# Patient Record
Sex: Male | Born: 1970 | Race: White | Hispanic: No | Marital: Married | Smoking: Current every day smoker
Health system: Southern US, Community
[De-identification: ages and names within clinical notes are randomized; demographics above are authoritative.]

## PROBLEM LIST (undated history)

## (undated) DIAGNOSIS — E785 Hyperlipidemia, unspecified: Secondary | ICD-10-CM

## (undated) DIAGNOSIS — J45909 Unspecified asthma, uncomplicated: Secondary | ICD-10-CM

## (undated) DIAGNOSIS — I10 Essential (primary) hypertension: Secondary | ICD-10-CM

## (undated) DIAGNOSIS — K219 Gastro-esophageal reflux disease without esophagitis: Secondary | ICD-10-CM

## (undated) DIAGNOSIS — S0590XA Unspecified injury of unspecified eye and orbit, initial encounter: Secondary | ICD-10-CM

## (undated) DIAGNOSIS — I252 Old myocardial infarction: Secondary | ICD-10-CM

## (undated) DIAGNOSIS — I2510 Atherosclerotic heart disease of native coronary artery without angina pectoris: Secondary | ICD-10-CM

## (undated) DIAGNOSIS — Z720 Tobacco use: Secondary | ICD-10-CM

## (undated) DIAGNOSIS — F1410 Cocaine abuse, uncomplicated: Secondary | ICD-10-CM

## (undated) DIAGNOSIS — J449 Chronic obstructive pulmonary disease, unspecified: Secondary | ICD-10-CM

## (undated) HISTORY — DX: Atherosclerotic heart disease of native coronary artery without angina pectoris: I25.10

## (undated) HISTORY — DX: Hyperlipidemia, unspecified: E78.5

## (undated) HISTORY — DX: Tobacco use: Z72.0

## (undated) HISTORY — DX: Essential (primary) hypertension: I10

## (undated) HISTORY — PX: EYE SURGERY: SHX253

## (undated) HISTORY — PX: KNEE ARTHROSCOPY: SUR90

## (undated) HISTORY — DX: Cocaine abuse, uncomplicated: F14.10

## (undated) HISTORY — DX: Gastro-esophageal reflux disease without esophagitis: K21.9

## (undated) HISTORY — DX: Unspecified injury of unspecified eye and orbit, initial encounter: S05.90XA

---

## 1990-06-26 HISTORY — PX: NISSEN FUNDOPLICATION: SHX2091

## 1998-07-19 ENCOUNTER — Emergency Department (HOSPITAL_COMMUNITY): Admission: EM | Admit: 1998-07-19 | Discharge: 1998-07-19 | Payer: Self-pay | Admitting: Emergency Medicine

## 2006-02-12 ENCOUNTER — Ambulatory Visit: Payer: Self-pay | Admitting: Cardiology

## 2006-02-12 ENCOUNTER — Observation Stay (HOSPITAL_COMMUNITY): Admission: EM | Admit: 2006-02-12 | Discharge: 2006-02-13 | Payer: Self-pay | Admitting: Emergency Medicine

## 2006-02-14 ENCOUNTER — Ambulatory Visit: Payer: Self-pay | Admitting: Cardiology

## 2006-06-14 ENCOUNTER — Ambulatory Visit: Payer: Self-pay | Admitting: Cardiology

## 2007-05-21 ENCOUNTER — Emergency Department (HOSPITAL_COMMUNITY): Admission: EM | Admit: 2007-05-21 | Discharge: 2007-05-21 | Payer: Self-pay | Admitting: Emergency Medicine

## 2007-06-14 ENCOUNTER — Encounter (HOSPITAL_COMMUNITY): Admission: RE | Admit: 2007-06-14 | Discharge: 2007-06-26 | Payer: Self-pay | Admitting: Orthopaedic Surgery

## 2007-06-27 HISTORY — PX: APPENDECTOMY: SHX54

## 2007-07-30 ENCOUNTER — Ambulatory Visit (HOSPITAL_COMMUNITY): Admission: RE | Admit: 2007-07-30 | Discharge: 2007-07-30 | Payer: Self-pay | Admitting: Orthopaedic Surgery

## 2007-09-18 ENCOUNTER — Emergency Department (HOSPITAL_COMMUNITY): Admission: EM | Admit: 2007-09-18 | Discharge: 2007-09-18 | Payer: Self-pay | Admitting: Emergency Medicine

## 2008-01-11 ENCOUNTER — Emergency Department (HOSPITAL_COMMUNITY): Admission: EM | Admit: 2008-01-11 | Discharge: 2008-01-11 | Payer: Self-pay | Admitting: Emergency Medicine

## 2009-06-26 HISTORY — PX: CORONARY STENT PLACEMENT: SHX1402

## 2009-11-10 DIAGNOSIS — I2510 Atherosclerotic heart disease of native coronary artery without angina pectoris: Secondary | ICD-10-CM

## 2009-11-10 HISTORY — DX: Atherosclerotic heart disease of native coronary artery without angina pectoris: I25.10

## 2010-11-11 NOTE — H&P (Signed)
NAMEISSA, KOSMICKI             ACCOUNT NO.:  [id number]   MEDICAL RECORD NO.:  [id number]          PATIENT TYPE:  INP   LOCATION:  A201                          FACILITY:  [facility]   PHYSICIAN:  Margaretmary Dys, M.D.DATE OF BIRTH:  1970-10-11   DATE OF ADMISSION:  02/12/2006  DATE OF DISCHARGE:  LH                                HISTORY & PHYSICAL   PRIMARY CARE PHYSICIAN:  The patient is unassigned.   ADMISSION DIAGNOSES:  1. Chest pain, rule out myocardial infarction.  2. Tobacco abuse.  3. Probable chronic obstructive pulmonary disease.   CHIEF COMPLAINT:  Chest tightness over the past 3 days.   HISTORY OF PRESENT ILLNESS:  Ms. Handshoe is a 40 year old Caucasian male who  was been admitted through the emergency room after he presented with chest  pain.  The patient describes some chest tightness that is pressure-like,  like someone sitting on his chest, which he rates as a 10/10.  The onset was  about 3 years ago and he was at the time working on a roof.  The patient  works in [employer].  The patient said it was a little difficult for him  to take a deep breath as symptoms were worse.  He denies any wheezing but  has had occasional wheezing in the past.  The patient reports having a fair  amount of cough in the morning almost daily, productive of sputum,  occasionally white and greenish sputum.  He denies any blood.  He has had no  fever or chills.   He denies any diaphoresis.  His pain is nonradiating.  He has had no nausea  or vomiting.  He denies any heartburn-like symptoms.  Initial evaluation in  the emergency room was really unremarkable, but there are concerns due to  his strong family history, and the patient is now being admitted.  He does  also have a fair amount of smoking history.  The patient told me that he may  have been told in the past that he had COPD but never did anything about it.  He was never prescribed inhalers.  The patient is now being admitted  for the  evaluation and a stress test and, if negative, probably pulmonary function  tests to rule out the role of COPD in all of this.   REVIEW OF SYSTEMS:  A 10-point review of systems is otherwise negative  except as mentioned in the history of present illness.   PAST MEDICAL HISTORY:  1. Tobacco abuse.  2. Questionable diagnosis of COPD some years ago.   MEDICATIONS:  None.   ALLERGIES:  No known drug allergies.   FAMILY HISTORY:  Fairly positive.  Father had an MI in his 35s.  He also has  a brother who also had a myocardial infarction.  There is also a family  history of hypertension and coronary artery disease in the grandmother also  at an early age, in her 50s.   SOCIAL HISTORY:  The patient is divorced, has 3 children.  He lives with his  fiancee.  He is independent of his activities of daily living.  He works in  [employer].  He smokes about 2 packs of cigarettes a day and also  occasional marijuana.  The patient denies any further illicit drug use,  including cocaine or IV drug abuse.   PHYSICAL EXAMINATION:  GENERAL:  The patient is alert, comfortable, not in  acute distress.  VITAL SIGNS:  Blood pressure was 146/88, pulse of 72, respirations was 20, T-  max 97.9.  Oxygen saturation was 98% on room air.  HEENT:  Normocephalic, atraumatic.  Oral mucosa was moist without exudate.  NECK:  Supple, no JVD or lymphadenopathy.  LUNGS:  Clear clinically with good air entry bilaterally.  No crackles, no  wheezing, no rhonchi were heard.  CARDIAC:  S1, S2, regular.  No S3, S4, gallops or rubs.  ABDOMEN:  Soft, nontender, bowel sounds were positive.  No masses palpable.  EXTREMITIES:  No pitting pedal edema.  No calf induration or tenderness was  noted.  CENTRAL NERVOUS SYSTEM:  Grossly intact with no focal deficit.   LABORATORY/DIAGNOSTIC DATA:  Chest x-ray showed no acute findings.  A 12-  lead EKG was normal sinus rhythm with no acute ST-T changes.  White blood  cells  9.8, hemoglobin of 15.1, hematocrit 43.6, platelet count was 242 with  no left shift.  Sodium was 142, potassium of 4, chloride of 108, CO2 27,  glucose of 89, BUN of 11, creatinine was 1.0, AST of 19, ALT of 21.  Cardiac  enzymes were negative.   ASSESSMENT AND PLAN:  Ms. Gavin is a 40 year old Caucasian male with chest  pain.  His chest pain, I think, is a little atypical, and I think his  overall probability of this as angina is pretty low.  The patient reports a  strong family history and also a strong family history.  I still think the  probability is low.   Plans is to admit him.  We will cycle his enzymes, put him on telemetry.  We  will give him morphine p.r.n. for pain control.  Of note, the patient said  he received some nitroglycerin, which did not really help with the pressure.   I think the patient may also have a fair amount of chronic obstructive  pulmonary disease as evidenced by his daily cough and sputum production in  the mornings as well as his smoker's cough.  I am going to request  cardiology and if they think it is necessary, he may need a stress test.  If  the stress test is negative, I think he will need pulmonary function tests  to further define his lung condition.  I will put him on albuterol and  Atrovent nebulizer as needed.  If the patient remains mostly hemodynamically  stable, I will not fully anticoagulate him or start him on a nitroglycerin  infusion at this time, again as I think the overall probability is a little  low.  I have discussed the above plan with him and he verbalized full  understanding.      Margaretmary Dys, M.D.  Electronically Signed     AM/MEDQ  D:  02/13/2006  T:  02/13/2006  Job:  [id number]

## 2010-11-11 NOTE — Consult Note (Signed)
Bradley Hays, Bradley Hays             ACCOUNT NO.:  [id number]   MEDICAL RECORD NO.:  [id number]          PATIENT TYPE:  INP   LOCATION:  A201                          FACILITY:  [facility]   PHYSICIAN:  Gerrit Friends. Dietrich Pates, MD, FACCDATE OF BIRTH:  July 10, 1970   DATE OF CONSULTATION:  02/13/2006  DATE OF DISCHARGE:  02/13/2006                                   CONSULTATION   REFERRING PHYSICIAN:  Margaretmary Dys, M.D.   HISTORY OF PRESENT ILLNESS:  A 40 year old gentleman with generally good  health presents with chest discomfort.  Bradley Hays has had little contact  with physicians.  He has never been evaluated by cardiologist nor required  any cardiac testing.  He has been hospitalized only once for a  fundoplication for severe GERD.  He developed moderately-severe anterior  chest pressure at work yesterday, which persisted for number of hours  prompting presentation to [facility] emergency department from whence he was  admitted.  Initial cardiac markers and EKGs are negative.  There is no  radiation of his discomfort.  There is no associated dyspnea, diaphoresis  nor nausea.  There is no relationship to exertion.  He is unaware of  anything that causes improvement or exacerbates his symptoms.  There was no  response to nitroglycerin.   PAST MEDICAL HISTORY:  Is negative except as noted above.  He uses no  medications routinely.  He has no known allergies.  Bradley Hays reports a  productive cough for approximately 10 years.  He notes small amounts of  green sputum, typically in Bradley morning.  This improves during Bradley day.  He  has tried albuterol without benefit.   SOCIAL HISTORY:  Works in [employer]; is divorced with a new girlfriend  and is caring for multiple offspring of both partners.  Bradley patient does not  have health insurance.  He reports a significant amount of life stress but  does not feel that he reacts negatively to this.  He does smoke cigarettes.  He denies excessive use of  alcohol or use of drugs.   FAMILY HISTORY:  Positive for coronary disease - Bradley Hays has  had multiple myocardial infarctions.   REVIEW OF SYSTEMS:  Is generally negative.   EXAMINATION:  GENERAL:  Pleasant gentleman in no acute distress.  VITAL SIGNS:  Bradley blood pressure is 130/70, heart rate 80 and regular,  respirations 16, temperature 98.2.  HEENT:  Anicteric sclerae; pupils equal, round, react to light; normal oral  mucosa.  NECK:  No jugular venous distension; no carotid bruits.  LUNGS:  Minor inspiratory and expiratory rhonchi.  CARDIAC:  Split first heart sound versus fourth heart sound.  ABDOMEN:  Soft and nontender; no organomegaly.  EXTREMITIES:  No edema; distal pulses intact.  NEUROMUSCULAR:  Symmetric strength and tone; normal cranial nerves.   EKG:  Normal sinus rhythm; nondiagnostic inferior Q-waves; otherwise  unremarkable.  Chest x-ray:  Unremarkable.   IMPRESSION:  Bradley Hays presents with low cardiovascular risk and atypical  pleuritic chest discomfort.  In light of his history of severe  gastroesophageal reflux disease, an esophageal etiology for his  symptoms is  certainly a consideration; however, Bradley quality of discomfort is very  different than he has experienced in Bradley past.  Anxiety in Bradley face of  increased life stress is also a consideration.  I would recommend empiric  therapy for both of those conditions with low-dose benzodiazepines and a  PPI.  We will proceed with stress testing, which can be done on an  outpatient basis.   Bradley Hays has had a longstanding productive cough.  He is strongly  encouraged to discontinue use of tobacco products.  Pulmonary function tests  are warranted.  He should undergo a fiberoptic examination of his upper  airway.  If no etiology is apparent after that evaluation and symptoms  persist despite discontinuing use of tobacco products, consideration should  be given to referral to an ENT physician or a  pulmonologist.   I greatly appreciate Bradley request for consultation and will be happy to  follow this nice gentleman both in-hospital and subsequent to discharge.      Gerrit Friends. Dietrich Pates, MD, [facility]  Electronically Signed     RMR/MEDQ  D:  02/13/2006  T:  02/13/2006  Job:  [id number]

## 2010-11-11 NOTE — Group Therapy Note (Signed)
Bradley Hays, Bradley Hays             ACCOUNT NO.:  [id number]   MEDICAL RECORD NO.:  [id number]          PATIENT TYPE:  INP   LOCATION:  A201                          FACILITY:  [facility]   PHYSICIAN:  Hanley Hays. Dechurch, M.D.DATE OF BIRTH:  1970/07/17   DATE OF PROCEDURE:  DATE OF DISCHARGE:  02/13/2006                                   PROGRESS NOTE   DIAGNOSES:  1. Atypical chest pain.  2. Gastroesophageal reflux.  3. Tobacco abuse.   DISPOSITION:  The patient is being discharged to home.  Follow up exercise  stress testing in a.m.   MEDICATIONS:  1. Aspirin 81 mg daily.  2. Prilosec OTC 20 mg daily.   Smoking cessation recommended.   HOSPITAL COURSE:  A 40 year old Caucasian male who presented to the  emergency room with chest pressure which was made worse with deep breathing.  He was fairly anxious.  Apparently, his pain was not improved with  nitroglycerin, but did improve with morphine.  The patient states he did not  like the way the morphine made him feel.  In any event, he was admitted to  the hospital for rule out MI.  There was no EKG changes noted.  The patient  had normal cardiac enzymes.  He was seen in consultation by cardiology,  [facility], who planned for exercise stress testing in the a.m.  They felt that his pain was unlikely to be ischemic in nature.  The patient  did have one dose of Ativan and had much improved pain and overall felt much  better.  He was counseled on smoking cessation.  He will be discharged to  home with a followup as noted above.  He is to return to work once cleared  by cardiology, likely after stress test.  The patient was also given phone  numbers regarding physicians taking new patients in this area, as well as  regarding primary care physicians in this area;  however, the patient is  from [place], it was recommended that he seek some primary care followup.  Given the fact he complains of cough and tobacco abuse, he would  benefits  from PFTs as well.  It is felt that these can be deferred to be done as an  outpatient.      Hanley Hays Josefine Class, M.D.  Electronically Signed     FED/MEDQ  D:  02/13/2006  T:  02/13/2006  Job:  [id number]

## 2010-11-17 ENCOUNTER — Emergency Department (HOSPITAL_COMMUNITY)
Admission: EM | Admit: 2010-11-17 | Discharge: 2010-11-17 | Disposition: A | Discharge status: Home / Self Care | Payer: Self-pay | Attending: Emergency Medicine | Admitting: Emergency Medicine

## 2010-11-17 ENCOUNTER — Emergency Department (HOSPITAL_COMMUNITY): Payer: Self-pay

## 2010-11-17 DIAGNOSIS — M25519 Pain in unspecified shoulder: Secondary | ICD-10-CM | POA: Insufficient documentation

## 2010-11-17 DIAGNOSIS — I252 Old myocardial infarction: Secondary | ICD-10-CM | POA: Insufficient documentation

## 2010-11-17 DIAGNOSIS — IMO0002 Reserved for concepts with insufficient information to code with codable children: Secondary | ICD-10-CM | POA: Insufficient documentation

## 2010-11-17 DIAGNOSIS — X500XXA Overexertion from strenuous movement or load, initial encounter: Secondary | ICD-10-CM | POA: Insufficient documentation

## 2010-12-19 ENCOUNTER — Emergency Department (HOSPITAL_COMMUNITY)
Admission: EM | Admit: 2010-12-19 | Discharge: 2010-12-19 | Disposition: A | Discharge status: Home / Self Care | Payer: Medicaid Other | Attending: Emergency Medicine | Admitting: Emergency Medicine

## 2010-12-19 ENCOUNTER — Emergency Department (HOSPITAL_COMMUNITY): Payer: Medicaid Other

## 2010-12-19 DIAGNOSIS — W11XXXA Fall on and from ladder, initial encounter: Secondary | ICD-10-CM | POA: Insufficient documentation

## 2010-12-19 DIAGNOSIS — S6390XA Sprain of unspecified part of unspecified wrist and hand, initial encounter: Secondary | ICD-10-CM | POA: Insufficient documentation

## 2010-12-19 DIAGNOSIS — S52609A Unspecified fracture of lower end of unspecified ulna, initial encounter for closed fracture: Secondary | ICD-10-CM | POA: Insufficient documentation

## 2010-12-22 ENCOUNTER — Ambulatory Visit (INDEPENDENT_AMBULATORY_CARE_PROVIDER_SITE_OTHER): Payer: Self-pay | Admitting: Orthopedic Surgery

## 2010-12-22 ENCOUNTER — Encounter: Payer: Self-pay | Admitting: Orthopedic Surgery

## 2010-12-22 DIAGNOSIS — S63509A Unspecified sprain of unspecified wrist, initial encounter: Secondary | ICD-10-CM

## 2010-12-22 DIAGNOSIS — S63619A Unspecified sprain of unspecified finger, initial encounter: Secondary | ICD-10-CM

## 2010-12-22 DIAGNOSIS — S6390XA Sprain of unspecified part of unspecified wrist and hand, initial encounter: Secondary | ICD-10-CM

## 2010-12-22 MED ORDER — HYDROCODONE-ACETAMINOPHEN 5-325 MG PO TABS: 1.0000 | ORAL_TABLET | ORAL | Status: AC | PRN

## 2010-12-22 NOTE — Patient Instructions (Signed)
Wear splint and brace come back in 3 week s

## 2010-12-22 NOTE — Progress Notes (Signed)
New patient  40 years old right-hand dominant RIGHT hand caught in a lateral wrong in the lower rung broke.  Complains of a volar wrist pain and pain at the PIP joint of the long finger x-rays negative except for small ossicle ulnar side of the wrist joint at the ulnar carpal joint but is not having any pain there  History as recorded  Review of systems  Examination Vital signs are stable as recorded  General appearance is normal  The patient is alert and oriented x3  The patient's mood and affect are normal  The patient is ambulating with a normal gait pattern  The cardiovascular exam reveals normal pulses and temperature without edema swelling.  The lymphatic system is negative for palpable lymph nodes  The sensory exam is normal.  There are no pathologic reflexes.  Balance is normal.  Wrist exam he is nontender except over the volar side of the wrist near the radial side.  There is questionable tenderness over the scaphoid tubercle.  He is tender at the PIP joint of the long finger.  I can flex it about 25 and he complains of severe pain.  He has painful wrist extension as well as flexion.  No instability no malalignment.  I resplinted him in flexion of about 20 placed a splint on his wrist and asked him to come back for followup examination.  Diagnosis #1 sprain wrist Diagnosis #2 sprain PIP joint

## 2011-01-16 ENCOUNTER — Telehealth: Payer: Self-pay | Admitting: Orthopedic Surgery

## 2011-01-16 NOTE — Telephone Encounter (Signed)
Patient called to relay that wrist brace from the emergency room is rubbing badly; states he has put on an ace bandage instead. His regular appointment is tomorrow, 01/17/11.  If any further advice, please call [phone].

## 2011-01-16 NOTE — Telephone Encounter (Signed)
ok 

## 2011-01-16 NOTE — Telephone Encounter (Signed)
I called patient, he took removable splint off of his hand/wrist last week because it was uncomfortable, he put on ace bandage instead, he called basically because he wanted to see if he could come in for appt today instead of tomorrow, I advised to keep appt tomorrow.

## 2011-01-17 ENCOUNTER — Ambulatory Visit (INDEPENDENT_AMBULATORY_CARE_PROVIDER_SITE_OTHER): Payer: Medicaid Other | Admitting: Orthopedic Surgery

## 2011-01-17 DIAGNOSIS — S59909A Unspecified injury of unspecified elbow, initial encounter: Secondary | ICD-10-CM

## 2011-01-17 DIAGNOSIS — S6991XA Unspecified injury of right wrist, hand and finger(s), initial encounter: Secondary | ICD-10-CM

## 2011-01-17 MED ORDER — HYDROCODONE-ACETAMINOPHEN 5-325 MG PO TABS: 1.0000 | ORAL_TABLET | ORAL | Status: AC | PRN

## 2011-01-17 NOTE — Progress Notes (Signed)
   Followup visit:  Previous history 40 years old right-hand dominant RIGHT hand caught in a lateral wrong in the lower rung broke. Complains of a volar wrist pain and pain at the PIP joint of the long finger x-rays negative except for small ossicle ulnar side of the wrist joint at the ulnar carpal joint but is not having any pain there  Still complains of pain on the radial side of the wrist at the FCR insertion. Although the finger is still swollen. He has regained full range of motion.  Exam shows tenderness over the insertion of the flexor carpi radialis tendon. He also has some mild tenderness over the wrist joint.  He has painful range of motion of flexion of the wrist, which is 30.  He has a positive Tinel sign over the volar aspect of the radial side of the wrist joint.  Recommend short arm cast for 3 more weeks. If no improvement. An MRI RIGHT wrist.  Short arm cast applied

## 2011-01-17 NOTE — Patient Instructions (Signed)
Keep  Cast dry   Do not get wet   If it gets wet dry with a hair dryer on low setting and call the office   

## 2011-01-19 ENCOUNTER — Encounter: Payer: Self-pay | Admitting: Orthopedic Surgery

## 2011-01-19 ENCOUNTER — Ambulatory Visit (INDEPENDENT_AMBULATORY_CARE_PROVIDER_SITE_OTHER): Payer: Medicaid Other | Admitting: Orthopedic Surgery

## 2011-01-19 DIAGNOSIS — Z4789 Encounter for other orthopedic aftercare: Secondary | ICD-10-CM | POA: Insufficient documentation

## 2011-01-19 NOTE — Progress Notes (Signed)
Status post cast application 2 days ago for continued pain RIGHT wrist after trauma  The patient came in with cast discomfort  Recommend Cast off.  There were 2 areas of rubbing.  New cast applied.  The cast is actually helped Return in balance of 6 weeks as Stated.  He previously

## 2011-01-19 NOTE — Patient Instructions (Signed)
Continue splint.  Continue pain medication.  Return as previously scheduled her for reevaluation.  If not better we will order MRI

## 2011-01-20 ENCOUNTER — Ambulatory Visit (INDEPENDENT_AMBULATORY_CARE_PROVIDER_SITE_OTHER): Payer: Medicaid Other | Admitting: Cardiology

## 2011-01-20 ENCOUNTER — Encounter: Payer: Self-pay | Admitting: Cardiology

## 2011-01-20 VITALS — BP 130/85 | HR 108 | Ht 71.0 in | Wt 209.0 lb

## 2011-01-20 DIAGNOSIS — F1410 Cocaine abuse, uncomplicated: Secondary | ICD-10-CM

## 2011-01-20 DIAGNOSIS — E785 Hyperlipidemia, unspecified: Secondary | ICD-10-CM

## 2011-01-20 DIAGNOSIS — Z4789 Encounter for other orthopedic aftercare: Secondary | ICD-10-CM

## 2011-01-20 DIAGNOSIS — E782 Mixed hyperlipidemia: Secondary | ICD-10-CM

## 2011-01-20 DIAGNOSIS — I10 Essential (primary) hypertension: Secondary | ICD-10-CM

## 2011-01-20 DIAGNOSIS — K219 Gastro-esophageal reflux disease without esophagitis: Secondary | ICD-10-CM

## 2011-01-20 DIAGNOSIS — I2510 Atherosclerotic heart disease of native coronary artery without angina pectoris: Secondary | ICD-10-CM

## 2011-01-20 DIAGNOSIS — S0590XA Unspecified injury of unspecified eye and orbit, initial encounter: Secondary | ICD-10-CM

## 2011-01-20 DIAGNOSIS — Z720 Tobacco use: Secondary | ICD-10-CM

## 2011-01-20 MED ORDER — LORAZEPAM 0.5 MG PO TABS: 0.5000 mg | ORAL_TABLET | Freq: Three times a day (TID) | ORAL | Status: AC | PRN

## 2011-01-20 MED ORDER — ATORVASTATIN CALCIUM 40 MG PO TABS: 40.0000 mg | ORAL_TABLET | Freq: Every day | ORAL | Status: DC

## 2011-01-20 NOTE — Patient Instructions (Signed)
Your physician has recommended you make the following change in your medication: start taking Lipitor 40 mg at bedtime, Ativan 0.5 mg (1/2 to 1 tablet) three times daily and at bedtime as needed, decrease Aspirin 81 mg daily  Your physician discussed the hazards of tobacco use. Tobacco use cessation is recommended and techniques and options to help you quit were discussed.   Your physician recommends that you schedule a follow-up appointment in: 1 month

## 2011-01-22 ENCOUNTER — Encounter: Payer: Self-pay | Admitting: Cardiology

## 2011-01-22 DIAGNOSIS — K219 Gastro-esophageal reflux disease without esophagitis: Secondary | ICD-10-CM | POA: Insufficient documentation

## 2011-01-22 DIAGNOSIS — S0590XA Unspecified injury of unspecified eye and orbit, initial encounter: Secondary | ICD-10-CM | POA: Insufficient documentation

## 2011-01-22 DIAGNOSIS — Z720 Tobacco use: Secondary | ICD-10-CM | POA: Insufficient documentation

## 2011-01-22 DIAGNOSIS — I10 Essential (primary) hypertension: Secondary | ICD-10-CM | POA: Insufficient documentation

## 2011-01-22 DIAGNOSIS — F1410 Cocaine abuse, uncomplicated: Secondary | ICD-10-CM | POA: Insufficient documentation

## 2011-01-22 DIAGNOSIS — E785 Hyperlipidemia, unspecified: Secondary | ICD-10-CM | POA: Insufficient documentation

## 2011-01-22 NOTE — Assessment & Plan Note (Signed)
Blood pressure is good; hypertension is likely mild.  Additional medication will be prescribed as needed.

## 2011-01-22 NOTE — Assessment & Plan Note (Signed)
Patient reports marked exercise intolerance and severe dyspnea despite a recent negative stress echo.  Chronic obstructive pulmonary disease does not appear to be severe.  Symptoms likely reflect physical deconditioning, but additional testing, including pulmonary function studies, may be necessary.  He is encouraged to increase activity as tolerated.

## 2011-01-22 NOTE — Assessment & Plan Note (Addendum)
Patient reports substantial anxiety and major life stress.  He does not believe that he can discontinue cigarette smoking with this level of anxiety.  He has difficulty falling asleep and middle of the night awakening as well.  Ativan will be utilized to treat anxiety and insomnia, which may permit an attempt quit tobacco use in the near future.  Return visits will be scheduled at short intervals until patient is stable and treatment is optimal.

## 2011-01-22 NOTE — Assessment & Plan Note (Addendum)
Hyperlipidemia has been uncontrolled, as patient has not complied with recommended medications.  He recently has acquired Medicaid coverage and thus is reestablishing appropriate medical care.  Treatment with a statin will be initiated and a repeat lipid profile obtained.

## 2011-01-22 NOTE — Progress Notes (Signed)
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  Since an admission to [facility] 3 months ago for chest pain at which time MI was ruled out and a CT of the chest and stress echocardiogram were negative, he has experienced no acute exacerbation of symptoms.  He denies recent drug use.  Activity is markedly limited by exertional dyspnea.  Patient recently fell from a ladder resulting in a fracture of the right forearm.  Extensive records were obtained from [facility] and reviewed.  Current Outpatient Prescriptions on File Prior to Visit  Medication Sig Dispense Refill  . atorvastatin (LIPITOR) 40 MG tablet Take 1 tablet (40 mg total) by mouth at bedtime.  30 tablet  3  . HYDROcodone-acetaminophen (NORCO) 5-325 MG per tablet Take 1 tablet by mouth every 4 (four) hours as needed for pain.  60 tablet  2  . ibuprofen (ADVIL,MOTRIN) 800 MG tablet Take 800 mg by mouth daily.         No Known Allergies    Past Medical History  Diagnosis Date  . 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 Hypertension   . Hyperlipidemia   . Tobacco abuse     40 pack years; 0.5 PPD  . Gastroesophageal reflux disease   . Cocaine abuse   . Trauma to eye     Retained projectile on left     Past Surgical History  Procedure Date  . Appendectomy   . Knee arthroscopy      Family History  Problem Relation Age of Onset  . Heart disease    . Arthritis    . Lung disease    . Cancer    . Asthma    . Kidney  disease       History   Social History  . Marital Status: Married    Spouse Name: N/A    Number of Children: N/A  . Years of Education: N/A   Occupational History  . Not on file.   Social History Main Topics  . Smoking status: Current Everyday Smoker -- 0.5 packs/day    Types: Cigarettes  . Smokeless tobacco: Never Used  . Alcohol Use: Yes  . Drug Use: No  . Sexually Active: Not on file   Other Topics Concern  . Not on file   Social History Narrative  . No narrative on file     ROS:  Requires corrective lenses; intermittent mild palpitations; stable weight and appetite.  All other systems reviewed and are negative.  PHYSICAL EXAM: BP 130/85  Pulse 108  Ht 5\' 11"  (1.803 m)  Wt 94.802 kg (209 lb)  BMI 29.15 kg/m2  SpO2 98%  General-Well-developed; no acute distress Body Habitus-proportionate weight and height HEENT-[place]/AT; PERRL; EOM intact; conjunctiva and lids nl Neck-No JVD; no carotid bruits Endocrine-No thyromegaly Lungs-Clear lung fields; resonant percussion; normal I-to-E ratio Cardiovascular- normal PMI; normal S1 and S2 Abdomen-BS normal; soft and non-tender without masses or organomegaly Musculoskeletal-No deformities, cyanosis or clubbing Neurologic-Nl cranial nerves; symmetric strength  and tone Skin- Warm, no significant lesions Extremities-decreased left distal pulses, including absent left dorsalis pedis by Doppler; no edema; cast on the right arm  EKG:   Sinus tachycardia rate of 106; incomplete right bundle branch block; nondiagnostic inferior Q waves with slight ST segment elevation and T-wave inversion; early R wave progression-possible prior inferior-posterior MI;  Comparison with prior EKG performed 10/04/10: T-Wave inversion more prominent in the inferior leads on the present tracing  Laboratory:  Normal complete metabolic profile and CBC in 09/2010; abnormal lipids: Total cholesterol of 311 and triglycerides of 1730.  ASSESSMENT AND PLAN:

## 2011-01-23 ENCOUNTER — Telehealth: Payer: Self-pay | Admitting: Cardiology

## 2011-01-23 NOTE — Telephone Encounter (Signed)
S:pt was seen last week 01/20/11 B: apparrently has issues with

## 2011-01-23 NOTE — Telephone Encounter (Signed)
Patient would like to speak with nurse regarding medications. / tg  °

## 2011-01-24 ENCOUNTER — Telehealth: Payer: Self-pay | Admitting: Orthopedic Surgery

## 2011-01-24 NOTE — Telephone Encounter (Signed)
No meds are to be filled early,and why did he not call his pharmacy to get med request faxed to [place]?

## 2011-01-24 NOTE — Telephone Encounter (Signed)
Patient had called pharmacy prior to calling us; said he was advised to contact our office.  I called patient back; he's asked [facility] to fax [place] a request.  Advised no early refills at this time.

## 2011-01-24 NOTE — Telephone Encounter (Signed)
01/23/11 Pt called our office with c/o that ativan was not working He was asked to increase to 2 tablets three times daily as needed for his anxiety and insomnia, per Dr. Dietrich Pates But he was not at all please with this idea he would like to be on xanax.

## 2011-01-24 NOTE — Telephone Encounter (Signed)
Patient called to request refill on medication, Hydrocodone(Norco)5/325, 1 tablet every 4 hours, pharmacy: [facility].  He is with his son, who is in-patient at [facility] and son is about to be transported to [place].  He states he will have no transportation to get to a pharmacy once he is in [place].  He said he is aware that refill is 3 days early. Asked to please call at  [facility], patient room phone # [phone].

## 2011-01-26 NOTE — Telephone Encounter (Signed)
Pt was also advised that treatment of anxiety would be most appropriately managed by his PCP in the future.

## 2011-02-02 NOTE — Telephone Encounter (Signed)
No further response back from patient. Note appointment scheduled.

## 2011-02-07 ENCOUNTER — Ambulatory Visit: Payer: Medicaid Other | Admitting: Orthopedic Surgery

## 2011-02-10 ENCOUNTER — Ambulatory Visit: Payer: Medicaid Other | Admitting: Cardiology

## 2011-02-13 ENCOUNTER — Encounter: Payer: Self-pay | Admitting: Orthopedic Surgery

## 2011-02-13 ENCOUNTER — Ambulatory Visit (INDEPENDENT_AMBULATORY_CARE_PROVIDER_SITE_OTHER): Payer: Medicaid Other | Admitting: Orthopedic Surgery

## 2011-02-13 DIAGNOSIS — S5290XD Unspecified fracture of unspecified forearm, subsequent encounter for closed fracture with routine healing: Secondary | ICD-10-CM

## 2011-02-13 DIAGNOSIS — S62109G Fracture of unspecified carpal bone, unspecified wrist, subsequent encounter for fracture with delayed healing: Secondary | ICD-10-CM

## 2011-02-13 DIAGNOSIS — M25539 Pain in unspecified wrist: Secondary | ICD-10-CM

## 2011-02-13 DIAGNOSIS — S63519A Sprain of carpal joint of unspecified wrist, initial encounter: Secondary | ICD-10-CM

## 2011-02-13 DIAGNOSIS — S63521A Sprain of radiocarpal joint of right wrist, initial encounter: Secondary | ICD-10-CM

## 2011-02-13 DIAGNOSIS — S63529A Sprain of radiocarpal joint of unspecified wrist, initial encounter: Secondary | ICD-10-CM

## 2011-02-13 MED ORDER — HYDROCODONE-ACETAMINOPHEN 5-325 MG PO TABS: 1.0000 | ORAL_TABLET | Freq: Four times a day (QID) | ORAL | Status: AC | PRN

## 2011-02-13 NOTE — Patient Instructions (Addendum)
Ok to wear wrist brace until testing has been completed   Note for work: wear brace right wrist / no lifting over 5 lbs

## 2011-02-13 NOTE — Progress Notes (Signed)
Followup visit:  Previous history: 40 years old right-hand dominant RIGHT hand caught in a lateral wrung and the lower rung broke.  DOI July 2012  x-rays negative except for small ossicle ulnar side of the wrist joint at the ulnar carpal joint but he is not having any pain there   Still complains of pain on the radial side of the wrist at the FCR insertion. Although the finger is still swollen. He has regained full range of motion. Treated with cast immobilization but still c/o pain in the wrist.  The patient is awake alert and oriented x3.  His mood and affect are normal.  He has no deformity to the wrist.  He is tender over the flexor carpi radialis tendon and also over the trapezius and trapezium and radiocarpal joint.  He has painful power grip.  Diagnosis fractured carpal bone Diagnosis sprain joint radiocarpal Diagnosis sprain intercarpal joint   Rec CT and wrist arthrogram of the wrist to look for occult wrist fracture and or ligament injury

## 2011-02-16 ENCOUNTER — Ambulatory Visit: Payer: Medicaid Other | Admitting: Orthopedic Surgery

## 2011-02-16 ENCOUNTER — Encounter: Payer: Self-pay | Admitting: Cardiology

## 2011-02-20 ENCOUNTER — Telehealth: Payer: Self-pay | Admitting: *Deleted

## 2011-02-20 NOTE — Telephone Encounter (Signed)
Called Medicaid to get precert for CT scan, case number is [id number], per Modena Jansky needs clinical review, will get response by Tuesday afternoon.

## 2011-02-28 ENCOUNTER — Emergency Department (HOSPITAL_COMMUNITY)
Admission: EM | Admit: 2011-02-28 | Discharge: 2011-02-28 | Disposition: A | Discharge status: Home / Self Care | Payer: Medicaid Other | Attending: Emergency Medicine | Admitting: Emergency Medicine

## 2011-02-28 ENCOUNTER — Emergency Department (HOSPITAL_COMMUNITY): Payer: Medicaid Other

## 2011-02-28 ENCOUNTER — Other Ambulatory Visit: Payer: Self-pay

## 2011-02-28 ENCOUNTER — Encounter (HOSPITAL_COMMUNITY): Payer: Self-pay | Admitting: Emergency Medicine

## 2011-02-28 DIAGNOSIS — I10 Essential (primary) hypertension: Secondary | ICD-10-CM | POA: Insufficient documentation

## 2011-02-28 DIAGNOSIS — I252 Old myocardial infarction: Secondary | ICD-10-CM | POA: Insufficient documentation

## 2011-02-28 DIAGNOSIS — M545 Low back pain, unspecified: Secondary | ICD-10-CM | POA: Insufficient documentation

## 2011-02-28 DIAGNOSIS — K219 Gastro-esophageal reflux disease without esophagitis: Secondary | ICD-10-CM | POA: Insufficient documentation

## 2011-02-28 DIAGNOSIS — E785 Hyperlipidemia, unspecified: Secondary | ICD-10-CM | POA: Insufficient documentation

## 2011-02-28 DIAGNOSIS — J40 Bronchitis, not specified as acute or chronic: Secondary | ICD-10-CM | POA: Insufficient documentation

## 2011-02-28 DIAGNOSIS — F17200 Nicotine dependence, unspecified, uncomplicated: Secondary | ICD-10-CM | POA: Insufficient documentation

## 2011-02-28 DIAGNOSIS — I2510 Atherosclerotic heart disease of native coronary artery without angina pectoris: Secondary | ICD-10-CM | POA: Insufficient documentation

## 2011-02-28 DIAGNOSIS — F1410 Cocaine abuse, uncomplicated: Secondary | ICD-10-CM | POA: Insufficient documentation

## 2011-02-28 MED ORDER — HYDROCODONE-ACETAMINOPHEN 7.5-500 MG/15ML PO SOLN
10.0000 mL | Freq: Once | ORAL | Status: AC
  Administered 2011-02-28: 10 mL via ORAL
  Filled 2011-02-28: qty 15

## 2011-02-28 MED ORDER — BENZONATATE 200 MG PO CAPS: 200.0000 mg | ORAL_CAPSULE | Freq: Two times a day (BID) | ORAL | Status: AC | PRN

## 2011-02-28 MED ORDER — HYDROCODONE-ACETAMINOPHEN 7.5-500 MG/15ML PO SOLN: 10.0000 mL | ORAL | Status: AC | PRN

## 2011-02-28 NOTE — ED Notes (Signed)
Pt c/o cough x 3-4 days and fell on a dock Friday and c/o lower back pain.

## 2011-02-28 NOTE — ED Notes (Signed)
Pt states has had dry,  Nagging, non-productive cough x 4 days which awakens him throughout the night. Pt reports coughing so much that his chest is sore from the cough. [place] which he scraped lt side at flank area and lower ribs. Pt states feels tender in that area.  6" long abrasion noted with no drainage/bleeding or s/s of infection.

## 2011-02-28 NOTE — ED Provider Notes (Signed)
History     CSN: [id number] Arrival date & time: 02/28/2011 11:05 AM  Chief Complaint  Patient presents with  . Cough  . Back Pain   Patient is a 40 y.o. male presenting with cough. The history is provided by the patient.  Cough This is a new problem. The current episode started more than 2 days ago. The problem occurs every few minutes. The problem has not changed since onset.The cough is non-productive. There has been no fever. Associated symptoms include chest pain. Pertinent negatives include no shortness of breath and no wheezing. Associated symptoms comments: [place] his left posterior ribs.  Since he has had a dry,  Nonproductive cough which has been persistent.  He has an abrasion on his side.  He denies sob and fever.  He does have pain at the site of the injury,  And increased pain with coughing.Marland Kitchen He has tried cough syrup for the symptoms. He is a smoker. His past medical history does not include bronchitis or pneumonia.    Past Medical History  Diagnosis Date  . 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 Hypertension   . Hyperlipidemia     lipid profile in 2011:185, 169, 37, 114; 2012: Total cholesterol of 311 and triglycerides of 1730  . Tobacco abuse     40 pack years; 0.5 PPD  . Gastroesophageal reflux disease   . Cocaine abuse   . 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 Myocardial infarction     Past Surgical History  Procedure Date  . Appendectomy   . Knee arthroscopy     Family History  Problem Relation Age of Onset  . Heart disease    . Arthritis    . Lung disease    . Cancer      . Asthma    . Kidney disease      History  Substance Use Topics  . Smoking status: Current Everyday Smoker -- 0.5 packs/day    Types: Cigarettes  . Smokeless tobacco: Never Used  . Alcohol Use: No      Review of Systems  Respiratory: Positive for cough. Negative for shortness of breath and wheezing.   Cardiovascular: Positive for chest pain.    Physical Exam  BP 151/93  Pulse 93  Temp(Src) 98.3 F (36.8 C) (Oral)  Resp 18  Ht 5\' 10"  (1.778 m)  Wt 210 lb (95.255 kg)  BMI 30.13 kg/m2  SpO2 100%  Physical Exam  Nursing note and vitals reviewed. Constitutional: He is oriented to person, place, and time. He appears well-developed and well-nourished.  HENT:  Head: Normocephalic and atraumatic.  Eyes: Conjunctivae are normal.  Neck: Normal range of motion.  Cardiovascular: Normal rate, regular rhythm, normal heart sounds and intact distal pulses.   Pulmonary/Chest: Effort normal and breath sounds normal. No accessory muscle usage. Not tachypneic. No respiratory distress. He has no wheezes. He has no rales.   He exhibits tenderness.  Abdominal: Soft. Bowel sounds are normal. There is no tenderness.  Musculoskeletal: Normal range of motion. He exhibits no edema.  Lumbar back: He exhibits tenderness.  Neurological: He is alert and oriented to person, place, and time.  Skin: Skin is warm and dry.     Psychiatric: He has a normal mood and affect.    ED Course  Procedures  MDM Chest wall pain with cough,  Normal cxr.  No results found for this or any previous visit. Dg Chest 2 View  02/28/2011  *RADIOLOGY REPORT*  Clinical Data: Cough  CHEST - 2 VIEW  Comparison: 02/12/2006  Findings: Lungs are clear. No pleural effusion or pneumothorax.  Cardiomediastinal silhouette is within normal limits.  Visualized osseous structures are within normal limits.  IMPRESSION: Normal chest radiographs.  Original Report Authenticated By: Charline Bills, M.D.   Dg Lumbar Spine  Complete  02/28/2011  *RADIOLOGY REPORT*  Clinical Data: Fall.  Left low back pain.  LUMBAR SPINE - COMPLETE 4+ VIEW  Comparison: 07/30/2007 [facility] CT.  Findings: Anterior wedging T12 vertebra appears remote.  Mild degenerative changes T11-T12 with anterior osteophyte.  Minimal L3-4 disc space narrowing.  Mild levoscoliosis.  IMPRESSION: Minimal to mild degenerative changes without acute fracture.  Original Report Authenticated By: Fuller Canada, M.D.         Date: 02/28/2011  Rate: 67  Rhythm: normal sinus rhythm  QRS Axis: normal  Intervals: normal  ST/T Wave abnormalities: normal  Conduction Disutrbances:none  Narrative Interpretation:   Old EKG Reviewed: none available    Candis Musa, PA 03/05/11 1534

## 2011-03-06 ENCOUNTER — Ambulatory Visit (INDEPENDENT_AMBULATORY_CARE_PROVIDER_SITE_OTHER): Payer: Medicaid Other | Admitting: Cardiology

## 2011-03-06 ENCOUNTER — Encounter: Payer: Self-pay | Admitting: Cardiology

## 2011-03-06 ENCOUNTER — Ambulatory Visit (HOSPITAL_COMMUNITY): Admit: 2011-03-06 | Payer: Medicaid Other

## 2011-03-06 VITALS — BP 154/86 | HR 92 | Ht 70.0 in | Wt 209.0 lb

## 2011-03-06 DIAGNOSIS — I10 Essential (primary) hypertension: Secondary | ICD-10-CM

## 2011-03-06 DIAGNOSIS — F1410 Cocaine abuse, uncomplicated: Secondary | ICD-10-CM

## 2011-03-06 DIAGNOSIS — I2510 Atherosclerotic heart disease of native coronary artery without angina pectoris: Secondary | ICD-10-CM

## 2011-03-06 MED ORDER — CHLORTHALIDONE 25 MG PO TABS: ORAL_TABLET | ORAL | Status: DC

## 2011-03-06 MED ORDER — METOPROLOL TARTRATE 50 MG PO TABS: ORAL_TABLET | ORAL | Status: DC

## 2011-03-06 MED ORDER — ALPRAZOLAM 0.5 MG PO TABS: ORAL_TABLET | ORAL | Status: DC

## 2011-03-06 NOTE — Progress Notes (Signed)
HPI : Mr. Word returns to the office as scheduled for continued assessment and treatment of coronary disease, cardiovascular risk factors and anxiety.  Since his last visit, he has done well.  He has acquired a primary care physician, but has not been assessing his blood pressures at home.  He has experienced no chest discomfort, lightheadedness nor syncope.  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  Ativan provided no relief from anxiety despite q.h.s doses as high as 2 mg.  He reports that Xanax has worked better for him in the past.  He was seen in the emergency department for bronchitis and given cough syrup with hydrocodone that had a beneficial effect on his chronic chest discomfort.  Current Outpatient Prescriptions on File Prior to Visit  Medication Sig Dispense Refill  . aspirin 325 MG EC tablet Take 325 mg by mouth daily.        . Aspirin-Acetaminophen-Caffeine (GOODY HEADACHE PO) Take 1 packet by mouth daily as needed. Pain       . atorvastatin (LIPITOR) 40 MG tablet Take 1 tablet (40 mg total) by mouth at bedtime.  30 tablet  3  . benzonatate (TESSALON) 200 MG capsule Take 1 capsule (200 mg total) by mouth 2 (two) times daily as needed for cough.  30 capsule  0  . HYDROcodone-acetaminophen (LORTAB) 7.5-500 MG/15ML solution Take 10 mLs by mouth every 4 (four) hours as needed for pain or cough.  120 mL  0  . ibuprofen (ADVIL,MOTRIN) 800 MG tablet Take 800 mg by mouth daily.          No Known Allergies    Past medical history, social history, and family history reviewed and updated.  ROS: Denies orthopnea, PND, lightheadedness or syncope.  Chronic fatigue and dyspnea on exertion that have improved.  Chronic vague mid substernal chest discomfort that is constant and unchanged.  PHYSICAL EXAM: BP 154/86  Pulse 92  Ht 5\' 10"  (1.778 m)  Wt 209 lb (94.802 kg)  BMI 29.99 kg/m2; repeat blood pressure 160/80 General-Well developed; no acute  distress Body habitus-Overweight Neck-No JVD; no carotid bruits Lungs-clear lung fields; resonant to percussion Cardiovascular-normal PMI; normal S1 and S2 Abdomen-normal bowel sounds; soft and non-tender without masses or organomegaly Musculoskeletal-No deformities, no cyanosis or clubbing Neurologic-Normal cranial nerves; symmetric strength and tone Skin-Warm, no significant lesions Extremities-distal pulses 1-2+; no edema  ASSESSMENT AND PLAN:

## 2011-03-06 NOTE — Assessment & Plan Note (Signed)
Blood pressure has been mildly elevated at both of this gentlemen's office visits.  He appears to require treatment, and beta blocker is a reasonable choice in view of the patient's coronary disease, but could exacerbate chronic obstructive pulmonary disease.  Initial use will be at low dose.  Patient cautioned to call for any deterioration in respiratory status.  Low-dose diuretic will be added, blood pressure followed and electrolytes and renal function assessed in one month.

## 2011-03-06 NOTE — Patient Instructions (Signed)
START METOPROLOL TART 25 MG TAKE 1/2 TABLET TWICE DAILY  START CHLORTHALIDONE 25 MG TAKE 1/2 TABLET ONCE DAILY  FASTING LAB WORK IN ONE WEEK  Your physician recommends that you return for lab work in: 3 WEEKS  Your physician recommends that you schedule a follow-up appointment in:  4-6 WEEKS  START XANAX 0.5 MG ONE OR TWO TABLETS THREE TIMES DAILY AS NEEDED FOR ANXIETY

## 2011-03-06 NOTE — Assessment & Plan Note (Signed)
Patient denies any recent use of illicit substances.

## 2011-03-06 NOTE — Progress Notes (Signed)
Call pharmacy to confirm refill order was sent & LVM on refill line.

## 2011-03-06 NOTE — Assessment & Plan Note (Signed)
Patient reports no symptoms to suggest recurrent myocardial ischemia.  Our focus will be on optimal control of cardiovascular risk factors.

## 2011-03-07 NOTE — ED Provider Notes (Signed)
Medical screening examination/treatment/procedure(s) were performed by non-physician practitioner and as supervising physician I was immediately available for consultation/collaboration.  Donnetta Hutching, MD 03/07/11 0010

## 2011-03-09 ENCOUNTER — Other Ambulatory Visit (HOSPITAL_COMMUNITY): Payer: Self-pay

## 2011-03-20 ENCOUNTER — Emergency Department (HOSPITAL_COMMUNITY): Payer: Medicaid Other

## 2011-03-20 ENCOUNTER — Emergency Department (HOSPITAL_COMMUNITY)
Admission: EM | Admit: 2011-03-20 | Discharge: 2011-03-20 | Disposition: A | Discharge status: Home / Self Care | Payer: Medicaid Other | Attending: Emergency Medicine | Admitting: Emergency Medicine

## 2011-03-20 ENCOUNTER — Encounter (HOSPITAL_COMMUNITY): Payer: Self-pay

## 2011-03-20 DIAGNOSIS — R05 Cough: Secondary | ICD-10-CM | POA: Insufficient documentation

## 2011-03-20 DIAGNOSIS — F1410 Cocaine abuse, uncomplicated: Secondary | ICD-10-CM | POA: Insufficient documentation

## 2011-03-20 DIAGNOSIS — R059 Cough, unspecified: Secondary | ICD-10-CM | POA: Insufficient documentation

## 2011-03-20 DIAGNOSIS — I2510 Atherosclerotic heart disease of native coronary artery without angina pectoris: Secondary | ICD-10-CM | POA: Insufficient documentation

## 2011-03-20 DIAGNOSIS — E785 Hyperlipidemia, unspecified: Secondary | ICD-10-CM | POA: Insufficient documentation

## 2011-03-20 DIAGNOSIS — I10 Essential (primary) hypertension: Secondary | ICD-10-CM | POA: Insufficient documentation

## 2011-03-20 DIAGNOSIS — F17200 Nicotine dependence, unspecified, uncomplicated: Secondary | ICD-10-CM | POA: Insufficient documentation

## 2011-03-20 DIAGNOSIS — J40 Bronchitis, not specified as acute or chronic: Secondary | ICD-10-CM

## 2011-03-20 MED ORDER — ACETAMINOPHEN-CODEINE 120-12 MG/5ML PO SOLN: 10.0000 mL | ORAL | Status: AC | PRN

## 2011-03-20 MED ORDER — ALBUTEROL SULFATE (5 MG/ML) 0.5% IN NEBU
2.5000 mg | INHALATION_SOLUTION | Freq: Once | RESPIRATORY_TRACT | Status: AC
  Administered 2011-03-20: 2.5 mg via RESPIRATORY_TRACT
  Filled 2011-03-20: qty 0.5

## 2011-03-20 MED ORDER — ACETAMINOPHEN-CODEINE 120-12 MG/5ML PO SOLN
10.0000 mL | ORAL | Status: DC | PRN
  Administered 2011-03-20: 10 mL via ORAL
  Filled 2011-03-20: qty 10

## 2011-03-20 NOTE — ED Notes (Signed)
Cough x 3 weeks, unable to sleep last 3 days

## 2011-03-20 NOTE — ED Provider Notes (Signed)
History     CSN: [id number] Arrival date & time: 03/20/2011  2:00 AM  Chief Complaint  Patient presents with  . Cough    x 3 weeks    HPI  (Consider location/radiation/quality/duration/timing/severity/associated sxs/prior treatment)  Patient is a 40 y.o. male presenting with cough.  Cough This is a recurrent problem. The problem occurs every few minutes. The problem has not changed since onset.Pertinent negatives include no chest pain, no chills, no headaches, no sore throat, no shortness of breath and no wheezing.   persistent dry cough in a patient who is a smoker and has been using albuterol inhaler at home. Denies any fever or chills. He denies any sick contacts at home. No productive sputum. She comes in tonight because he is unable to sleep due to the symptoms. No pain, specifically no chest pain. No leg swelling or redness. Severity is moderate to severe. Patient has had on-and-off cough for the last few weeks.  Past Medical History  Diagnosis Date  . 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 Hypertension   . Hyperlipidemia     lipid profile in 2011:185, 169, 37, 114; 2012: Total cholesterol of 311 and triglycerides of 1730  . Tobacco abuse     40 pack years; 0.5 PPD  . Gastroesophageal reflux disease   . Cocaine abuse   . Trauma to eye     Retained projectile on left    Past Surgical History  Procedure Date  . Appendectomy   . Knee arthroscopy     Family History  Problem Relation Age of Onset  . Heart disease    . Arthritis    . Lung disease    . Cancer    . Asthma    . Kidney disease      History  Substance Use Topics  . Smoking  status: Current Everyday Smoker -- 0.5 packs/day    Types: Cigarettes  . Smokeless tobacco: Never Used  . Alcohol Use: No      Review of Systems  Review of Systems  Constitutional: Negative for fever and chills.  HENT: Negative for sore throat, neck pain, neck stiffness, postnasal drip and sinus pressure.   Eyes: Negative for pain.  Respiratory: Positive for cough. Negative for chest tightness, shortness of breath and wheezing.   Cardiovascular: Negative for chest pain and leg swelling.  Gastrointestinal: Negative for abdominal pain.  Genitourinary: Negative for dysuria.  Musculoskeletal: Negative for back pain.  Skin: Negative for rash.  Neurological: Negative for headaches.  All other systems reviewed and are negative.    Allergies  Review of patient's allergies indicates no known allergies.  Home Medications   Current Outpatient Rx  Name Route Sig Dispense Refill  . PREDNISONE 10 MG PO TABS Oral Take 10 mg by mouth daily. Has been on x4 days     . ALBUTEROL SULFATE HFA IN Inhalation Inhale 2 puffs into the lungs as needed.      . ALPRAZOLAM 0.5 MG PO TABS  ONE OR TWO TABLETS THREE TIMES DAILY AS NEEDED FOR ANXIETY 60 tablet 1  . ASPIRIN 325 MG PO TBEC Oral Take 325 mg by mouth daily.      Marland Kitchen  GOODY HEADACHE PO Oral Take 1 packet by mouth daily as needed. Pain     . ATORVASTATIN CALCIUM 40 MG PO TABS Oral Take 1 tablet (40 mg total) by mouth at bedtime. 30 tablet 3  . CHLORTHALIDONE 25 MG PO TABS  TAKE 1/2 TABLET ONCE DAILY 30 tablet 6  . IBUPROFEN 800 MG PO TABS Oral Take 800 mg by mouth daily.     Marland Kitchen METOPROLOL TARTRATE 50 MG PO TABS  1/2 TABLET TWICE DAILY 30 tablet 11  . TIOTROPIUM BROMIDE MONOHYDRATE 18 MCG IN CAPS Inhalation Place 18 mcg into inhaler and inhale daily.        Physical Exam    BP 129/108  Pulse 81  Temp(Src) 97.9 F (36.6 C) (Oral)  Resp 18  SpO2 98%  Physical Exam  Constitutional: He is oriented to person, place, and time. He appears  well-developed and well-nourished.       Dry cough during exam  HENT:  Head: Normocephalic and atraumatic.  Eyes: Conjunctivae and EOM are normal. Pupils are equal, round, and reactive to light.  Neck: Trachea normal. Neck supple. No thyromegaly present.  Cardiovascular: Normal rate, regular rhythm, S1 normal, S2 normal and normal pulses.     No systolic murmur is present   No diastolic murmur is present  Pulses:      Radial pulses are 2+ on the right side, and 2+ on the left side.  Pulmonary/Chest: Effort normal and breath sounds normal. He has no wheezes. He has no rhonchi. He has no rales. He exhibits no tenderness.  Abdominal: Soft. Normal appearance and bowel sounds are normal. There is no tenderness. There is no CVA tenderness and negative Murphy's sign.  Musculoskeletal:       BLE:s Calves nontender, no cords or erythema, negative Homans sign  Neurological: He is alert and oriented to person, place, and time. He has normal strength. No cranial nerve deficit or sensory deficit. GCS eye subscore is 4. GCS verbal subscore is 5. GCS motor subscore is 6.  Skin: Skin is warm and dry. No rash noted. He is not diaphoretic.  Psychiatric: His speech is normal.       Cooperative and appropriate    ED Course  Procedures (including critical care time)  Labs Reviewed - No data to display  Dg Chest 2 View  03/20/2011  *RADIOLOGY REPORT*  Clinical Data: Cough.  CHEST - 2 VIEW  Comparison: Chest radiograph performed 02/28/2011  Findings: The lungs are well-aerated.  Very mild retrocardiac linear opacity is thought to reflect normal vasculature, without definite evidence of pneumonia.  There is no evidence of pleural effusion or pneumothorax.  The heart is normal in size; the mediastinal contour is within normal limits.  No acute osseous abnormalities are seen.  IMPRESSION: No definite evidence for pneumonia.  Original Report Authenticated By: Tonia Ghent, M.D.      MDM  Patient presenting  with cough and clinical presentation that suggests probable bronchitis. He was given a breathing treatment and on recheck did not seem to help his breathing. He was also given Tylenol with codeine elixir and this seemed to help his cough significantly. At 4:15 AM patient is feeling better and likely discharged home. Plan inhaler and Tylenol with codeine the patient states understanding recommendations to stop smoking.        Sunnie Nielsen, MD 03/20/11 [phone]

## 2011-03-29 ENCOUNTER — Telehealth: Payer: Self-pay | Admitting: Cardiology

## 2011-03-29 NOTE — Telephone Encounter (Signed)
Patient has questions regarding "labwork that he was to have done" / tg

## 2011-04-03 ENCOUNTER — Other Ambulatory Visit: Payer: Self-pay

## 2011-04-03 DIAGNOSIS — E785 Hyperlipidemia, unspecified: Secondary | ICD-10-CM

## 2011-04-07 ENCOUNTER — Telehealth: Payer: Self-pay | Admitting: *Deleted

## 2011-04-07 ENCOUNTER — Other Ambulatory Visit: Payer: Self-pay | Admitting: *Deleted

## 2011-04-07 DIAGNOSIS — I10 Essential (primary) hypertension: Secondary | ICD-10-CM

## 2011-04-07 NOTE — Telephone Encounter (Signed)
Upon review of patient's chart, required lab work had not been obtained, as requested at last visit.  Advised him to go to [facility] first thing Monday morning so that this information will be available when he is seen in office at 1pm 04/10/11.  Pt verbalized understanding.

## 2011-04-10 ENCOUNTER — Ambulatory Visit: Payer: Medicaid Other | Admitting: Cardiology

## 2011-04-11 ENCOUNTER — Encounter: Payer: Self-pay | Admitting: Cardiology

## 2011-04-14 NOTE — Progress Notes (Signed)
Lab orders given to pt. X 2./LV

## 2011-05-01 ENCOUNTER — Other Ambulatory Visit: Payer: Self-pay

## 2011-05-01 ENCOUNTER — Emergency Department (HOSPITAL_COMMUNITY): Payer: Medicaid Other

## 2011-05-01 ENCOUNTER — Emergency Department (HOSPITAL_COMMUNITY)
Admission: EM | Admit: 2011-05-01 | Discharge: 2011-05-01 | Disposition: A | Discharge status: Home / Self Care | Payer: Medicaid Other | Attending: Emergency Medicine | Admitting: Emergency Medicine

## 2011-05-01 ENCOUNTER — Encounter (HOSPITAL_COMMUNITY): Payer: Self-pay | Admitting: *Deleted

## 2011-05-01 DIAGNOSIS — Z720 Tobacco use: Secondary | ICD-10-CM

## 2011-05-01 DIAGNOSIS — R071 Chest pain on breathing: Secondary | ICD-10-CM | POA: Insufficient documentation

## 2011-05-01 DIAGNOSIS — I10 Essential (primary) hypertension: Secondary | ICD-10-CM | POA: Insufficient documentation

## 2011-05-01 DIAGNOSIS — E785 Hyperlipidemia, unspecified: Secondary | ICD-10-CM | POA: Insufficient documentation

## 2011-05-01 DIAGNOSIS — I2510 Atherosclerotic heart disease of native coronary artery without angina pectoris: Secondary | ICD-10-CM | POA: Insufficient documentation

## 2011-05-01 DIAGNOSIS — F1410 Cocaine abuse, uncomplicated: Secondary | ICD-10-CM | POA: Insufficient documentation

## 2011-05-01 DIAGNOSIS — F17200 Nicotine dependence, unspecified, uncomplicated: Secondary | ICD-10-CM | POA: Insufficient documentation

## 2011-05-01 DIAGNOSIS — K219 Gastro-esophageal reflux disease without esophagitis: Secondary | ICD-10-CM | POA: Insufficient documentation

## 2011-05-01 DIAGNOSIS — R0789 Other chest pain: Secondary | ICD-10-CM

## 2011-05-01 LAB — CBC
MCH: 31 pg (ref 26.0–34.0)
MCHC: 33.8 g/dL (ref 30.0–36.0)
MCV: 91.8 fL (ref 78.0–100.0)
Platelets: 186 10*3/uL (ref 150–400)
RDW: 13 % (ref 11.5–15.5)

## 2011-05-01 LAB — BASIC METABOLIC PANEL
Calcium: 9.6 mg/dL (ref 8.4–10.5)
GFR calc Af Amer: >90 mL/min (ref >90)
GFR calc non Af Amer: >90 mL/min (ref >90)
Glucose, Bld: 96 mg/dL (ref 70–99)
Potassium: 4 mEq/L (ref 3.5–5.1)
Sodium: 140 mEq/L (ref 135–145)

## 2011-05-01 LAB — DIFFERENTIAL
Basophils Absolute: 0 10*3/uL (ref 0.0–0.1)
Basophils Relative: 0 % (ref 0–1)
Eosinophils Absolute: 0 10*3/uL (ref 0.0–0.7)
Eosinophils Relative: 0 % (ref 0–5)
Lymphs Abs: 1.2 10*3/uL (ref 0.7–4.0)
Neutrophils Relative %: 77 % (ref 43–77)

## 2011-05-01 LAB — CARDIAC PANEL(CRET KIN+CKTOT+MB+TROPI): CK, MB: 2.6 ng/mL (ref 0.3–4.0)

## 2011-05-01 MED ORDER — HYDROCODONE-ACETAMINOPHEN 5-325 MG PO TABS: 2.0000 | ORAL_TABLET | ORAL | Status: AC | PRN

## 2011-05-01 MED ORDER — HYDROCODONE-ACETAMINOPHEN 5-325 MG PO TABS
2.0000 | ORAL_TABLET | Freq: Once | ORAL | Status: AC
  Administered 2011-05-01: 2 via ORAL
  Filled 2011-05-01: qty 2

## 2011-05-01 NOTE — ED Notes (Signed)
Pt reports pain to his left chest that began a few days ago.  Pt has hx of MI.  Pt reports some sob upon movement.  Pt denies any dizziness, n/v.
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
## 2011-05-01 NOTE — ED Notes (Signed)
Pt c/o chest pain x 3 days with worsening of sx; pt states he has had a MI in the past and had an artery stented but is unsure of which one; pt states the pain radiates around to his back

## 2011-05-01 NOTE — ED Notes (Signed)
Patient was told he could get dressed and wait on the nurse to discharge him.

## 2011-08-18 ENCOUNTER — Telehealth: Payer: Self-pay | Admitting: Cardiology

## 2011-08-18 NOTE — Telephone Encounter (Signed)
TAKING PT OFF NOS LIST THIS IS NOT A CORRECT PHONE NUMBER Bradley Hays

## 2011-08-21 ENCOUNTER — Emergency Department (HOSPITAL_COMMUNITY): Payer: Medicaid Other

## 2011-08-21 ENCOUNTER — Emergency Department (HOSPITAL_COMMUNITY)
Admission: EM | Admit: 2011-08-21 | Discharge: 2011-08-21 | Disposition: A | Discharge status: Home / Self Care | Payer: Medicaid Other | Attending: Emergency Medicine | Admitting: Emergency Medicine

## 2011-08-21 ENCOUNTER — Other Ambulatory Visit: Payer: Self-pay

## 2011-08-21 ENCOUNTER — Encounter (HOSPITAL_COMMUNITY): Payer: Self-pay | Admitting: *Deleted

## 2011-08-21 DIAGNOSIS — R209 Unspecified disturbances of skin sensation: Secondary | ICD-10-CM | POA: Insufficient documentation

## 2011-08-21 DIAGNOSIS — R05 Cough: Secondary | ICD-10-CM | POA: Insufficient documentation

## 2011-08-21 DIAGNOSIS — M542 Cervicalgia: Secondary | ICD-10-CM | POA: Insufficient documentation

## 2011-08-21 DIAGNOSIS — F17200 Nicotine dependence, unspecified, uncomplicated: Secondary | ICD-10-CM | POA: Insufficient documentation

## 2011-08-21 DIAGNOSIS — Z79899 Other long term (current) drug therapy: Secondary | ICD-10-CM | POA: Insufficient documentation

## 2011-08-21 DIAGNOSIS — R059 Cough, unspecified: Secondary | ICD-10-CM | POA: Insufficient documentation

## 2011-08-21 DIAGNOSIS — R5381 Other malaise: Secondary | ICD-10-CM | POA: Insufficient documentation

## 2011-08-21 DIAGNOSIS — I10 Essential (primary) hypertension: Secondary | ICD-10-CM | POA: Insufficient documentation

## 2011-08-21 DIAGNOSIS — E785 Hyperlipidemia, unspecified: Secondary | ICD-10-CM | POA: Insufficient documentation

## 2011-08-21 DIAGNOSIS — R079 Chest pain, unspecified: Secondary | ICD-10-CM | POA: Insufficient documentation

## 2011-08-21 DIAGNOSIS — K219 Gastro-esophageal reflux disease without esophagitis: Secondary | ICD-10-CM | POA: Insufficient documentation

## 2011-08-21 DIAGNOSIS — M79609 Pain in unspecified limb: Secondary | ICD-10-CM | POA: Insufficient documentation

## 2011-08-21 DIAGNOSIS — Z7982 Long term (current) use of aspirin: Secondary | ICD-10-CM | POA: Insufficient documentation

## 2011-08-21 DIAGNOSIS — R51 Headache: Secondary | ICD-10-CM | POA: Insufficient documentation

## 2011-08-21 DIAGNOSIS — R29898 Other symptoms and signs involving the musculoskeletal system: Secondary | ICD-10-CM | POA: Insufficient documentation

## 2011-08-21 DIAGNOSIS — I2510 Atherosclerotic heart disease of native coronary artery without angina pectoris: Secondary | ICD-10-CM | POA: Insufficient documentation

## 2011-08-21 DIAGNOSIS — I252 Old myocardial infarction: Secondary | ICD-10-CM | POA: Insufficient documentation

## 2011-08-21 HISTORY — DX: Old myocardial infarction: I25.2

## 2011-08-21 LAB — CBC
MCH: 31.9 pg (ref 26.0–34.0)
MCHC: 35.6 g/dL (ref 30.0–36.0)
MCV: 89.5 fL (ref 78.0–100.0)
Platelets: 207 10*3/uL (ref 150–400)
RBC: 4.77 MIL/uL (ref 4.22–5.81)

## 2011-08-21 LAB — BASIC METABOLIC PANEL
CO2: 26 mEq/L (ref 19–32)
Calcium: 9.9 mg/dL (ref 8.4–10.5)
Creatinine, Ser: 0.85 mg/dL (ref 0.50–1.35)
GFR calc non Af Amer: >90 mL/min (ref >90)
Glucose, Bld: 103 mg/dL — ABNORMAL HIGH (ref 70–99)

## 2011-08-21 LAB — PROTIME-INR
INR: 1.02 (ref 0.00–1.49)
Prothrombin Time: 13.6 seconds (ref 11.6–15.2)

## 2011-08-21 LAB — DIFFERENTIAL
Basophils Absolute: 0 10*3/uL (ref 0.0–0.1)
Basophils Relative: 0 % (ref 0–1)
Eosinophils Relative: 0 % (ref 0–5)
Lymphocytes Relative: 18 % (ref 12–46)
Monocytes Absolute: 1.2 10*3/uL — ABNORMAL HIGH (ref 0.1–1.0)
Monocytes Relative: 10 % (ref 3–12)

## 2011-08-21 MED ORDER — OXYCODONE-ACETAMINOPHEN 5-325 MG PO TABS: 2.0000 | ORAL_TABLET | ORAL | Status: DC | PRN

## 2011-08-21 MED ORDER — HYDROMORPHONE HCL PF 1 MG/ML IJ SOLN
1.0000 mg | Freq: Once | INTRAMUSCULAR | Status: AC
  Administered 2011-08-21: 1 mg via INTRAVENOUS
  Filled 2011-08-21: qty 1

## 2011-08-21 MED ORDER — FENTANYL CITRATE 0.05 MG/ML IJ SOLN
100.0000 ug | Freq: Once | INTRAMUSCULAR | Status: AC
  Administered 2011-08-21: 100 ug via INTRAVENOUS
  Filled 2011-08-21: qty 2

## 2011-08-21 NOTE — ED Notes (Signed)
Lab called critical ckmb of 10.4. Result read back. edp aware

## 2011-08-21 NOTE — ED Notes (Signed)
Pt back from CT

## 2011-08-21 NOTE — ED Notes (Signed)
Pt denies complaints at present time.  Awaiting discharge paperwork.

## 2011-08-21 NOTE — Discharge Instructions (Signed)
Your caregiver has diagnosed you as having chest pain that is not specific for one problem, but does not require admission.  You are at low risk for an acute heart condition or other serious illness. Chest pain comes from many different causes.  SEEK IMMEDIATE MEDICAL ATTENTION IF: You have severe chest pain, especially if the pain is crushing or pressure-like and spreads to the arms, back, neck, or jaw, or if you have sweating, nausea (feeling sick to your stomach), or shortness of breath. THIS IS AN EMERGENCY. Don't wait to see if the pain will go away. Get medical help at once. Call 911 or 0 (operator). DO NOT drive yourself to the hospital.  Your chest pain gets worse and does not go away with rest.  You have an attack of chest pain lasting longer than usual, despite rest and treatment with the medications your caregiver has prescribed.  You wake from sleep with chest pain or shortness of breath.  You feel dizzy or faint.  You have chest pain not typical of your usual pain for which you originally saw your caregiver.  RESOURCE GUIDE  Dental Problems  Patients with Medicaid: [facility] [id number]                                           [phone] [address] Phone:  [phone]                                                  Phone:  [phone]  If unable to pay or uninsured, contact:  [facility] or [facility]. to become qualified for the adult dental clinic.  Chronic Pain Problems Contact [facility]  [phone] Patients need to be referred by their primary care doctor.  Insufficient Money for Medicine Contact [employer]:  call "211" or [facility] [phone].  No Primary Care Doctor Call [facility]  [phone] Other agencies that provide inexpensive medical care    [facility]  [phone]    [facility]  [phone]    [facility]   [phone]    [facility]  [phone]    [facility]  [phone]    [facility]  [phone]  Psychological Services [facility]  [phone] [facility] Services  [phone] [facility]   [phone] (emergency services [phone])  Substance Abuse Resources [facility]  [phone] [facility] [phone] [place] [phone] Floydene Flock [phone] Residential & Outpatient Substance Abuse Program  [phone]  Abuse/Neglect [facility] Child Abuse Hotline [phone] [facility] Child Abuse Hotline [phone] (After Hours)  Emergency Shelter [facility] [phone]  Maternity Homes [facility] at the [place] [facility] [phone] [facility] [phone]  MRSA Hotline #:   9171128283    [facility]  [facility] Dept. [address]      [place] Phone:  [phone]                                   Phone:  [phone]                 Phone:  [place] Phone:  [place] [phone] (After Hours)
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
## 2011-08-21 NOTE — ED Notes (Signed)
Chest pain x 3 days with SOB and dizziness, hx stent to heart

## 2011-08-22 ENCOUNTER — Encounter: Payer: Self-pay | Admitting: Cardiology

## 2011-08-22 ENCOUNTER — Ambulatory Visit (INDEPENDENT_AMBULATORY_CARE_PROVIDER_SITE_OTHER): Payer: Medicaid Other | Admitting: Cardiology

## 2011-08-22 DIAGNOSIS — E785 Hyperlipidemia, unspecified: Secondary | ICD-10-CM

## 2011-08-22 DIAGNOSIS — I2510 Atherosclerotic heart disease of native coronary artery without angina pectoris: Secondary | ICD-10-CM

## 2011-08-22 DIAGNOSIS — I428 Other cardiomyopathies: Secondary | ICD-10-CM

## 2011-08-22 DIAGNOSIS — Z95810 Presence of automatic (implantable) cardiac defibrillator: Secondary | ICD-10-CM

## 2011-08-22 DIAGNOSIS — I10 Essential (primary) hypertension: Secondary | ICD-10-CM

## 2011-08-22 MED ORDER — ATORVASTATIN CALCIUM 40 MG PO TABS: 40.0000 mg | ORAL_TABLET | Freq: Every day | ORAL | Status: DC

## 2011-08-22 MED ORDER — LISINOPRIL-HYDROCHLOROTHIAZIDE 10-12.5 MG PO TABS: 1.0000 | ORAL_TABLET | Freq: Every day | ORAL | Status: DC

## 2011-08-22 MED ORDER — HYDROCODONE-ACETAMINOPHEN 5-500 MG PO TABS: 1.0000 | ORAL_TABLET | Freq: Three times a day (TID) | ORAL | Status: AC | PRN

## 2011-08-22 NOTE — Assessment & Plan Note (Signed)
Blood pressure is not that bad considering patient is off all medication.  Lisinopril/HCT 20/12.5 mg will be added to his current medication with home blood pressure checks, a return office visit to the cardiology nurses in one month and a repeat chemistry profile in one month.

## 2011-08-22 NOTE — Progress Notes (Signed)
Patient ID: Bradley Hays, male   DOB: 1970/12/29, 41 y.o.   MRN: [id number] HPI: Patient lost to follow-up but now returns after a recent evaluation in the emergency department for chest discomfort.  Hospital records were obtained and reviewed and EKG and chest x-ray personally reviewed.  He reports moderately severe left parasternal aching and pressure that has been present for weeks if not months.  He also has exertional dyspnea, and symptoms are exacerbated by activity.  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  He was treated with fentanyl and Dilaudid in the emergency department with improvement.  Cardiac markers and EKG were negative.  He was provided with a few tablets of oxycodone, which has continued to relieve his chest discomfort.  He has significant chronic anxiety but denies depression.  He has long-standing sleep disturbance that is currently treating with tramadol, which allows him to get a few hours of rest, but after middle of the night awakening, he sleeps poorly.  Prior to Admission medications   Medication Sig Start Date End Date Taking? Authorizing Provider  aspirin 325 MG EC tablet Take 325 mg by mouth daily.     Yes Historical Provider, MD  Aspirin-Acetaminophen-Caffeine (GOODY HEADACHE PO) Take 1 packet by mouth daily as needed. Pain    Yes Historical Provider, MD  atorvastatin (LIPITOR) 40 MG tablet Take 1 tablet (40 mg total) by mouth daily. 08/22/11 08/21/12  Gerrit Friends. Charlea Nardo, MD  HYDROcodone-acetaminophen (VICODIN) 5-500 MG per tablet Take 1 tablet by mouth 3 (three) times daily as needed for pain. 08/22/11 09/01/11  Gerrit Friends. Vickey Boak, MD  lisinopril-hydrochlorothiazide (PRINZIDE,ZESTORETIC) 10-12.5 MG per tablet Take 1 tablet by mouth daily. 08/22/11 08/21/12  Gerrit Friends. Dietrich Pates, MD  No Known Allergies    Past medical history, social history, and  family history reviewed and updated.  ROS: Denies orthopnea, PND, palpitations, lightheadedness, syncope or pedal edema.  He has not taken any of his medications in recent months except aspirin due to losing Medicaid coverage.  PHYSICAL EXAM: BP 151/95  Pulse 95  Ht 5\' 11"  (1.803 m)  Wt 103.42 kg (228 lb)  BMI 31.80 kg/m2  General-Well developed; no acute distress Body habitus-proportionate weight and height Neck-No JVD; no carotid bruits Lungs-clear lung fields; resonant to percussion Cardiovascular-normal PMI; normal S1 and S2 Abdomen-normal bowel sounds; soft and non-tender without masses or organomegaly Musculoskeletal-No deformities, no cyanosis or clubbing Neurologic-Normal cranial nerves; symmetric strength and tone Skin-Warm, no significant lesions Extremities-Decreased distal pulses; no edema  EKG:  Tracing performed in the emergency department on 08/21/11 obtained and reviewed.  Normal sinus rhythm, incomplete right bundle-branch block, Counterclockwise rotation in the frontal plane, otherwise normal.  When compared to a previous tracing performed 05/01/11, there has been no significant interval change.  ASSESSMENT AND PLAN:  [place] Bing, MD 08/22/2011 3:53 PM

## 2011-08-22 NOTE — Assessment & Plan Note (Addendum)
Most recent lipid values were markedly abnormal and likely obtained when patient was not taking his prescribed medication.  He will resume atorvastatin 40 mg per day, but he probably would benefit from an even higher dose.  A lipid profile will be checked in one month.

## 2011-08-22 NOTE — Patient Instructions (Addendum)
Your physician recommends that you schedule a follow-up appointment in: 5 weeks  Your physician recommends that you return for lab work in: 1 month (you will receive a letter)  Your physician has requested that you have an echocardiogram. Echocardiography is a painless test that uses sound waves to create images of your heart. It provides your doctor with information about the size and shape of your heart and how well your heart's chambers and valves are working. This procedure takes approximately one hour. There are no restrictions for this procedure.  In office Stress Test  Your physician has recommended you make the following change in your medication:  1 - Lisinopril/HCT 20/12.5 mg daily 2 - Atorvastatin 40 mg daily 3 - DECREASE ASA TO 81 MG

## 2011-08-22 NOTE — Assessment & Plan Note (Addendum)
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  It is not entirely clear that he has ASCVD, but that will be our working hypothesis.  Although his chronic chest discomfort is clearly not of ischemic origin, we will proceed with a stress test to exclude recurrent or new obstructive disease.  This will also allow [place] to evaluate exercise capacity and to exclude exercise-induced hypoxemia.  Hydrocodone/acetaminophen 5/500 mg, #40 1-2 tablets up to 3 times a day as needed.  Patient refused Ultracet indicating that he had previously developed severe nausea with that medication.  He is referred to a primary care physician where further evaluation and treatment of chronic pain will be undertaken.  He may require referral to a pain clinic.

## 2011-08-23 ENCOUNTER — Encounter: Payer: Self-pay | Admitting: *Deleted

## 2011-08-24 ENCOUNTER — Ambulatory Visit (HOSPITAL_COMMUNITY): Payer: Medicaid Other | Attending: Cardiology

## 2011-08-24 ENCOUNTER — Telehealth: Payer: Self-pay | Admitting: *Deleted

## 2011-08-24 ENCOUNTER — Ambulatory Visit (INDEPENDENT_AMBULATORY_CARE_PROVIDER_SITE_OTHER): Payer: Medicaid Other

## 2011-08-24 DIAGNOSIS — R079 Chest pain, unspecified: Secondary | ICD-10-CM

## 2011-08-24 DIAGNOSIS — Z95810 Presence of automatic (implantable) cardiac defibrillator: Secondary | ICD-10-CM

## 2011-08-24 DIAGNOSIS — I428 Other cardiomyopathies: Secondary | ICD-10-CM

## 2011-08-24 MED ORDER — ALPRAZOLAM ER 0.5 MG PO TB24: 0.5000 mg | ORAL_TABLET | Freq: Two times a day (BID) | ORAL | Status: DC | PRN

## 2011-08-24 MED ORDER — ALPRAZOLAM 0.5 MG PO TABS: 0.5000 mg | ORAL_TABLET | Freq: Two times a day (BID) | ORAL | Status: AC | PRN

## 2011-08-24 NOTE — Progress Notes (Signed)
Stress Lab Nurses Notes - Bradley Hays 08/24/2011  Reason for doing test: CAD and Chest Pain Type of test: Regular GTX Nurse performing test: Parke Poisson, RN Nuclear Medicine Tech: Not Applicable Echo Tech: Not Applicable MD performing test: R. Rothbart Family MD: NPCP Test explained and consent signed: yes IV started: No IV started Symptoms: Pain in Left Leg & Fatigue Treatment/Intervention: None Reason test stopped: fatigue After recovery IV was: None Patient to return to Nuc. Med at :NA Patient discharged: Home Patient's Condition upon discharge was: stable Comments: During test peak BP 202/82 & HR 142.  Recovery BP 178/82 & HR 92.  Symptoms of pain in left leg relieved some. Erskine Speed T

## 2011-08-24 NOTE — Progress Notes (Signed)
Per Dr Dietrich Pates, will order bilateral arterial dopplers with ABI and xanax 0.5 mg bid prn #30 with no refills.

## 2011-08-24 NOTE — Telephone Encounter (Signed)
Message copied by Gaynelle Adu on Thu Aug 24, 2011 10:28 AM ------      Message from: [place], [place] A      Created: Wed Aug 23, 2011  9:36 AM      Regarding: FW: Treadmill and Echo                   ----- Message -----         From: Dyane Dustman         Sent: 08/22/2011   4:05 PM           To: Cathren Harsh, RN      Subject: Treadmill and Echo                                       To be done 08/24/11 @ [facility]            Thanks,      Aurther Loft

## 2011-08-27 NOTE — Progress Notes (Signed)
Patient ID: Bradley Hays, male   DOB: Dec 07, 1970, 41 y.o.   MRN: [id number]  Graded Exercise Test-Interpretation      Graded exercise performed to a work load of 7.3 METs and a heart rate of 144, 80% of age-predicted maximum.  Exercise discontinued due to dyspnea and fatigue; no chest discomfort reported.  Blood pressure increased from a resting value of 160/90 to 200/80 at peak exercise.  No arrhythmias noted.  Oxygen saturation was 94% at rest with no decrease noted during exercise.  EKG: normal sinus rhythm; right ventricular conduction delay; prominent QRS voltage; otherwise normal Stress EKG:  normal Impression:  Negative graded exercise test demonstrating impaired exercise capacity but no exercise-induced chest discomfort and no echocardiographic evidence for ischemia or infarction.  Other findings as noted.  [name], M.D.

## 2011-08-29 ENCOUNTER — Telehealth: Payer: Self-pay | Admitting: *Deleted

## 2011-08-29 ENCOUNTER — Other Ambulatory Visit: Payer: Self-pay | Admitting: Cardiology

## 2011-08-29 DIAGNOSIS — I739 Peripheral vascular disease, unspecified: Secondary | ICD-10-CM

## 2011-08-31 ENCOUNTER — Ambulatory Visit (HOSPITAL_COMMUNITY)
Admission: RE | Admit: 2011-08-31 | Discharge: 2011-08-31 | Disposition: A | Discharge status: Home / Self Care | Payer: Medicaid Other | Source: Ambulatory Visit | Attending: Cardiology | Admitting: Cardiology

## 2011-08-31 DIAGNOSIS — E785 Hyperlipidemia, unspecified: Secondary | ICD-10-CM | POA: Insufficient documentation

## 2011-08-31 DIAGNOSIS — I517 Cardiomegaly: Secondary | ICD-10-CM

## 2011-08-31 DIAGNOSIS — I10 Essential (primary) hypertension: Secondary | ICD-10-CM | POA: Insufficient documentation

## 2011-08-31 DIAGNOSIS — I739 Peripheral vascular disease, unspecified: Secondary | ICD-10-CM

## 2011-08-31 DIAGNOSIS — I2589 Other forms of chronic ischemic heart disease: Secondary | ICD-10-CM | POA: Insufficient documentation

## 2011-08-31 DIAGNOSIS — F17200 Nicotine dependence, unspecified, uncomplicated: Secondary | ICD-10-CM | POA: Insufficient documentation

## 2011-08-31 NOTE — Progress Notes (Signed)
*  PRELIMINARY RESULTS* Echocardiogram 2D Echocardiogram has been performed.  Bradley Hays 08/31/2011, 10:58 AM

## 2011-08-31 NOTE — Progress Notes (Signed)
*  PRELIMINARY RESULTS* Echocardiogram 2D Echocardiogram has been performed.  Bradley Hays 08/31/2011, 11:15 AM

## 2011-09-15 ENCOUNTER — Other Ambulatory Visit: Payer: Self-pay | Admitting: *Deleted

## 2011-09-15 DIAGNOSIS — Z95810 Presence of automatic (implantable) cardiac defibrillator: Secondary | ICD-10-CM

## 2011-09-15 DIAGNOSIS — E785 Hyperlipidemia, unspecified: Secondary | ICD-10-CM

## 2011-09-15 DIAGNOSIS — I2510 Atherosclerotic heart disease of native coronary artery without angina pectoris: Secondary | ICD-10-CM

## 2011-09-15 DIAGNOSIS — I10 Essential (primary) hypertension: Secondary | ICD-10-CM

## 2011-09-15 DIAGNOSIS — I428 Other cardiomyopathies: Secondary | ICD-10-CM

## 2011-09-19 ENCOUNTER — Other Ambulatory Visit: Payer: Self-pay | Admitting: Cardiology

## 2011-09-19 LAB — COMPREHENSIVE METABOLIC PANEL
AST: 22 U/L (ref 0–37)
Albumin: 4.4 g/dL (ref 3.5–5.2)
BUN: 14 mg/dL (ref 6–23)
Calcium: 9.7 mg/dL (ref 8.4–10.5)
Chloride: 100 mEq/L (ref 96–112)
Creat: 0.95 mg/dL (ref 0.50–1.35)
Glucose, Bld: 98 mg/dL (ref 70–99)

## 2011-09-19 LAB — CBC
HCT: 47.4 % (ref 39.0–52.0)
Hemoglobin: 16.1 g/dL (ref 13.0–17.0)
MCV: 92 fL (ref 78.0–100.0)
RDW: 13 % (ref 11.5–15.5)
WBC: 10.8 10*3/uL — ABNORMAL HIGH (ref 4.0–10.5)

## 2011-09-20 LAB — LIPID PANEL
Cholesterol: 195 mg/dL (ref 0–200)
HDL: 40 mg/dL (ref >39)
LDL Cholesterol: 94 mg/dL (ref 0–99)
Triglycerides: 307 mg/dL — ABNORMAL HIGH (ref <150)

## 2011-09-20 LAB — TSH: TSH: 1.296 u[IU]/mL (ref 0.350–4.500)

## 2011-09-21 ENCOUNTER — Encounter: Payer: Self-pay | Admitting: *Deleted

## 2011-09-21 ENCOUNTER — Other Ambulatory Visit: Payer: Self-pay | Admitting: *Deleted

## 2011-09-21 MED ORDER — ATORVASTATIN CALCIUM 80 MG PO TABS: 80.0000 mg | ORAL_TABLET | Freq: Every day | ORAL | Status: DC

## 2011-09-25 ENCOUNTER — Telehealth: Payer: Self-pay | Admitting: Cardiology

## 2011-09-25 NOTE — Telephone Encounter (Signed)
Please advise as to wether xanax may be refilled by our office, as patient does not have a PCP

## 2011-09-25 NOTE — Telephone Encounter (Signed)
PT NEEDS NEW RX CALLED IN FOR LISINOPRIL IT HAS BEEN CHANGED TO 80MG  AND NEEDS A REFILL ON XANAX.

## 2011-09-26 ENCOUNTER — Ambulatory Visit: Payer: Medicaid Other | Admitting: Cardiology

## 2011-09-26 ENCOUNTER — Telehealth: Payer: Self-pay | Admitting: Cardiology

## 2011-09-26 NOTE — Telephone Encounter (Signed)
I I requested that he establish with a primary care physician at his most recent office visit.  According to the note, he was provided with Vicodin at that visit, not benzodiazepines.  I do not see him with adequate frequency to manage pain medication and anti-anxiety medication and thus will not be able to provide him with additional prescriptions for these agents.

## 2011-09-26 NOTE — Telephone Encounter (Signed)
PT CALLING TO ASK AGAIN FOR [place] TO GIVE HIM RX REFILLS FOR XANAX, I HAVE TOLD THAT DR ROTHBART WILL NOT BE ABLE TO REFILL AT THIS TIME, BECAUSE HE WAS TOLD TO GET IN WITH A PCP. PT STATES THAT HE IS UNABLE TO FIND A PCP THAT WILL TAKE MEDICAD.

## 2011-09-26 NOTE — Telephone Encounter (Signed)
Attempted to contact patient.  Message left for a return call. 

## 2011-09-26 NOTE — Telephone Encounter (Signed)
Advised patient of recommendations by Dr Dietrich Pates.  Lipitor, not Lisinopril is 80 mg and has been sent to [facility]

## 2011-10-09 ENCOUNTER — Ambulatory Visit (INDEPENDENT_AMBULATORY_CARE_PROVIDER_SITE_OTHER): Payer: Medicaid Other | Admitting: Cardiology

## 2011-10-09 ENCOUNTER — Encounter: Payer: Self-pay | Admitting: Cardiology

## 2011-10-09 VITALS — BP 144/92 | HR 96 | Resp 16 | Ht 71.0 in | Wt 222.0 lb

## 2011-10-09 DIAGNOSIS — I10 Essential (primary) hypertension: Secondary | ICD-10-CM

## 2011-10-09 DIAGNOSIS — Z720 Tobacco use: Secondary | ICD-10-CM

## 2011-10-09 DIAGNOSIS — E785 Hyperlipidemia, unspecified: Secondary | ICD-10-CM

## 2011-10-09 DIAGNOSIS — I2510 Atherosclerotic heart disease of native coronary artery without angina pectoris: Secondary | ICD-10-CM

## 2011-10-09 DIAGNOSIS — F17200 Nicotine dependence, unspecified, uncomplicated: Secondary | ICD-10-CM

## 2011-10-09 MED ORDER — ALPRAZOLAM 0.5 MG PO TABS: ORAL_TABLET | ORAL | Status: DC

## 2011-10-09 MED ORDER — LISINOPRIL-HYDROCHLOROTHIAZIDE 20-25 MG PO TABS: 1.0000 | ORAL_TABLET | Freq: Every day | ORAL | Status: AC

## 2011-10-09 NOTE — Assessment & Plan Note (Signed)
Patient is strongly encouraged to discontinue cigarette smoking, but does not appear to be mentally prepared to do so yet.  60 0.5 mg tablets of alprazolam prescribed.  Patient has requested an initial visit to establish with the [facility] medical group.

## 2011-10-09 NOTE — Assessment & Plan Note (Signed)
Dramatic improvement and hyperlipidemia with moderate dose atorvastatin.  Dosage will be further increased to 80 mg per day with a repeat lipid profile in one month.

## 2011-10-09 NOTE — Assessment & Plan Note (Signed)
Although symptoms are impressive, I doubt that they reflect myocardial ischemia.  Trial of sublingual nitroglycerin recommended for discomfort it is not associated with anger or other strong emotions.

## 2011-10-09 NOTE — Progress Notes (Signed)
Patient ID: Bradley Hays, male   DOB: 1970/11/18, 41 y.o.   MRN: [id number]  HPI: Scheduled return visit for this young gentleman with coronary artery disease.  Since I last saw him, he has done fairly well.  He continues to experience chest discomfort with anxiety that is unrelieved with nitroglycerin.  He also develops pain at rest and with exertion that he describes as similar to a muscle cramp.  He has exercise intolerance with exertional dyspnea.  Nitroglycerin results in a substantial headache, which discourages him from using it.  A family member is a Engineer, civil (consulting), who provides him with what sounds like good advice.  Prior to Admission medications   Medication Sig Start Date End Date Taking? Authorizing Provider  aspirin 81 MG tablet Take 81 mg by mouth daily.   Yes Historical Provider, MD  Aspirin-Acetaminophen-Caffeine (GOODY HEADACHE PO) Take 1 packet by mouth daily as needed. Pain    Yes Historical Provider, MD  atorvastatin (LIPITOR) 80 MG tablet Take 1 tablet (80 mg total) by mouth daily. 09/21/11 09/20/12 Yes Kathlen Brunswick, MD  ALPRAZolam Prudy Feeler) 0.5 MG tablet Take 1 tablet twice a day as needed 10/09/11   Kathlen Brunswick, MD  lisinopril-hydrochlorothiazide (PRINZIDE,ZESTORETIC) 20-25 MG per tablet Take 1 tablet by mouth daily. 10/09/11 10/08/12  Kathlen Brunswick, MD  No Known Allergies    Past medical history, social history, and family history reviewed and updated.  ROS: Denies orthopnea, PND, pedal edema.  He has had a chronic cough and continues to smoke cigarettes.  He reports substantial anxiety that is greatly helped by Xanax, his supply of which she is exhausted.  PHYSICAL EXAM: BP 144/92  Pulse 96  Resp 16  Ht 5\' 11"  (1.803 m)  Wt 100.699 kg (222 lb)  BMI 30.96 kg/m2  General-Well developed; no acute distress Body habitus-Overweight Neck-No JVD; no carotid bruits Lungs-clear lung fields; resonant to percussion Cardiovascular-normal PMI; normal S1 and S2; S4  present Abdomen-normal bowel sounds; soft and non-tender without masses or organomegaly Musculoskeletal-No deformities, no cyanosis or clubbing Neurologic-Normal cranial nerves; symmetric strength and tone Skin-Warm, no significant lesions Extremities-distal pulses intact; Trace edema  ASSESSMENT AND PLAN:  Snyder Bing, MD 10/09/2011 4:22 PM

## 2011-10-09 NOTE — Assessment & Plan Note (Signed)
Blood pressure control is modestly suboptimal.  His dose of lisinopril/hydrochlorothiazide will be doubled with continuing monitoring of home blood pressures.

## 2011-10-09 NOTE — Patient Instructions (Signed)
Your physician recommends that you schedule a follow-up appointment in: 6 month follow up  Your physician has requested that you regularly monitor and record your blood pressure readings at home. Please use the same machine at the same time of day to check your readings and record them to bring to your follow-up visit.  CALL IF 1 IN 4 BLOOD PRESSURES ARE > 140/90  Your physician has recommended you make the following change in your medication:  1 - START XANAX 0.5 MG TWICE A DAY AS NEEDED 2 - INCREASE LISINOPRIL HCT TO 20/25 MG DAILY

## 2011-10-09 NOTE — Progress Notes (Signed)
Name: Bradley Hays    DOB: Jul 12, 1970  Age: 41 y.o.  MR#: [id number]       PCP:  No primary provider on file.      Insurance: @PAYORNAME @   CC:    Chief Complaint  Patient presents with  . Appointment    no complaints -meds/list    VS BP 144/92  Pulse 96  Resp 16  Ht 5\' 11"  (1.803 m)  Wt 222 lb (100.699 kg)  BMI 30.96 kg/m2  Weights Current Weight  10/09/11 222 lb (100.699 kg)  08/22/11 228 lb (103.42 kg)  08/21/11 225 lb (102.059 kg)    Blood Pressure  BP Readings from Last 3 Encounters:  10/09/11 144/92  08/22/11 151/95  08/21/11 135/86     Admit date:  (Not on file) Last encounter with RMR:  09/26/2011   Allergy No Known Allergies  Current Outpatient Prescriptions  Medication Sig Dispense Refill  . aspirin 81 MG tablet Take 81 mg by mouth daily.      . Aspirin-Acetaminophen-Caffeine (GOODY HEADACHE PO) Take 1 packet by mouth daily as needed. Pain       . atorvastatin (LIPITOR) 80 MG tablet Take 1 tablet (80 mg total) by mouth daily.  30 tablet  12  . lisinopril-hydrochlorothiazide (PRINZIDE,ZESTORETIC) 10-12.5 MG per tablet Take 1 tablet by mouth daily.  30 tablet  12    Discontinued Meds:   There are no discontinued medications.  Patient Active Problem List  Diagnoses  . Arteriosclerotic cardiovascular disease (ASCVD)  . Hypertension  . Hyperlipidemia  . Tobacco abuse  . Gastroesophageal reflux disease  . Cocaine abuse    LABS Orders Only on 09/19/2011  Component Date Value  . Cholesterol 09/19/2011 195   . Triglycerides 09/19/2011 307*  . HDL 09/19/2011 40   . Total CHOL/HDL Ratio 09/19/2011 4.9   . VLDL 09/19/2011 61*  . LDL Cholesterol 09/19/2011 94   Orders Only on 09/15/2011  Component Date Value  . WBC 09/19/2011 10.8*  . RBC 09/19/2011 5.15   . Hemoglobin 09/19/2011 16.1   . HCT 09/19/2011 47.4   . MCV 09/19/2011 92.0   . [facility] 09/19/2011 31.3   . MCHC 09/19/2011 34.0   . RDW 09/19/2011 13.0   . Platelets 09/19/2011 217   .  Sodium 09/19/2011 138   . Potassium 09/19/2011 4.2   . Chloride 09/19/2011 100   . CO2 09/19/2011 27   . Glucose, Bld 09/19/2011 98   . BUN 09/19/2011 14   . Creat 09/19/2011 0.95   . Total Bilirubin 09/19/2011 0.4   . Alkaline Phosphatase 09/19/2011 51   . AST 09/19/2011 22   . ALT 09/19/2011 46   . Total Protein 09/19/2011 6.6   . Albumin 09/19/2011 4.4   . Calcium 09/19/2011 9.7   . TSH 09/19/2011 1.296   Admission on 08/21/2011, Discharged on 08/21/2011  Component Date Value  . WBC 08/21/2011 13.2*  . RBC 08/21/2011 4.77   . Hemoglobin 08/21/2011 15.2   . HCT 08/21/2011 42.7   . MCV 08/21/2011 89.5   . [facility] 08/21/2011 31.9   . MCHC 08/21/2011 35.6   . RDW 08/21/2011 13.0   . Platelets 08/21/2011 207   . Sodium 08/21/2011 139   . Potassium 08/21/2011 3.5   . Chloride 08/21/2011 103   . CO2 08/21/2011 26   . Glucose, Bld 08/21/2011 103*  . BUN 08/21/2011 20   . Creatinine, Ser 08/21/2011 0.85   . Calcium 08/21/2011 9.9   .  GFR calc non Af Amer 08/21/2011 >90   . GFR calc Af Amer 08/21/2011 >90   . Troponin I 08/21/2011 <0.30   . Prothrombin Time 08/21/2011 13.6   . INR 08/21/2011 1.02   . aPTT 08/21/2011 28   . Neutrophils Relative 08/21/2011 72   . Neutro Abs 08/21/2011 9.2*  . Lymphocytes Relative 08/21/2011 18   . Lymphs Abs 08/21/2011 2.3   . Monocytes Relative 08/21/2011 10   . Monocytes Absolute 08/21/2011 1.2*  . Eosinophils Relative 08/21/2011 0   . Eosinophils Absolute 08/21/2011 0.1   . Basophils Relative 08/21/2011 0   . Basophils Absolute 08/21/2011 0.0   . Total CK 08/21/2011 323*  . CK, MB 08/21/2011 10.4*  . Relative Index 08/21/2011 3.2*     Results for this Opt Visit:     Results for orders placed in visit on 09/19/11  LIPID PANEL      Component Value Range   Cholesterol 195  0 - 200 (mg/dL)   Triglycerides 604 (*) <150 (mg/dL)   HDL 40  >54 (mg/dL)   Total CHOL/HDL Ratio 4.9     VLDL 61 (*) 0 - 40 (mg/dL)   LDL Cholesterol 94  0 - 99  (mg/dL)    EKG Orders placed in visit on 08/24/11  . 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  It is not entirely clear that he has ASCVD, but that will be our working hypothesis.  Although his chronic chest discomfort is clearly not of ischemic origin, we will proceed with a stress test to exclude recurrent or new obstructive disease.  This will also allow [place] to evaluate exercise capacity and to exclude exercise-induced hypoxemia.  Hydrocodone/acetaminophen 5/500 mg, #40 1-2 tablets up to 3 times a day as needed.  Patient refused Ultracet indicating that he had previously developed severe nausea with that medication.  He is referred to a primary care physician where further evaluation and treatment of chronic pain will be undertaken.  He may require referral to a pain clinic.    Hypertension   Last Assessment & Plan Note   08/22/2011 Office Visit Signed 08/22/2011  4:37 PM by Kathlen Brunswick, MD    Blood pressure is not that bad considering patient is off all medication.  Lisinopril/HCT 20/12.5 mg will be added to his current medication with home blood pressure checks, a return office visit to the cardiology nurses in one month and a repeat chemistry profile in one month.    Hyperlipidemia   Last Assessment & Plan Note   08/22/2011 Office Visit Addendum 08/27/2011  8:41 AM by Kathlen Brunswick, MD    Most recent lipid values were markedly abnormal and likely obtained when patient was not taking his prescribed medication.  He will resume atorvastatin 40 mg per day, but he probably would benefit from an even higher dose.  A lipid profile will be checked in one month.       Other   Tobacco abuse   Last Assessment & Plan Note   01/20/2011 Office Visit Addendum 01/22/2011 11:42 AM by Kathlen Brunswick, MD  Patient reports substantial anxiety and major life stress.  He does not believe that he can discontinue cigarette smoking with this level of anxiety.  He has difficulty falling asleep and middle of the night awakening as well.  Ativan will be utilized to treat anxiety and insomnia, which may permit an attempt quit tobacco use in the near future.  Return visits will be scheduled at short intervals until patient is stable and treatment is optimal.    Gastroesophageal reflux disease   Cocaine abuse   Last Assessment & Plan Note   03/06/2011 Office Visit Signed 03/06/2011  7:54 PM by Kathlen Brunswick, MD    Patient denies any recent use of illicit substances.        Imaging: No results found.   FRS Calculation: Score not calculated. Missing: Total Cholesterol

## 2011-10-19 ENCOUNTER — Other Ambulatory Visit: Payer: Self-pay | Admitting: *Deleted

## 2011-10-19 DIAGNOSIS — I10 Essential (primary) hypertension: Secondary | ICD-10-CM

## 2011-10-26 ENCOUNTER — Emergency Department (HOSPITAL_COMMUNITY): Payer: Medicaid Other

## 2011-10-26 ENCOUNTER — Emergency Department (HOSPITAL_COMMUNITY)
Admission: EM | Admit: 2011-10-26 | Discharge: 2011-10-26 | Disposition: A | Discharge status: Home / Self Care | Payer: Medicaid Other | Attending: Emergency Medicine | Admitting: Emergency Medicine

## 2011-10-26 ENCOUNTER — Encounter (HOSPITAL_COMMUNITY): Payer: Self-pay | Admitting: Emergency Medicine

## 2011-10-26 DIAGNOSIS — R109 Unspecified abdominal pain: Secondary | ICD-10-CM | POA: Insufficient documentation

## 2011-10-26 DIAGNOSIS — K760 Fatty (change of) liver, not elsewhere classified: Secondary | ICD-10-CM

## 2011-10-26 DIAGNOSIS — I10 Essential (primary) hypertension: Secondary | ICD-10-CM | POA: Insufficient documentation

## 2011-10-26 DIAGNOSIS — I252 Old myocardial infarction: Secondary | ICD-10-CM | POA: Insufficient documentation

## 2011-10-26 DIAGNOSIS — E785 Hyperlipidemia, unspecified: Secondary | ICD-10-CM | POA: Insufficient documentation

## 2011-10-26 DIAGNOSIS — K7689 Other specified diseases of liver: Secondary | ICD-10-CM | POA: Insufficient documentation

## 2011-10-26 DIAGNOSIS — F17200 Nicotine dependence, unspecified, uncomplicated: Secondary | ICD-10-CM | POA: Insufficient documentation

## 2011-10-26 DIAGNOSIS — R079 Chest pain, unspecified: Secondary | ICD-10-CM | POA: Insufficient documentation

## 2011-10-26 LAB — COMPREHENSIVE METABOLIC PANEL
Alkaline Phosphatase: 53 U/L (ref 39–117)
BUN: 15 mg/dL (ref 6–23)
CO2: 24 mEq/L (ref 19–32)
Calcium: 9.3 mg/dL (ref 8.4–10.5)
GFR calc Af Amer: >90 mL/min (ref >90)
GFR calc non Af Amer: 86 mL/min — ABNORMAL LOW (ref >90)
Glucose, Bld: 92 mg/dL (ref 70–99)
Total Protein: 7 g/dL (ref 6.0–8.3)

## 2011-10-26 LAB — CBC
HCT: 42.9 % (ref 39.0–52.0)
Hemoglobin: 14.9 g/dL (ref 13.0–17.0)
MCH: 31.7 pg (ref 26.0–34.0)
MCV: 91.3 fL (ref 78.0–100.0)
RBC: 4.7 MIL/uL (ref 4.22–5.81)

## 2011-10-26 LAB — DIFFERENTIAL
Eosinophils Absolute: 0.1 10*3/uL (ref 0.0–0.7)
Eosinophils Relative: 1 % (ref 0–5)
Lymphocytes Relative: 20 % (ref 12–46)
Lymphs Abs: 1.8 10*3/uL (ref 0.7–4.0)
Monocytes Relative: 13 % — ABNORMAL HIGH (ref 3–12)

## 2011-10-26 LAB — LIPASE, BLOOD: Lipase: 27 U/L (ref 11–59)

## 2011-10-26 MED ORDER — MORPHINE SULFATE 4 MG/ML IJ SOLN
4.0000 mg | Freq: Once | INTRAMUSCULAR | Status: AC
  Administered 2011-10-26: 4 mg via INTRAVENOUS
  Filled 2011-10-26: qty 1

## 2011-10-26 MED ORDER — OXYCODONE-ACETAMINOPHEN 5-325 MG PO TABS: 1.0000 | ORAL_TABLET | ORAL | Status: DC | PRN

## 2011-10-26 MED ORDER — HYDROMORPHONE HCL PF 1 MG/ML IJ SOLN
1.0000 mg | Freq: Once | INTRAMUSCULAR | Status: AC
  Administered 2011-10-26: 1 mg via INTRAVENOUS

## 2011-10-26 MED ORDER — HYDROMORPHONE HCL PF 1 MG/ML IJ SOLN
INTRAMUSCULAR | Status: AC
  Filled 2011-10-26: qty 1

## 2011-10-26 MED ORDER — ONDANSETRON HCL 4 MG/2ML IJ SOLN
4.0000 mg | Freq: Once | INTRAMUSCULAR | Status: AC
  Administered 2011-10-26: 4 mg via INTRAVENOUS
  Filled 2011-10-26: qty 2

## 2011-10-26 NOTE — ED Notes (Signed)
Pt c/o right rib cage pain x 1 month.

## 2011-10-26 NOTE — Discharge Instructions (Signed)
Abdominal Pain Abdominal pain can be caused by many things. Your caregiver decides the seriousness of your pain by an examination and possibly blood tests and X-rays. Many cases can be observed and treated at home. Most abdominal pain is not caused by a disease and will probably improve without treatment. However, in many cases, more time must pass before a clear cause of the pain can be found. Before that point, it may not be known if you need more testing, or if hospitalization or surgery is needed. HOME CARE INSTRUCTIONS   Do not take laxatives unless directed by your caregiver.   Take pain medicine only as directed by your caregiver.   Only take over-the-counter or prescription medicines for pain, discomfort, or fever as directed by your caregiver.   Try a clear liquid diet (broth, tea, or water) for as long as directed by your caregiver. Slowly move to a bland diet as tolerated.  SEEK IMMEDIATE MEDICAL CARE IF:   The pain does not go away.   You have a fever.   You keep throwing up (vomiting).   The pain is felt only in portions of the abdomen. Pain in the right side could possibly be appendicitis. In an adult, pain in the left lower portion of the abdomen could be colitis or diverticulitis.   You pass bloody or black tarry stools.  MAKE SURE YOU:   Understand these instructions.   Will watch your condition.   Will get help right away if you are not doing well or get worse.  Document Released: 03/22/2005 Document Revised: 06/01/2011 Document Reviewed: 01/29/2008 [facility] Patient Information 2012 [place], [place].   You may take the oxycodone prescribed for pain relief.  This will make you drowsy - do not drive within 4 hours of taking this medication.

## 2011-10-27 ENCOUNTER — Encounter: Payer: Self-pay | Admitting: Urgent Care

## 2011-10-27 ENCOUNTER — Ambulatory Visit (INDEPENDENT_AMBULATORY_CARE_PROVIDER_SITE_OTHER): Payer: Medicaid Other | Admitting: Urgent Care

## 2011-10-27 VITALS — BP 138/87 | HR 96 | Temp 98.0°F | Ht 70.0 in | Wt 224.8 lb

## 2011-10-27 DIAGNOSIS — K760 Fatty (change of) liver, not elsewhere classified: Secondary | ICD-10-CM

## 2011-10-27 DIAGNOSIS — R1011 Right upper quadrant pain: Secondary | ICD-10-CM

## 2011-10-27 DIAGNOSIS — K7689 Other specified diseases of liver: Secondary | ICD-10-CM

## 2011-10-27 DIAGNOSIS — R110 Nausea: Secondary | ICD-10-CM

## 2011-10-27 DIAGNOSIS — K219 Gastro-esophageal reflux disease without esophagitis: Secondary | ICD-10-CM

## 2011-10-27 MED ORDER — SUCRALFATE 1 GM/10ML PO SUSP: 1.0000 g | Freq: Four times a day (QID) | ORAL | Status: DC

## 2011-10-27 MED ORDER — OMEPRAZOLE 20 MG PO CPDR
20.0000 mg | DELAYED_RELEASE_CAPSULE | Freq: Every day | ORAL | Status: DC
Start: 2011-10-27 — End: 2011-11-06

## 2011-10-27 NOTE — Progress Notes (Signed)
Referring Provider: Primary Care Physician:  Avon Gully, MD, MD Primary Gastroenterologist:  Dr. Jena Gauss  Chief Complaint  Patient presents with  . Abdominal Pain    HPI:  Bradley Hays is a 41 y.o. male here for further evaluation of abdominal pain.  Seen in ER at [facility] yesterday for this. C/o RUQ pain x 4 weeks, radiates through to his back.  2 days ago, severe 10/10 pain started.  "Cant sleep."  Nausea secondary to pain.  Denies vomiting.  Denies fever or chills.  No ill contacts.  BM normal without rectal bleeding or melena.  Denies constipation or diarrhea.  Weight stable.  Appetite good.  Hx GERD but no further problems s/p Nissen fundoplication until now.  ABD US showed no source of pain, fatty liver, in done yesterday.  On Goodys 1-2 per day almost every day, tried Ibuprofen 600mg -800mg  for 2 days.    Recent Results (from the past 336 hour(s))  CBC   Collection Time   10/26/11  3:17 PM      Component Value Range   WBC 9.2  4.0 - 10.5 (K/uL)   RBC 4.70  4.22 - 5.81 (MIL/uL)   Hemoglobin 14.9  13.0 - 17.0 (g/dL)   HCT 16.1  09.6 - 04.5 (%)   MCV 91.3  78.0 - 100.0 (fL)   MCH 31.7  26.0 - 34.0 (pg)   MCHC 34.7  30.0 - 36.0 (g/dL)   RDW 40.9  81.1 - 91.4 (%)   Platelets 187  150 - 400 (K/uL)  DIFFERENTIAL   Collection Time   10/26/11  3:17 PM      Component Value Range   Neutrophils Relative 66  43 - 77 (%)   Neutro Abs 6.1  1.7 - 7.7 (K/uL)   Lymphocytes Relative 20  12 - 46 (%)   Lymphs Abs 1.8  0.7 - 4.0 (K/uL)   Monocytes Relative 13 (*) 3 - 12 (%)   Monocytes Absolute 1.2 (*) 0.1 - 1.0 (K/uL)   Eosinophils Relative 1  0 - 5 (%)   Eosinophils Absolute 0.1  0.0 - 0.7 (K/uL)   Basophils Relative 0  0 - 1 (%)   Basophils Absolute 0.0  0.0 - 0.1 (K/uL)  COMPREHENSIVE METABOLIC PANEL   Collection Time   10/26/11  3:17 PM      Component Value Range   Sodium 138  135 - 145 (mEq/L)   Potassium 4.1  3.5 - 5.1 (mEq/L)   Chloride 103  96 - 112 (mEq/L)   CO2 24  19 - 32 (mEq/L)     Glucose, Bld 92  70 - 99 (mg/dL)   BUN 15  6 - 23 (mg/dL)   Creatinine, Ser 7.82  0.50 - 1.35 (mg/dL)   Calcium 9.3  8.4 - 95.6 (mg/dL)   Total Protein 7.0  6.0 - 8.3 (g/dL)   Albumin 3.8  3.5 - 5.2 (g/dL)   AST 23  0 - 37 (U/L)   ALT 53  0 - 53 (U/L)   Alkaline Phosphatase 53  39 - 117 (U/L)   Total Bilirubin 0.3  0.3 - 1.2 (mg/dL)   GFR calc non Af Amer 86 (*) >90 (mL/min)   GFR calc Af Amer >90  >90 (mL/min)  LIPASE, BLOOD   Collection Time   10/26/11  3:17 PM      Component Value Range   Lipase 27  11 - 59 (U/L)     Past Medical History  Diagnosis Date  .  Arteriosclerotic cardiovascular disease (ASCVD) 11/10/09    Rothbart-BMS to circumflex posterior acute MI in the setting of cocaine use, peak CK of 3590, MB of 511 and troponin of 48; posterolateral hypokinesis with EF of 50% and total obstruction of circumflex; [facility] in 09/2010 40 chest pain, d-dimer of 1.57 with negative CT; EF of 45% with negative stress echo  . Hypertension   . Hyperlipidemia     lipid profile in 2011:185, 169, 37, 114; 2012: Total cholesterol of 311 and triglycerides of 1730  . Tobacco abuse     40 pack years; 0.5 PPD  . Gastroesophageal reflux disease   . Cocaine abuse     remote, quit 2010  . 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 MI, old     Past Surgical History  Procedure Date  . Appendectomy 2009  . Knee arthroscopy   . Coronary stent placement 2011    bare metal  . Nissen fundoplication 1992    Current Outpatient Prescriptions  Medication Sig Dispense Refill  . ALPRAZolam (XANAX) 0.5 MG tablet Take 1 mg by mouth at bedtime.      Marland Kitchen aspirin 81 MG tablet Take 81 mg by mouth daily.      . Aspirin-Acetaminophen-Caffeine (GOODY HEADACHE PO) Take 1 packet by mouth daily as needed. Pain       . atorvastatin (LIPITOR) 80 MG tablet Take 1 tablet (80 mg total) by mouth daily.  30 tablet  12  . lisinopril-hydrochlorothiazide (PRINZIDE,ZESTORETIC) 20-25 MG per tablet Take  1 tablet by mouth daily.  30 tablet  12   No current facility-administered medications for this visit.   Facility-Administered Medications Ordered in Other Visits  Medication Dose Route Frequency Provider Last Rate Last Dose  . HYDROmorphone (DILAUDID) injection 1 mg  1 mg Intravenous Once Burgess Amor, PA   1 mg at 10/26/11 1550  . morphine 4 MG/ML injection 4 mg  4 mg Intravenous Once Burgess Amor, PA   4 mg at 10/26/11 1518  . ondansetron (ZOFRAN) injection 4 mg  4 mg Intravenous Once Burgess Amor, PA   4 mg at 10/26/11 1518    Allergies as of 10/27/2011 - Review Complete 10/27/2011  Allergen Reaction Noted  . Percocet (oxycodone-acetaminophen) Itching 10/27/2011    Family History:There is no known family history of colorectal carcinoma , liver disease, or inflammatory bowel disease.  Problem Relation Age of Onset  . Heart disease    . Arthritis    . Lung disease    . Cancer    . Asthma    . Kidney disease      History   Social History  . Marital Status: Married    Spouse Name: N/A    Number of Children: 3  . Years of Education: N/A   Occupational History  . unemployed x 1 yr, Personal assistant    Social History Main Topics  . Smoking status: Current Everyday Smoker -- 0.5 packs/day for 26 years    Types: Cigarettes  . Smokeless tobacco: Never Used  . Alcohol Use: No     1 beer q 2-3 weeks  . Drug Use: No     remote cocaine, quit 2003  . Sexually Active: Not on file   Other Topics Concern  . Not on file   Social History Narrative   Lives w/ wife & 2 sons, 1 daughter lives w/ her mother3 grown step-children   Review of Systems: Gen:see HPI CV: Denies  chest pain, angina, palpitations, syncope, orthopnea, PND, peripheral edema, and claudication. Resp: Denies dyspnea at rest, dyspnea with exercise, cough, sputum, wheezing, coughing up blood, and pleurisy. GI: Denies vomiting blood, jaundice, and fecal incontinence.   Denies dysphagia or odynophagia. GU : Denies  urinary burning, blood in urine, urinary frequency, urinary hesitancy, nocturnal urination, and urinary incontinence. MS: Denies joint pain, limitation of movement, and swelling, stiffness, low back pain, extremity pain. Denies muscle weakness, cramps, atrophy.  Derm: Denies rash, itching, dry skin, hives, moles, warts, or unhealing ulcers.  Psych: Denies depression, anxiety, memory loss, suicidal ideation, hallucinations, paranoia, and confusion. Heme: Denies bruising, bleeding, and enlarged lymph nodes. Neuro:  Denies any headaches, dizziness, paresthesias. Endo:  Denies any problems with DM, thyroid, adrenal function.  Physical Exam: BP 138/87  Pulse 96  Temp(Src) 98 F (36.7 C) (Temporal)  Ht 5\' 10"  (1.778 m)  Wt 224 lb 12.8 oz (101.969 kg)  BMI 32.26 kg/m2 General:   Alert,  Well-developed, well-nourished, pleasant and cooperative in NAD Head:  Normocephalic and atraumatic. Eyes:  Sclera clear, no icterus.   Conjunctiva pink. Ears:  Normal auditory acuity. Nose:  No deformity, discharge, or lesions. Mouth:  No deformity or lesions,oropharynx pink & moist. Neck:  Supple; no masses or thyromegaly. Lungs:  Clear throughout to auscultation.   No wheezes, crackles, or rhonchi. No acute distress. Heart:  Regular rate and rhythm; no murmurs, clicks, rubs,  or gallops. Abdomen:  Normal bowel sounds.  No bruits.  Soft, non-tender and non-distended without masses, hepatosplenomegaly or hernias noted.  No guarding or rebound tenderness.   Rectal:  Deferred. Msk:  Symmetrical without gross deformities. Normal posture. Pulses:  Normal pulses noted. Extremities:  No clubbing or edema. Neurologic:  Alert and oriented x4;  grossly normal neurologically. Skin:  Intact without significant lesions or rashes. Lymph Nodes:  No significant cervical adenopathy. Psych:  Alert and cooperative. Normal mood and affect.

## 2011-10-27 NOTE — Patient Instructions (Addendum)
Stop Goody's powders. Stop ibuprofen. Begin omeprazole 20 mg daily. Carafate 1 g four times per day for pain.  Stop 24 hrs before your EGD. You need an EGD (upper endoscopy) with Dr Jena Gauss To ER if severe pain  Instructions for fatty liver: Recommend 1-2# weight loss per week until ideal body weight through exercise & diet. Low fat/cholesterol diet. Gradually increase exercise from 15 min daily up to 1 hr per day 5 days/week. Limit alcohol use.  Abdominal Pain Abdominal pain can be caused by many things. Your caregiver decides the seriousness of your pain by an examination and possibly blood tests and X-rays. Many cases can be observed and treated at home. Most abdominal pain is not caused by a disease and will probably improve without treatment. However, in many cases, more time must pass before a clear cause of the pain can be found. Before that point, it may not be known if you need more testing, or if hospitalization or surgery is needed. HOME CARE INSTRUCTIONS   Do not take laxatives unless directed by your caregiver.   Take pain medicine only as directed by your caregiver.   Only take over-the-counter or prescription medicines for pain, discomfort, or fever as directed by your caregiver.   Try a clear liquid diet (broth, tea, or water) for as long as directed by your caregiver. Slowly move to a bland diet as tolerated.  SEEK IMMEDIATE MEDICAL CARE IF:   The pain does not go away.   You have a fever.   You keep throwing up (vomiting).   The pain is felt only in portions of the abdomen. Pain in the right side could possibly be appendicitis. In an adult, pain in the left lower portion of the abdomen could be colitis or diverticulitis.   You pass bloody or black tarry stools.  MAKE SURE YOU:   Understand these instructions.   Will watch your condition.   Will get help right away if you are not doing well or get worse.  Document Released: 03/22/2005 Document Revised:  06/01/2011 Document Reviewed: 01/29/2008 [facility] Patient Information 2012 [place], [place].

## 2011-10-27 NOTE — Assessment & Plan Note (Addendum)
Bradley Hays is a pleasant 41 y.o. male with c/o severe RUQ pain that radiates to back with nausea.  Hx chronic GERD s/p Nissen fundoplication years ago.  Recent abdominal ultrasound & labs negative for biliary component, pancreatitis, AAA.  No change in bowel habits.  Trial PPI & carafate for possible NSAID-induced gastitis, PUD, poorly controlled GERD, possible slipped Nissen, cannot r/o h pylori.   EGD for further evaluation w/ Dr Jena Gauss.  I have discussed risks & benefits which include, but are not limited to, bleeding, infection, perforation & drug reaction.  The patient agrees with this plan & written consent will be obtained.    Stop Goody's powders. Stop ibuprofen. Begin omeprazole 20 mg daily. Carafate 1 g four times per day for pain.  Stop 24 hrs before your EGD. ABD pain literature To ER if severe pain

## 2011-10-27 NOTE — Assessment & Plan Note (Signed)
Incidental fatty liver seen on ultrasound in setting of normal LFTs.  Discussed w/ pt.  Recommend 1-2# weight loss per week until ideal body weight through exercise & diet. Low fat/cholesterol diet. Gradually increase exercise from 15 min daily up to 1 hr per day 5 days/week. Limit alcohol use.

## 2011-10-28 NOTE — ED Provider Notes (Signed)
History     CSN: [id number]  Arrival date & time 10/26/11  1356   First MD Initiated Contact with Patient 10/26/11 1409      Chief Complaint  Patient presents with  . Rib Injury    (Consider location/radiation/quality/duration/timing/severity/associated sxs/prior treatment) HPI Comments: Patient presents with right lower rib pain present for the past month,  Constant and dull,  But worsened to severe sharp pain over the past 24 hours which kept him awake last night.  He denies trauma.  Patient is a 41 y.o. male presenting with abdominal pain. The history is provided by the patient.  Abdominal Pain The primary symptoms of the illness include abdominal pain. The primary symptoms of the illness do not include fever, shortness of breath or nausea. The onset of the illness was gradual. The problem has been rapidly worsening.  The severity of the abdominal pain is 8/10. The abdominal pain is relieved by nothing. The abdominal pain is exacerbated by certain positions (Lying down makes the pain worse.  He slept last night propped up in a chair.).  The patient has not had a change in bowel habit. Symptoms associated with the illness do not include chills, anorexia, diaphoresis, heartburn, constipation, hematuria, frequency or back pain. Associated symptoms comments: He denies nausea,  Vomiting,  Fever.  Pain is not triggered by meals. It is worse with movement and certain positions, not worsened with deep inspiration.  He denies sob.. Significant associated medical issues include GERD. Significant associated medical issues do not include PUD, gallstones or liver disease.    Past Medical History  Diagnosis Date  . 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 Hypertension   . Hyperlipidemia     lipid profile in 2011:185, 169, 37, 114; 2012: Total cholesterol of 311 and triglycerides of 1730  . Tobacco abuse     40 pack years; 0.5 PPD  . Gastroesophageal reflux disease   . Cocaine abuse     remote, quit 2010  . 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 MI, old     Past Surgical History  Procedure Date  . Appendectomy 2009  . Knee arthroscopy   . Coronary stent placement 2011    bare metal  . Nissen fundoplication 1992    Family History  Problem Relation Age of Onset  . Heart disease    . Arthritis    . Lung disease    . Cancer    . Asthma    . Kidney disease      History  Substance Use Topics  . Smoking status: Current Everyday Smoker -- 0.5 packs/day for 26 years    Types: Cigarettes  . Smokeless tobacco: Never Used  . Alcohol Use: No     1 beer q 2-3 weeks      Review of Systems  Constitutional: Negative for fever, chills and diaphoresis.  HENT: Negative for congestion, sore throat and neck pain.   Eyes: Negative.   Respiratory: Negative for chest tightness, shortness of breath, wheezing and stridor.   Cardiovascular: Positive for chest pain. Negative for palpitations and leg swelling.  Gastrointestinal: Positive for  abdominal pain. Negative for heartburn, nausea, constipation and anorexia.  Genitourinary: Negative.  Negative for frequency and hematuria.  Musculoskeletal: Negative for back pain, joint swelling and arthralgias.  Skin: Negative.  Negative for rash and wound.  Neurological: Negative for dizziness, weakness, light-headedness, numbness and headaches.  Hematological: Negative.   Psychiatric/Behavioral: Negative.     Allergies  Percocet  Home Medications   Current Outpatient Rx  Name Route Sig Dispense Refill  . ALPRAZOLAM 0.5 MG PO TABS Oral Take 1 mg by mouth at bedtime.    . ASPIRIN 81 MG PO TABS  Oral Take 81 mg by mouth daily.    Marlin Canary HEADACHE PO Oral Take 1 packet by mouth daily as needed. Pain     . ATORVASTATIN CALCIUM 80 MG PO TABS Oral Take 1 tablet (80 mg total) by mouth daily. 30 tablet 12  . LISINOPRIL-HYDROCHLOROTHIAZIDE 20-25 MG PO TABS Oral Take 1 tablet by mouth daily. 30 tablet 12  . OMEPRAZOLE 20 MG PO CPDR Oral Take 1 capsule (20 mg total) by mouth daily. 30 capsule 1  . SUCRALFATE 1 GM/10ML PO SUSP Oral Take 10 mLs (1 g total) by mouth 4 (four) times daily. For PAIN 420 mL 0    BP 110/40  Pulse 75  Temp 97.5 F (36.4 C)  Resp 20  Ht 5\' 11"  (1.803 m)  Wt 220 lb (99.791 kg)  BMI 30.68 kg/m2  SpO2 96%  Physical Exam  Nursing note and vitals reviewed. Constitutional: He appears well-developed and well-nourished.  HENT:  Head: Normocephalic and atraumatic.  Eyes: Conjunctivae are normal.  Neck: Normal range of motion.  Cardiovascular: Normal rate, regular rhythm, normal heart sounds and intact distal pulses.   Pulmonary/Chest: Effort normal and breath sounds normal. He has no wheezes.  Abdominal: Soft. Bowel sounds are normal. He exhibits no distension, no pulsatile midline mass and no mass. There is no hepatosplenomegaly. There is tenderness. There is no rigidity, no rebound, no guarding, no CVA tenderness and negative Murphy's sign.       Pain located in RUQ and right lower rib cage,  But not worsened with palpation.  Negative murphy sign.  Musculoskeletal: Normal range of motion.  Neurological: He is alert.  Skin: Skin is warm and dry.  Psychiatric: He has a normal mood and affect.    ED Course  Procedures (including critical care time)  Labs Reviewed  DIFFERENTIAL - Abnormal; Notable for the following:    Monocytes Relative 13 (*)    Monocytes Absolute 1.2 (*)    All other components within normal limits  COMPREHENSIVE METABOLIC PANEL - Abnormal; Notable for the following:    GFR calc non Af Amer 86 (*)    All other components within normal  limits  CBC  LIPASE, BLOOD  LAB REPORT - SCANNED   No results found.   1. Abdominal pain   2. Fatty liver       MDM  Patient discussed with Dr. Adriana Simas prior to dc home.  Fatty liver on [place] with no red flag lab results.  Patient remains stable in ed,  VSS.  Comfortable at time of discharge.  Right chest and ruq pain of unclear etiology,  Pt referred to Dr. Jena Gauss for f/u,  ? Pain associated with fatty liver?Marland Kitchen  No respiratory complaint,  Normal vital signs,  Doubt PE.  The patient appears reasonably screened and/or stabilized for discharge and I doubt any other medical condition or other [facility] requiring further screening, evaluation, or treatment  in the ED at this time prior to discharge.        Burgess Amor, [place] 10/30/11 [phone]

## 2011-10-30 NOTE — Progress Notes (Signed)
Faxed to PCP

## 2011-10-30 NOTE — ED Provider Notes (Signed)
Medical screening examination/treatment/procedure(s) were performed by non-physician practitioner and as supervising physician I was immediately available for consultation/collaboration.  Donnetta Hutching, MD 10/30/11 2200

## 2011-10-31 ENCOUNTER — Encounter: Payer: Self-pay | Admitting: *Deleted

## 2011-11-06 ENCOUNTER — Encounter (HOSPITAL_COMMUNITY): Payer: Self-pay

## 2011-11-06 ENCOUNTER — Telehealth: Payer: Self-pay | Admitting: Gastroenterology

## 2011-11-06 NOTE — Telephone Encounter (Signed)
EGD time changed to 10:05- LMOVM with arrival time of 9:05

## 2011-11-08 MED ORDER — SODIUM CHLORIDE 0.45 % IV SOLN: Freq: Once | INTRAVENOUS | Status: DC

## 2011-11-09 ENCOUNTER — Encounter (HOSPITAL_COMMUNITY): Admission: RE | Payer: Self-pay | Source: Ambulatory Visit

## 2011-11-09 ENCOUNTER — Ambulatory Visit (HOSPITAL_COMMUNITY): Admission: RE | Admit: 2011-11-09 | Payer: Medicaid Other | Source: Ambulatory Visit | Admitting: Internal Medicine

## 2011-11-09 SURGERY — EGD (ESOPHAGOGASTRODUODENOSCOPY)
Anesthesia: Moderate Sedation

## 2012-03-06 ENCOUNTER — Emergency Department (HOSPITAL_COMMUNITY): Payer: Medicaid Other

## 2012-03-06 ENCOUNTER — Encounter: Payer: Self-pay | Admitting: Cardiology

## 2012-03-06 ENCOUNTER — Encounter (HOSPITAL_COMMUNITY): Payer: Self-pay

## 2012-03-06 ENCOUNTER — Observation Stay (HOSPITAL_COMMUNITY)
Admission: EM | Admit: 2012-03-06 | Discharge: 2012-03-06 | DRG: 313 | Discharge status: Against Medical Advice | Payer: Medicaid Other | Attending: Internal Medicine | Admitting: Internal Medicine

## 2012-03-06 DIAGNOSIS — I10 Essential (primary) hypertension: Secondary | ICD-10-CM | POA: Insufficient documentation

## 2012-03-06 DIAGNOSIS — R6889 Other general symptoms and signs: Secondary | ICD-10-CM

## 2012-03-06 DIAGNOSIS — R079 Chest pain, unspecified: Principal | ICD-10-CM | POA: Diagnosis present

## 2012-03-06 DIAGNOSIS — I2510 Atherosclerotic heart disease of native coronary artery without angina pectoris: Secondary | ICD-10-CM | POA: Insufficient documentation

## 2012-03-06 DIAGNOSIS — I252 Old myocardial infarction: Secondary | ICD-10-CM | POA: Insufficient documentation

## 2012-03-06 DIAGNOSIS — F17200 Nicotine dependence, unspecified, uncomplicated: Secondary | ICD-10-CM | POA: Insufficient documentation

## 2012-03-06 LAB — CBC WITH DIFFERENTIAL/PLATELET
Basophils Relative: 0 % (ref 0–1)
Hemoglobin: 15.4 g/dL (ref 13.0–17.0)
MCHC: 35.4 g/dL (ref 30.0–36.0)
Monocytes Relative: 11 % (ref 3–12)
Neutro Abs: 5.9 10*3/uL (ref 1.7–7.7)
Neutrophils Relative %: 65 % (ref 43–77)
RBC: 4.88 MIL/uL (ref 4.22–5.81)
WBC: 9.1 10*3/uL (ref 4.0–10.5)

## 2012-03-06 LAB — BASIC METABOLIC PANEL
BUN: 13 mg/dL (ref 6–23)
Chloride: 100 mEq/L (ref 96–112)
GFR calc Af Amer: >90 mL/min (ref >90)
Potassium: 3.7 mEq/L (ref 3.5–5.1)

## 2012-03-06 LAB — TROPONIN I: Troponin I: <0.3 ng/mL (ref <0.30)

## 2012-03-06 LAB — RAPID URINE DRUG SCREEN, HOSP PERFORMED
Barbiturates: NOT DETECTED
Benzodiazepines: NOT DETECTED
Cocaine: NOT DETECTED

## 2012-03-06 MED ORDER — NITROGLYCERIN 2 % TD OINT
1.0000 [in_us] | TOPICAL_OINTMENT | Freq: Once | TRANSDERMAL | Status: AC
  Administered 2012-03-06: 1 [in_us] via TOPICAL
  Filled 2012-03-06: qty 1

## 2012-03-06 MED ORDER — HYDROMORPHONE HCL PF 1 MG/ML IJ SOLN
1.0000 mg | Freq: Once | INTRAMUSCULAR | Status: AC
  Administered 2012-03-06: 1 mg via INTRAVENOUS
  Filled 2012-03-06: qty 1

## 2012-03-06 MED ORDER — NITROGLYCERIN 0.4 MG SL SUBL
0.4000 mg | SUBLINGUAL_TABLET | SUBLINGUAL | Status: DC | PRN
  Administered 2012-03-06: 0.4 mg via SUBLINGUAL
  Filled 2012-03-06: qty 25

## 2012-03-06 MED ORDER — ASPIRIN 81 MG PO CHEW
243.0000 mg | CHEWABLE_TABLET | Freq: Once | ORAL | Status: AC
  Administered 2012-03-06: 243 mg via ORAL
  Filled 2012-03-06: qty 3

## 2012-03-06 MED ORDER — SODIUM CHLORIDE 0.9 % IV SOLN
1000.0000 mL | INTRAVENOUS | Status: DC
Start: 2012-03-06 — End: 2012-03-06

## 2012-03-06 MED ORDER — ONDANSETRON HCL 4 MG/2ML IJ SOLN
4.0000 mg | Freq: Once | INTRAMUSCULAR | Status: AC
  Administered 2012-03-06: 4 mg via INTRAVENOUS
  Filled 2012-03-06: qty 2

## 2012-03-06 MED ORDER — SODIUM CHLORIDE 0.9 % IV SOLN
INTRAVENOUS | Status: DC
  Administered 2012-03-06: 10:00:00 via INTRAVENOUS

## 2012-03-06 NOTE — ED Notes (Signed)
Dr. Gildardo Pounds Cardiology [phone]).  Staff to have him return call to [facility].

## 2012-03-06 NOTE — Progress Notes (Signed)
Pt. Signed AMA papers, IV removed, Pt. Ambulated  To car.

## 2012-03-06 NOTE — Progress Notes (Signed)
Was asked by ED staff to admit this gentleman for further evaluation of chest pain.  Bed request and holding orders were placed.  Patient was seen on the nursing unit.  Patient was seen and examined.  We discussed his plan of care including telemetry monitoring, checking cardiac enzymes and possible cardiology consultation and he voiced agreement and understanding.  After a short while, patient told nursing staff that he had to leave, signed AMA papers and left.

## 2012-03-06 NOTE — ED Provider Notes (Signed)
History     CSN: [id number]  Arrival date & time 03/06/12  2956   First MD Initiated Contact with Patient 03/06/12 [phone]      Chief Complaint  Patient presents with  . Chest Pain    (Consider location/radiation/quality/duration/timing/severity/associated sxs/prior treatment) HPI Comments: Pt started to mow his lawn yesterday afternoon.  He had just gotten started and began having L sided chest pressure that radiates to the L neck.  He also started feeling light headed and sweaty so he stopped.  He has known heart dz.  He had an MI and stent placement ~ 3-4 years ago.  He has been followed by dr. Dietrich Pates.   He took a total of 6 NTG last PM with pain going from 7-8 to a 5/10 but never totally resolving.  He took a total of 4 starting ~ 0300 today and last one ~ 0730 with same results.  He walked ~ 100 yards to a neighbor's house and was very SOB and CP worse.    The history is provided by the patient. No language interpreter was used.    Past Medical History  Diagnosis Date  . Arteriosclerotic cardiovascular disease (ASCVD) 11/10/09    Rothbart-BMS to circumflex posterior acute MI in the setting of cocaine use, peak CK of 3590, MB of 511 and troponin of 48; posterolateral hypokinesis with EF of 50% and total obstruction of circumflex; [facility] in 09/2010 40 chest pain, d-dimer of 1.57 with negative CT; EF of 45% with negative stress echo  . Hypertension   . Hyperlipidemia     lipid profile in 2011:185, 169, 37, 114; 2012: Total cholesterol of 311 and triglycerides of 1730  . Tobacco abuse     40 pack years; 0.5 PPD  . Gastroesophageal reflux disease   . Cocaine abuse     remote, quit 2010  . 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 MI, old     Past Surgical History  Procedure Date  . Appendectomy 2009  . Knee arthroscopy   . Coronary stent placement 2011    bare metal  . Nissen fundoplication 1992    Family History  Problem Relation Age of Onset  .  Heart disease    . Arthritis    . Lung disease    . Cancer    . Asthma    . Kidney disease      History  Substance Use Topics  . Smoking status: Current Every Day Smoker -- 0.5 packs/day for 26 years    Types: Cigarettes  . Smokeless tobacco: Never Used  . Alcohol Use: No     1 beer q 2-3 weeks      Review of Systems  Constitutional: Positive for diaphoresis.  Respiratory: Positive for shortness of breath. Negative for cough, wheezing and stridor.   Cardiovascular: Positive for chest pain.  Gastrointestinal: Negative for nausea and vomiting.  Neurological: Positive for light-headedness.  All other systems reviewed and are negative.    Allergies  Review of patient's allergies indicates no known allergies.  Home Medications   Current Outpatient Rx  Name Route Sig Dispense Refill  . ALPRAZOLAM 0.5 MG PO TABS Oral Take 0.5-1 mg by mouth daily as needed. For anxiety    . ASPIRIN 81 MG PO TABS Oral Take 81 mg by mouth daily.    Marlin Canary HEADACHE PO Oral Take 1 packet by mouth as needed. Pain    . ATORVASTATIN CALCIUM 80  MG PO TABS Oral Take 1 tablet (80 mg total) by mouth daily. 30 tablet 12  . LISINOPRIL-HYDROCHLOROTHIAZIDE 20-25 MG PO TABS Oral Take 1 tablet by mouth daily. 30 tablet 12  . OXYCODONE-ACETAMINOPHEN 5-325 MG PO TABS Oral Take 1 tablet by mouth every 4 (four) hours as needed. For pain      Ht 5\' 11"  (1.803 m)  Wt 220 lb (99.791 kg)  BMI 30.68 kg/m2  Physical Exam  Nursing note and vitals reviewed. Constitutional: He is oriented to person, place, and time. He appears well-developed and well-nourished.  HENT:  Head: Normocephalic and atraumatic.  Eyes: EOM are normal.  Neck: Normal range of motion.  Cardiovascular: Normal rate, regular rhythm, normal heart sounds and intact distal pulses.  PMI is not displaced.  Exam reveals no decreased pulses.   Pulmonary/Chest: Effort normal and breath sounds normal. No accessory muscle usage. Not tachypneic. No  respiratory distress. He has no decreased breath sounds. He has no wheezes. He has no rhonchi. He has no rales. He exhibits no tenderness.  Abdominal: Soft. He exhibits no distension. There is no tenderness.  Musculoskeletal: Normal range of motion.  Neurological: He is alert and oriented to person, place, and time. No cranial nerve deficit. Coordination normal.  Skin: Skin is warm and dry.  Psychiatric: He has a normal mood and affect. Judgment normal.    ED Course  Procedures (including critical care time)   Labs Reviewed  CBC WITH DIFFERENTIAL  BASIC METABOLIC PANEL  TROPONIN I   No results found.   No diagnosis found.   Date: 03/06/2012  Rate: 83  Rhythm: normal sinus rhythm  QRS Axis: normal  Intervals: normal  ST/T Wave abnormalities: normal  Conduction Disutrbances:none  Narrative Interpretation:   Old EKG Reviewed: unchanged  1220-i spoke with dr. Dietrich Pates.  He feels that the pt would be stable to admit to [facility] and he would consult.  i have a call into the hospitalist.  MDM  Pt admitted to hospitalist's.        Evalina Field, [place] 03/28/12 [phone]

## 2012-03-06 NOTE — Progress Notes (Signed)
Pt. Called RN to room, pt. Stated "I've got to go home, I have no one to watch my kids,I need to leave", Dr. Kerry Hough notified , Pt. To leave AMA.

## 2012-03-06 NOTE — ED Notes (Signed)
Pt reports started having chest pain yesterday while push mowing his yard.  Took a total of 6 nitroglycerins yesterday.  Reports nitro helped but pain would come back.  Has taken 4 nitroglycerins this morning since 3am.    Also took 1 baby aspirin around 0630.  C/O SOB and pain radiating into left side of neck.

## 2012-03-29 NOTE — ED Provider Notes (Signed)
Medical screening examination/treatment/procedure(s) were conducted as a shared visit with non-physician practitioner(s) and myself.  I personally evaluated the patient during the encounter.   Patient has known coronary artery disease with good history for angina. Admit to hospitalist. Discussed with cardiologist  Donnetta Hutching, MD 03/29/12 [phone]

## 2012-04-06 ENCOUNTER — Encounter (HOSPITAL_COMMUNITY): Payer: Self-pay | Admitting: *Deleted

## 2012-04-06 ENCOUNTER — Emergency Department (HOSPITAL_COMMUNITY)
Admission: EM | Admit: 2012-04-06 | Discharge: 2012-04-06 | Disposition: A | Discharge status: Home / Self Care | Payer: Medicaid Other | Attending: Emergency Medicine | Admitting: Emergency Medicine

## 2012-04-06 DIAGNOSIS — F17200 Nicotine dependence, unspecified, uncomplicated: Secondary | ICD-10-CM | POA: Insufficient documentation

## 2012-04-06 DIAGNOSIS — I10 Essential (primary) hypertension: Secondary | ICD-10-CM | POA: Insufficient documentation

## 2012-04-06 DIAGNOSIS — I2510 Atherosclerotic heart disease of native coronary artery without angina pectoris: Secondary | ICD-10-CM | POA: Insufficient documentation

## 2012-04-06 DIAGNOSIS — E785 Hyperlipidemia, unspecified: Secondary | ICD-10-CM | POA: Insufficient documentation

## 2012-04-06 DIAGNOSIS — L723 Sebaceous cyst: Secondary | ICD-10-CM | POA: Insufficient documentation

## 2012-04-06 DIAGNOSIS — I252 Old myocardial infarction: Secondary | ICD-10-CM | POA: Insufficient documentation

## 2012-04-06 DIAGNOSIS — K219 Gastro-esophageal reflux disease without esophagitis: Secondary | ICD-10-CM | POA: Insufficient documentation

## 2012-04-06 DIAGNOSIS — L729 Follicular cyst of the skin and subcutaneous tissue, unspecified: Secondary | ICD-10-CM

## 2012-04-06 MED ORDER — DOXYCYCLINE HYCLATE 100 MG PO CAPS: 100.0000 mg | ORAL_CAPSULE | Freq: Two times a day (BID) | ORAL | Status: DC

## 2012-04-06 MED ORDER — HYDROCODONE-ACETAMINOPHEN 5-325 MG PO TABS: 1.0000 | ORAL_TABLET | Freq: Four times a day (QID) | ORAL | Status: AC | PRN

## 2012-04-06 NOTE — ED Notes (Signed)
Pt felt something like a prick to left axillary area yesterday, today with swelling and redness

## 2012-04-06 NOTE — ED Provider Notes (Signed)
History  This chart was scribed for Shelda Jakes, MD by Shari Heritage. The patient was seen in room APA12/APA12. Patient's care was started at 1230.     CSN: [id number]  Arrival date & time 04/06/12  1230   First MD Initiated Contact with Patient 04/06/12 1314      Chief Complaint  Patient presents with  . Insect Bite    (Consider location/radiation/quality/duration/timing/severity/associated sxs/prior treatment) HPI  Bradley Hays is a 41 y.o. male who presents to the Emergency Department complaining of red, raised, swollen area to the skin immediately below the left axilla onset yesterday. Patient denies any other pain, fever, nausea, vomiting, dysuruiabdominal pain, chest pain or SOB.     Past Medical History  Diagnosis Date  . Arteriosclerotic cardiovascular disease (ASCVD) 11/10/09    Rothbart-BMS to circumflex posterior acute MI in the setting of cocaine use, peak CK of 3590, MB of 511 and troponin of 48; posterolateral hypokinesis with EF of 50% and total obstruction of circumflex; [facility] in 09/2010 40 chest pain, d-dimer of 1.57 with negative CT; EF of 45% with negative stress echo  . Hypertension   . Hyperlipidemia     lipid profile in 2011:185, 169, 37, 114; 2012: Total cholesterol of 311 and triglycerides of 1730  . Tobacco abuse     40 pack years; 0.5 PPD  . Gastroesophageal reflux disease   . Cocaine abuse     remote, quit 2010  . 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 MI, old     Past Surgical History  Procedure Date  . Appendectomy 2009  . Knee arthroscopy   . Coronary stent placement 2011    bare metal  . Nissen fundoplication 1992    Family History  Problem Relation Age of Onset  . Heart disease    . Arthritis    . Lung disease    . Cancer    . Asthma    . Kidney disease      History  Substance Use Topics  . Smoking status: Current Every Day Smoker -- 0.5 packs/day for 26 years    Types: Cigarettes  .  Smokeless tobacco: Never Used  . Alcohol Use: Yes     occasionally       Review of Systems  Allergies  Review of patient's allergies indicates no known allergies.  Home Medications   Current Outpatient Rx  Name Route Sig Dispense Refill  . ASPIRIN EC 81 MG PO TBEC Oral Take 81 mg by mouth daily.    . ATORVASTATIN CALCIUM 80 MG PO TABS Oral Take 1 tablet (80 mg total) by mouth daily. 30 tablet 12  . IBUPROFEN 200 MG PO TABS Oral Take 800 mg by mouth every 6 (six) hours as needed. Chest pain.    Marland Kitchen LISINOPRIL-HYDROCHLOROTHIAZIDE 20-25 MG PO TABS Oral Take 1 tablet by mouth daily. 30 tablet 12  . NITROGLYCERIN 0.4 MG SL SUBL Sublingual Place 0.4 mg under the tongue every 5 (five) minutes x 3 doses as needed. Chest pain.    Marland Kitchen DOXYCYCLINE HYCLATE 100 MG PO CAPS Oral Take 1 capsule (100 mg total) by mouth 2 (two) times daily. 14 capsule 0  . HYDROCODONE-ACETAMINOPHEN 5-325 MG PO TABS Oral Take 1-2 tablets by mouth every 6 (six) hours as needed for pain. 14 tablet 0    BP 152/100  Pulse 88  Temp 98.5 F (36.9 C) (Oral)  Resp 18  Ht 5\' 10"  (  1.778 m)  Wt 220 lb (99.791 kg)  BMI 31.57 kg/m2  SpO2 97%  Physical Exam  Nursing note and vitals reviewed. Constitutional: He is oriented to person, place, and time. He appears well-developed and well-nourished.  HENT:  Head: Normocephalic and atraumatic.  Cardiovascular: Normal rate and regular rhythm.   No murmur heard. Pulmonary/Chest: Effort normal and breath sounds normal. No respiratory distress. He has no wheezes. He has no rales.  Abdominal: Soft. Bowel sounds are normal. He exhibits no distension. There is no tenderness. There is no rebound.  Musculoskeletal: Normal range of motion.  Neurological: He is alert and oriented to person, place, and time.  Skin: Skin is warm and dry. There is erythema.       15 cm area of redness with a 5 cm area of induration that is tender below the left axillary lymph nodes. No fluctuance.    Psychiatric: He has a normal mood and affect. His behavior is normal.    ED Course  Procedures (including critical care time) DIAGNOSTIC STUDIES:   COORDINATION OF CARE:     Labs Reviewed - No data to display No results found.   1. Skin cyst       MDM  Patient with a skin infection left axillary area most likely a infected skin cysts could be an insect bite. No evidence of necrosis. No evidence of abscess cavity to drain at this point in time. We'll treat with doxycycline and pain medicine.      I personally performed the services described in this documentation, which was scribed in my presence. The recorded information has been reviewed and considered.     Shelda Jakes, MD 04/06/12 1414

## 2012-04-06 NOTE — ED Notes (Signed)
Water given  

## 2012-04-08 ENCOUNTER — Encounter (HOSPITAL_COMMUNITY): Payer: Self-pay | Admitting: *Deleted

## 2012-04-08 ENCOUNTER — Emergency Department (HOSPITAL_COMMUNITY)
Admission: EM | Admit: 2012-04-08 | Discharge: 2012-04-08 | Disposition: A | Discharge status: Home / Self Care | Payer: Medicaid Other | Attending: Emergency Medicine | Admitting: Emergency Medicine

## 2012-04-08 DIAGNOSIS — I2510 Atherosclerotic heart disease of native coronary artery without angina pectoris: Secondary | ICD-10-CM | POA: Insufficient documentation

## 2012-04-08 DIAGNOSIS — F17200 Nicotine dependence, unspecified, uncomplicated: Secondary | ICD-10-CM | POA: Insufficient documentation

## 2012-04-08 DIAGNOSIS — IMO0002 Reserved for concepts with insufficient information to code with codable children: Secondary | ICD-10-CM | POA: Insufficient documentation

## 2012-04-08 DIAGNOSIS — K219 Gastro-esophageal reflux disease without esophagitis: Secondary | ICD-10-CM | POA: Insufficient documentation

## 2012-04-08 DIAGNOSIS — L0291 Cutaneous abscess, unspecified: Secondary | ICD-10-CM

## 2012-04-08 DIAGNOSIS — Z8249 Family history of ischemic heart disease and other diseases of the circulatory system: Secondary | ICD-10-CM | POA: Insufficient documentation

## 2012-04-08 DIAGNOSIS — Z7982 Long term (current) use of aspirin: Secondary | ICD-10-CM | POA: Insufficient documentation

## 2012-04-08 DIAGNOSIS — I10 Essential (primary) hypertension: Secondary | ICD-10-CM | POA: Insufficient documentation

## 2012-04-08 DIAGNOSIS — I252 Old myocardial infarction: Secondary | ICD-10-CM | POA: Insufficient documentation

## 2012-04-08 MED ORDER — OXYCODONE-ACETAMINOPHEN 5-325 MG PO TABS: 1.0000 | ORAL_TABLET | ORAL | Status: AC | PRN

## 2012-04-08 MED ORDER — OXYCODONE-ACETAMINOPHEN 5-325 MG PO TABS
1.0000 | ORAL_TABLET | Freq: Once | ORAL | Status: AC
  Administered 2012-04-08: 1 via ORAL
  Filled 2012-04-08: qty 1

## 2012-04-08 MED ORDER — SULFAMETHOXAZOLE-TRIMETHOPRIM 800-160 MG PO TABS: 1.0000 | ORAL_TABLET | Freq: Two times a day (BID) | ORAL | Status: DC

## 2012-04-08 MED ORDER — LIDOCAINE HCL (PF) 2 % IJ SOLN
10.0000 mL | Freq: Once | INTRAMUSCULAR | Status: AC
  Administered 2012-04-08: 10 mL via INTRADERMAL
  Filled 2012-04-08: qty 10

## 2012-04-08 NOTE — ED Notes (Signed)
Pt has abscess to left underarm area, was seen in er on Saturday, has not gotten any better.

## 2012-04-08 NOTE — ED Provider Notes (Signed)
History     CSN: [id number]  Arrival date & time 04/08/12  1144   First MD Initiated Contact with Patient 04/08/12 1226      Chief Complaint  Patient presents with  . Abscess    (Consider location/radiation/quality/duration/timing/severity/associated sxs/prior treatment) HPI Comments: Patient complains of pain, redness, and an abscess to the left axilla. He states that he was seen at this emergency department 2 days ago for similar symptoms. He was prescribed pain medication and antibiotics which he states he is taking as directed for the symptoms are not improving. He states the redness and pain have increased. He denies any fever, vomiting, or chills.  Patient is a 41 y.o. male presenting with abscess. The history is provided by the patient.  Abscess  This is a new problem. The current episode started less than one week ago. The onset was gradual. The problem occurs continuously. The problem has been gradually worsening. Affected Location: Left axilla. The problem is moderate. The abscess is characterized by redness, painfulness and swelling. Pertinent negatives include no fever and no vomiting. His past medical history does not include skin abscesses in family. There were no sick contacts. Recently, medical care has been given at this facility. Services received include medications given.    Past Medical History  Diagnosis Date  . Arteriosclerotic cardiovascular disease (ASCVD) 11/10/09    Rothbart-BMS to circumflex posterior acute MI in the setting of cocaine use, peak CK of 3590, MB of 511 and troponin of 48; posterolateral hypokinesis with EF of 50% and total obstruction of circumflex; [facility] in 09/2010 40 chest pain, d-dimer of 1.57 with negative CT; EF of 45% with negative stress echo  . Hypertension   . Hyperlipidemia     lipid profile in 2011:185, 169, 37, 114; 2012: Total cholesterol of 311 and triglycerides of 1730  . Tobacco abuse     40 pack years; 0.5 PPD  .  Gastroesophageal reflux disease   . Cocaine abuse     remote, quit 2010  . 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 MI, old     Past Surgical History  Procedure Date  . Appendectomy 2009  . Knee arthroscopy   . Coronary stent placement 2011    bare metal  . Nissen fundoplication 1992    Family History  Problem Relation Age of Onset  . Heart disease    . Arthritis    . Lung disease    . Cancer    . Asthma    . Kidney disease      History  Substance Use Topics  . Smoking status: Current Every Day Smoker -- 0.5 packs/day for 26 years    Types: Cigarettes  . Smokeless tobacco: Never Used  . Alcohol Use: Yes     occasionally       Review of Systems  Constitutional: Negative for fever, chills, activity change and appetite change.  Gastrointestinal: Negative for nausea and vomiting.  Musculoskeletal: Negative for joint swelling and arthralgias.  Skin: Positive for color change.       Abscess   Neurological: Negative for dizziness, weakness and numbness.  Hematological: Negative for adenopathy.  All other systems reviewed and are negative.    Allergies  Review of patient's allergies indicates no known allergies.  Home Medications   Current Outpatient Rx  Name Route Sig Dispense Refill  . ASPIRIN EC 81 MG PO TBEC Oral Take 81 mg by mouth daily.    Marland Kitchen  ATORVASTATIN CALCIUM 80 MG PO TABS Oral Take 1 tablet (80 mg total) by mouth daily. 30 tablet 12  . DOXYCYCLINE HYCLATE 100 MG PO CAPS Oral Take 100 mg by mouth 2 (two) times daily.    Marland Kitchen HYDROCODONE-ACETAMINOPHEN 5-325 MG PO TABS Oral Take 1-2 tablets by mouth every 6 (six) hours as needed for pain. 14 tablet 0  . IBUPROFEN 200 MG PO TABS Oral Take 800 mg by mouth every 6 (six) hours as needed. Chest pain.    Marland Kitchen LISINOPRIL-HYDROCHLOROTHIAZIDE 20-25 MG PO TABS Oral Take 1 tablet by mouth daily. 30 tablet 12  . NITROGLYCERIN 0.4 MG SL SUBL Sublingual Place 0.4 mg under the tongue every 5 (five) minutes x 3 doses  as needed. Chest pain.      BP 152/86  Pulse 81  Temp 97.7 F (36.5 C) (Oral)  Resp 20  Ht 5' 10.5" (1.791 m)  Wt 220 lb (99.791 kg)  BMI 31.12 kg/m2  SpO2 100%  Physical Exam  Nursing note and vitals reviewed. Constitutional: He is oriented to person, place, and time. He appears well-developed and well-nourished. No distress.  HENT:  Head: Normocephalic and atraumatic.  Neck: Normal range of motion. Neck supple.  Cardiovascular: Normal rate, regular rhythm and normal heart sounds.   Pulmonary/Chest: Effort normal and breath sounds normal.  Musculoskeletal: He exhibits no edema.  Lymphadenopathy:    He has no cervical adenopathy.  Neurological: He is alert and oriented to person, place, and time. He exhibits normal muscle tone. Coordination normal.  Skin: Skin is warm. There is erythema.       Abscess to the left axilla. Moderate fluctuance and induration. Surrounding erythema is also present. No drainage.    Erythema extends beyond the previously marked border.    ED Course  Procedures (including critical care time)  Labs Reviewed - No data to display No results found.      MDM   Previous ED chart was reviewed by me   INCISION AND DRAINAGE Performed by: Pauline Aus L. Consent: Verbal consent obtained. Risks and benefits: risks, benefits and alternatives were discussed Type: abscess  Body area: left axilla Anesthesia: local infiltration  Local anesthetic: lidocaine 2 %  w/o epinephrine  Anesthetic total: 3 ml  Complexity: complex Blunt dissection to break up loculations  Drainage: purulent  Drainage amount: large  Packing material: 1/4 in iodoform gauze  Patient tolerance: Patient tolerated the procedure well with no immediate complications.     Patient to return in 2 days for recheck and packing removal.  Patient has nearly completed a seven-day course of doxycycline so I will begin  Septra DS.  He is well appearing vital signs are  stable.  Prescribed: Septra DS Percocet #20    Roshan Roback L. [place], [place] 04/09/12 1829

## 2012-04-10 ENCOUNTER — Emergency Department (HOSPITAL_COMMUNITY)
Admission: EM | Admit: 2012-04-10 | Discharge: 2012-04-10 | Disposition: A | Discharge status: Home / Self Care | Payer: Medicaid Other | Attending: Emergency Medicine | Admitting: Emergency Medicine

## 2012-04-10 ENCOUNTER — Encounter (HOSPITAL_COMMUNITY): Payer: Self-pay | Admitting: *Deleted

## 2012-04-10 DIAGNOSIS — I10 Essential (primary) hypertension: Secondary | ICD-10-CM | POA: Insufficient documentation

## 2012-04-10 DIAGNOSIS — I2510 Atherosclerotic heart disease of native coronary artery without angina pectoris: Secondary | ICD-10-CM | POA: Insufficient documentation

## 2012-04-10 DIAGNOSIS — I252 Old myocardial infarction: Secondary | ICD-10-CM | POA: Insufficient documentation

## 2012-04-10 DIAGNOSIS — F17200 Nicotine dependence, unspecified, uncomplicated: Secondary | ICD-10-CM | POA: Insufficient documentation

## 2012-04-10 DIAGNOSIS — E785 Hyperlipidemia, unspecified: Secondary | ICD-10-CM | POA: Insufficient documentation

## 2012-04-10 DIAGNOSIS — IMO0002 Reserved for concepts with insufficient information to code with codable children: Secondary | ICD-10-CM | POA: Insufficient documentation

## 2012-04-10 DIAGNOSIS — Z4800 Encounter for change or removal of nonsurgical wound dressing: Secondary | ICD-10-CM | POA: Insufficient documentation

## 2012-04-10 DIAGNOSIS — L0291 Cutaneous abscess, unspecified: Secondary | ICD-10-CM

## 2012-04-10 NOTE — ED Provider Notes (Signed)
History     CSN: [id number]  Arrival date & time 04/10/12  1023   First MD Initiated Contact with Patient 04/10/12 1034      Chief Complaint  Patient presents with  . packing removal      Patient is a 41 y.o. male presenting with abscess. The history is provided by the patient.  Abscess  This is a recurrent problem. The onset was gradual. The problem has been gradually improving. Affected Location: left axilla. The problem is moderate. Pertinent negatives include no fever and no vomiting.  pt here for abscess assessment and packing removal Reports improvement No fever/chills  Past Medical History  Diagnosis Date  . Arteriosclerotic cardiovascular disease (ASCVD) 11/10/09    Rothbart-BMS to circumflex posterior acute MI in the setting of cocaine use, peak CK of 3590, MB of 511 and troponin of 48; posterolateral hypokinesis with EF of 50% and total obstruction of circumflex; [facility] in 09/2010 40 chest pain, d-dimer of 1.57 with negative CT; EF of 45% with negative stress echo  . Hypertension   . Hyperlipidemia     lipid profile in 2011:185, 169, 37, 114; 2012: Total cholesterol of 311 and triglycerides of 1730  . Tobacco abuse     40 pack years; 0.5 PPD  . Gastroesophageal reflux disease   . Cocaine abuse     remote, quit 2010  . 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 MI, old     Past Surgical History  Procedure Date  . Appendectomy 2009  . Knee arthroscopy   . Coronary stent placement 2011    bare metal  . Nissen fundoplication 1992    Family History  Problem Relation Age of Onset  . Heart disease    . Arthritis    . Lung disease    . Cancer    . Asthma    . Kidney disease      History  Substance Use Topics  . Smoking status: Current Every Day Smoker -- 0.5 packs/day for 26 years    Types: Cigarettes  . Smokeless tobacco: Never Used  . Alcohol Use: Yes     occasionally       Review of Systems  Constitutional: Negative for  fever.  Gastrointestinal: Negative for vomiting.    Allergies  Review of patient's allergies indicates no known allergies.  Home Medications   Current Outpatient Rx  Name Route Sig Dispense Refill  . ASPIRIN EC 81 MG PO TBEC Oral Take 81 mg by mouth daily.    . ATORVASTATIN CALCIUM 80 MG PO TABS Oral Take 1 tablet (80 mg total) by mouth daily. 30 tablet 12  . DOXYCYCLINE HYCLATE 100 MG PO CAPS Oral Take 100 mg by mouth 2 (two) times daily.    Marland Kitchen HYDROCODONE-ACETAMINOPHEN 5-325 MG PO TABS Oral Take 1-2 tablets by mouth every 6 (six) hours as needed for pain. 14 tablet 0  . IBUPROFEN 200 MG PO TABS Oral Take 800 mg by mouth every 6 (six) hours as needed. Chest pain.    Marland Kitchen LISINOPRIL-HYDROCHLOROTHIAZIDE 20-25 MG PO TABS Oral Take 1 tablet by mouth daily. 30 tablet 12  . NITROGLYCERIN 0.4 MG SL SUBL Sublingual Place 0.4 mg under the tongue every 5 (five) minutes x 3 doses as needed. Chest pain.    . OXYCODONE-ACETAMINOPHEN 5-325 MG PO TABS Oral Take 1 tablet by mouth every 4 (four) hours as needed for pain. 20 tablet 0  . SULFAMETHOXAZOLE-TRIMETHOPRIM 800-160  MG PO TABS Oral Take 1 tablet by mouth 2 (two) times daily. For 10 days 20 tablet 0    BP 176/83  Pulse 83  Temp 97.5 F (36.4 C) (Oral)  Resp 16  Ht 5\' 10"  (1.778 m)  Wt 220 lb (99.791 kg)  BMI 31.57 kg/m2  SpO2 100%  Physical Exam CONSTITUTIONAL: Well developed/well nourished HEAD AND FACE: Normocephalic/atraumatic EYES: EOMI ENMT: Mucous membranes moist LUNGS: no apparent distress ABDOMEN: soft NEURO: Pt is awake/alert, moves all extremitiesx4 SKIN: warm, color normal.  Abscess to left axilla, no erythema or drainage/crepitance PSYCH: no abnormalities of mood noted  ED Course  Procedures  PACKING REMOVAL - PROCEDURE  PACKING REMOVED FROM LEFT AXILLA PATIENT TOLERATED WELL NO ERYTHEMA/CREPITANCE/DRAINAGE   1. Abscess       MDM  Nursing notes including past medical history and social history reviewed and  considered in documentation Previous records reviewed and considered - RECENT ED VISITS REVIEWED         Joya Gaskins, MD 04/10/12 1046

## 2012-04-10 NOTE — ED Notes (Signed)
Packing removal from left axilla.  Still c/o pain to area.

## 2012-04-10 NOTE — ED Provider Notes (Signed)
Medical screening examination/treatment/procedure(s) were performed by non-physician practitioner and as supervising physician I was immediately available for consultation/collaboration.   Gwyneth Sprout, MD 04/10/12 [phone]

## 2012-05-08 NOTE — Progress Notes (Signed)
UR chart review completed.  

## 2014-02-02 ENCOUNTER — Emergency Department (HOSPITAL_COMMUNITY): Payer: Self-pay

## 2014-02-02 ENCOUNTER — Encounter (HOSPITAL_COMMUNITY): Payer: Self-pay | Admitting: Emergency Medicine

## 2014-02-02 ENCOUNTER — Emergency Department (HOSPITAL_COMMUNITY)
Admission: EM | Admit: 2014-02-02 | Discharge: 2014-02-02 | Disposition: A | Discharge status: Home / Self Care | Payer: Self-pay | Attending: Emergency Medicine | Admitting: Emergency Medicine

## 2014-02-02 DIAGNOSIS — I10 Essential (primary) hypertension: Secondary | ICD-10-CM | POA: Insufficient documentation

## 2014-02-02 DIAGNOSIS — Z8639 Personal history of other endocrine, nutritional and metabolic disease: Secondary | ICD-10-CM | POA: Insufficient documentation

## 2014-02-02 DIAGNOSIS — S8392XA Sprain of unspecified site of left knee, initial encounter: Secondary | ICD-10-CM

## 2014-02-02 DIAGNOSIS — Y929 Unspecified place or not applicable: Secondary | ICD-10-CM | POA: Insufficient documentation

## 2014-02-02 DIAGNOSIS — S99919A Unspecified injury of unspecified ankle, initial encounter: Secondary | ICD-10-CM

## 2014-02-02 DIAGNOSIS — I252 Old myocardial infarction: Secondary | ICD-10-CM | POA: Insufficient documentation

## 2014-02-02 DIAGNOSIS — S99929A Unspecified injury of unspecified foot, initial encounter: Secondary | ICD-10-CM

## 2014-02-02 DIAGNOSIS — Z862 Personal history of diseases of the blood and blood-forming organs and certain disorders involving the immune mechanism: Secondary | ICD-10-CM | POA: Insufficient documentation

## 2014-02-02 DIAGNOSIS — F17200 Nicotine dependence, unspecified, uncomplicated: Secondary | ICD-10-CM | POA: Insufficient documentation

## 2014-02-02 DIAGNOSIS — Y9389 Activity, other specified: Secondary | ICD-10-CM | POA: Insufficient documentation

## 2014-02-02 DIAGNOSIS — Z792 Long term (current) use of antibiotics: Secondary | ICD-10-CM | POA: Insufficient documentation

## 2014-02-02 DIAGNOSIS — IMO0002 Reserved for concepts with insufficient information to code with codable children: Secondary | ICD-10-CM | POA: Insufficient documentation

## 2014-02-02 DIAGNOSIS — Z7982 Long term (current) use of aspirin: Secondary | ICD-10-CM | POA: Insufficient documentation

## 2014-02-02 DIAGNOSIS — Z87828 Personal history of other (healed) physical injury and trauma: Secondary | ICD-10-CM | POA: Insufficient documentation

## 2014-02-02 DIAGNOSIS — X500XXA Overexertion from strenuous movement or load, initial encounter: Secondary | ICD-10-CM | POA: Insufficient documentation

## 2014-02-02 DIAGNOSIS — S8990XA Unspecified injury of unspecified lower leg, initial encounter: Secondary | ICD-10-CM | POA: Insufficient documentation

## 2014-02-02 DIAGNOSIS — Z8719 Personal history of other diseases of the digestive system: Secondary | ICD-10-CM | POA: Insufficient documentation

## 2014-02-02 MED ORDER — OXYCODONE-ACETAMINOPHEN 5-325 MG PO TABS: 1.0000 | ORAL_TABLET | ORAL | Status: DC | PRN

## 2014-02-02 MED ORDER — OXYCODONE-ACETAMINOPHEN 5-325 MG PO TABS
1.0000 | ORAL_TABLET | Freq: Once | ORAL | Status: AC
  Administered 2014-02-02: 1 via ORAL
  Filled 2014-02-02: qty 1

## 2014-02-02 NOTE — ED Notes (Signed)
Pt reports pain in left knee for the past 3 months.  Reports pain has progressively gotten worse.  Denies injury.

## 2014-02-02 NOTE — Discharge Instructions (Signed)
Joint Sprain °A sprain is a tear or stretch in the ligaments that hold a joint together. Severe sprains may need as long as 3-6 weeks of immobilization and/or exercises to heal completely. Sprained joints should be rested and protected. If not, they can become unstable and prone to re-injury. Proper treatment can reduce your pain, shorten the period of disability, and reduce the risk of repeated injuries. °TREATMENT  °· Rest and elevate the injured joint to reduce pain and swelling. °· Apply ice packs to the injury for 20-30 minutes every 2-3 hours for the next 2-3 days. °· Keep the injury wrapped in a compression bandage or splint as long as the joint is painful or as instructed by your caregiver. °· Do not use the injured joint until it is completely healed to prevent re-injury and chronic instability. Follow the instructions of your caregiver. °· Long-term sprain management may require exercises and/or treatment by a physical therapist. Taping or special braces may help stabilize the joint until it is completely better. °SEEK MEDICAL CARE IF:  °· You develop increased pain or swelling of the joint. °· You develop increasing redness and warmth of the joint. °· You develop a fever. °· It becomes stiff. °· Your hand or foot gets cold or numb. °Document Released: 07/20/2004 Document Revised: 09/04/2011 Document Reviewed: 06/29/2008 °[employer]® Patient Information ©2015 [employer]. This information is not intended to replace advice given to you by your health care provider. Make sure you discuss any questions you have with your health care provider. ° °

## 2014-02-02 NOTE — ED Provider Notes (Signed)
CSN: [id number]     Arrival date & time 02/02/14  4401 History  This chart was scribed for Joya Gaskins, MD by Leone Payor, ED Scribe. This patient was seen in room APA14/APA14 and the patient's care was started 8:49 AM.    Chief Complaint  Patient presents with  . Knee Pain    Patient is a 43 y.o. male presenting with knee pain. The history is provided by the patient. No language interpreter was used.  Knee Pain Location:  Knee Injury: no   Knee location:  L knee Chronicity:  Chronic Dislocation: no   Foreign body present:  No foreign bodies Relieved by:  Nothing Worsened by:  Activity Ineffective treatments:  Ice, elevation, rest and NSAIDs Associated symptoms: no muscle weakness, no numbness and no swelling   Risk factors: no frequent fractures and no known bone disorder     HPI Comments: Bradley Hays is a 43 y.o. male who presents to the Emergency Department complaining of 3 months of intermittent left knee pain that worsened 2-3 days ago. Patient states the left knee normally makes clicking and popping noises but he heard a loud pop 2-3 days ago. He denies direct injury or trauma to the affected area. He has applied ice and rested the left knee without relief. He has taken OTC pain medication with relief in the past but states it is not providing relief anymore. He states the pain is worse with ambulation. He denies prior injury or surgery to the affected knee. He denies numbness or weakness.    Past Medical History  Diagnosis Date  . Arteriosclerotic cardiovascular disease (ASCVD) 11/10/09    Rothbart-BMS to circumflex posterior acute MI in the setting of cocaine use, peak CK of 3590, MB of 511 and troponin of 48; posterolateral hypokinesis with EF of 50% and total obstruction of circumflex; [facility] in 09/2010 40 chest pain, d-dimer of 1.57 with negative CT; EF of 45% with negative stress echo  . Hypertension   . Hyperlipidemia     lipid profile in 2011:185,  169, 37, 114; 2012: Total cholesterol of 311 and triglycerides of 1730  . Tobacco abuse     40 pack years; 0.5 PPD  . Gastroesophageal reflux disease   . Cocaine abuse     remote, quit 2010  . 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 MI, old    Past Surgical History  Procedure Laterality Date  . Appendectomy  2009  . Knee arthroscopy    . Coronary stent placement  2011    bare metal  . Nissen fundoplication  1992   Family History  Problem Relation Age of Onset  . Heart disease    . Arthritis    . Lung disease    . Cancer    . Asthma    . Kidney disease     History  Substance Use Topics  . Smoking status: Current Every Day Smoker -- 0.50 packs/day for 26 years    Types: Cigarettes  . Smokeless tobacco: Never Used  . Alcohol Use: Yes     Comment: occasionally     Review of Systems  Musculoskeletal: Positive for arthralgias.  Neurological: Negative for weakness and numbness.      Allergies  Review of patient's allergies indicates no known allergies.  Home Medications   Prior to Admission medications   Medication Sig Start Date End Date Taking? Authorizing Provider  aspirin EC 81 MG tablet  Take 81 mg by mouth daily.    Historical Provider, MD  ibuprofen (ADVIL,MOTRIN) 200 MG tablet Take 800 mg by mouth every 6 (six) hours as needed. Chest pain.    Historical Provider, MD  nitroGLYCERIN (NITROSTAT) 0.4 MG SL tablet Place 0.4 mg under the tongue every 5 (five) minutes x 3 doses as needed. Chest pain.    Historical Provider, MD  sulfamethoxazole-trimethoprim (SEPTRA DS) 800-160 MG per tablet Take 1 tablet by mouth 2 (two) times daily. For 10 days 04/08/12   Tammy L. Triplett, PA-C   BP 173/90  Pulse 103  Temp(Src) 97.8 F (36.6 C) (Oral)  Resp 20  Ht 5\' 11"  (1.803 m)  Wt 225 lb (102.059 kg)  BMI 31.39 kg/m2  SpO2 100% Physical Exam  CONSTITUTIONAL: Well developed/well nourished HEAD: Normocephalic/atraumatic EYES: EOMI ENMT: Mucous membranes  moist NECK: supple no meningeal signs CV: S1/S2 noted, no murmurs/rubs/gallops noted LUNGS: Lungs are clear to auscultation bilaterally, no apparent distress ABDOMEN: soft, nontender, no rebound or guarding NEURO: Pt is awake/alert, moves all extremitiesx4 EXTREMITIES: pulses normal, full ROM, tenderness to left patella, no edema or erythema. No calf tenderness  SKIN: warm, color normal PSYCH: no abnormalities of mood noted  ED Course  Procedures   DIAGNOSTIC STUDIES: Oxygen Saturation is 100% on RA, normal by my interpretation.    COORDINATION OF CARE: 8:54 AM Discussed treatment plan with pt at bedside and pt agreed to plan.  Imaging Review Dg Knee Complete 4 Views Left  02/02/2014   CLINICAL DATA:  Pain  EXAM: LEFT KNEE - COMPLETE 4+ VIEW  COMPARISON:  None.  FINDINGS: Frontal, lateral, and bilateral oblique views were obtained. No fracture, dislocation, or effusion. There is slight narrowing medially. There is a minimal spur arising from the superior posterior patella. No erosive change.  IMPRESSION: Slight osteoarthritic change.  No fracture or effusion.   Electronically Signed   By: Bretta BangWilliam  Woodruff M.D.   On: 02/02/2014 09:09      MDM   Final diagnoses:  Sprain of left knee, initial encounter    Nursing notes including past medical history and social history reviewed and considered in documentation xrays reviewed and considered   I personally performed the services described in this documentation, which was scribed in my presence. The recorded information has been reviewed and is accurate.      Joya Gaskinsonald W Earland Reish, MD 02/02/14 1034

## 2014-04-02 ENCOUNTER — Emergency Department (HOSPITAL_COMMUNITY): Payer: Self-pay

## 2014-04-02 ENCOUNTER — Emergency Department (HOSPITAL_COMMUNITY): Payer: Self-pay | Admitting: Certified Registered Nurse Anesthetist

## 2014-04-02 ENCOUNTER — Encounter (HOSPITAL_COMMUNITY): Payer: Self-pay | Admitting: Certified Registered Nurse Anesthetist

## 2014-04-02 ENCOUNTER — Encounter (HOSPITAL_COMMUNITY)
Admission: EM | Disposition: A | Discharge status: Home / Self Care | Payer: Self-pay | Source: Home / Self Care | Attending: Orthopedic Surgery

## 2014-04-02 ENCOUNTER — Inpatient Hospital Stay (HOSPITAL_COMMUNITY)
Admission: EM | Admit: 2014-04-02 | Discharge: 2014-04-04 | DRG: 511 | Disposition: A | Discharge status: Home / Self Care | Payer: Self-pay | Attending: Orthopedic Surgery | Admitting: Orthopedic Surgery

## 2014-04-02 ENCOUNTER — Encounter (HOSPITAL_COMMUNITY): Payer: Self-pay | Admitting: Emergency Medicine

## 2014-04-02 DIAGNOSIS — K219 Gastro-esophageal reflux disease without esophagitis: Secondary | ICD-10-CM | POA: Diagnosis present

## 2014-04-02 DIAGNOSIS — S52501A Unspecified fracture of the lower end of right radius, initial encounter for closed fracture: Principal | ICD-10-CM | POA: Diagnosis present

## 2014-04-02 DIAGNOSIS — I10 Essential (primary) hypertension: Secondary | ICD-10-CM | POA: Diagnosis present

## 2014-04-02 DIAGNOSIS — M25511 Pain in right shoulder: Secondary | ICD-10-CM

## 2014-04-02 DIAGNOSIS — S52502A Unspecified fracture of the lower end of left radius, initial encounter for closed fracture: Secondary | ICD-10-CM | POA: Diagnosis present

## 2014-04-02 DIAGNOSIS — I2510 Atherosclerotic heart disease of native coronary artery without angina pectoris: Secondary | ICD-10-CM | POA: Diagnosis present

## 2014-04-02 DIAGNOSIS — S40012A Contusion of left shoulder, initial encounter: Secondary | ICD-10-CM | POA: Diagnosis present

## 2014-04-02 DIAGNOSIS — G5602 Carpal tunnel syndrome, left upper limb: Secondary | ICD-10-CM | POA: Diagnosis present

## 2014-04-02 DIAGNOSIS — T07XXXA Unspecified multiple injuries, initial encounter: Secondary | ICD-10-CM

## 2014-04-02 DIAGNOSIS — S6412XA Injury of median nerve at wrist and hand level of left arm, initial encounter: Secondary | ICD-10-CM | POA: Diagnosis present

## 2014-04-02 DIAGNOSIS — I255 Ischemic cardiomyopathy: Secondary | ICD-10-CM | POA: Diagnosis present

## 2014-04-02 DIAGNOSIS — Z720 Tobacco use: Secondary | ICD-10-CM | POA: Diagnosis present

## 2014-04-02 DIAGNOSIS — S40011A Contusion of right shoulder, initial encounter: Secondary | ICD-10-CM | POA: Diagnosis present

## 2014-04-02 DIAGNOSIS — S52601A Unspecified fracture of lower end of right ulna, initial encounter for closed fracture: Secondary | ICD-10-CM

## 2014-04-02 DIAGNOSIS — Z596 Low income: Secondary | ICD-10-CM

## 2014-04-02 DIAGNOSIS — F17210 Nicotine dependence, cigarettes, uncomplicated: Secondary | ICD-10-CM | POA: Diagnosis present

## 2014-04-02 DIAGNOSIS — M25539 Pain in unspecified wrist: Secondary | ICD-10-CM

## 2014-04-02 DIAGNOSIS — M25512 Pain in left shoulder: Secondary | ICD-10-CM

## 2014-04-02 DIAGNOSIS — I252 Old myocardial infarction: Secondary | ICD-10-CM

## 2014-04-02 DIAGNOSIS — Z7982 Long term (current) use of aspirin: Secondary | ICD-10-CM

## 2014-04-02 DIAGNOSIS — S61501A Unspecified open wound of right wrist, initial encounter: Secondary | ICD-10-CM | POA: Diagnosis present

## 2014-04-02 DIAGNOSIS — Z01810 Encounter for preprocedural cardiovascular examination: Secondary | ICD-10-CM

## 2014-04-02 DIAGNOSIS — E785 Hyperlipidemia, unspecified: Secondary | ICD-10-CM | POA: Diagnosis present

## 2014-04-02 DIAGNOSIS — G5601 Carpal tunnel syndrome, right upper limb: Secondary | ICD-10-CM | POA: Diagnosis present

## 2014-04-02 DIAGNOSIS — Z23 Encounter for immunization: Secondary | ICD-10-CM

## 2014-04-02 DIAGNOSIS — S52602A Unspecified fracture of lower end of left ulna, initial encounter for closed fracture: Secondary | ICD-10-CM | POA: Diagnosis present

## 2014-04-02 DIAGNOSIS — S52509A Unspecified fracture of the lower end of unspecified radius, initial encounter for closed fracture: Secondary | ICD-10-CM | POA: Diagnosis present

## 2014-04-02 DIAGNOSIS — W132XXA Fall from, out of or through roof, initial encounter: Secondary | ICD-10-CM | POA: Diagnosis present

## 2014-04-02 HISTORY — PX: ORIF WRIST FRACTURE: SHX2133

## 2014-04-02 LAB — PROTIME-INR
INR: 1.03 (ref 0.00–1.49)
PROTHROMBIN TIME: 13.5 s (ref 11.6–15.2)

## 2014-04-02 LAB — COMPREHENSIVE METABOLIC PANEL
ALBUMIN: 3.7 g/dL (ref 3.5–5.2)
ALT: 42 U/L (ref 0–53)
ANION GAP: 13 (ref 5–15)
AST: 30 U/L (ref 0–37)
Alkaline Phosphatase: 53 U/L (ref 39–117)
BUN: 12 mg/dL (ref 6–23)
CALCIUM: 8.2 mg/dL — AB (ref 8.4–10.5)
CHLORIDE: 101 meq/L (ref 96–112)
CO2: 22 mEq/L (ref 19–32)
CREATININE: 0.96 mg/dL (ref 0.50–1.35)
GFR calc Af Amer: >90 mL/min (ref >90)
GFR calc non Af Amer: >90 mL/min (ref >90)
Glucose, Bld: 96 mg/dL (ref 70–99)
Potassium: 3.8 mEq/L (ref 3.7–5.3)
Sodium: 136 mEq/L — ABNORMAL LOW (ref 137–147)
TOTAL PROTEIN: 6.9 g/dL (ref 6.0–8.3)
Total Bilirubin: 0.3 mg/dL (ref 0.3–1.2)

## 2014-04-02 LAB — CDS SEROLOGY

## 2014-04-02 LAB — CBC
HEMATOCRIT: 42 % (ref 39.0–52.0)
HEMOGLOBIN: 14.8 g/dL (ref 13.0–17.0)
MCH: 31.6 pg (ref 26.0–34.0)
MCHC: 35.2 g/dL (ref 30.0–36.0)
MCV: 89.7 fL (ref 78.0–100.0)
Platelets: 198 10*3/uL (ref 150–400)
RBC: 4.68 MIL/uL (ref 4.22–5.81)
RDW: 13.3 % (ref 11.5–15.5)
WBC: 8.8 10*3/uL (ref 4.0–10.5)

## 2014-04-02 LAB — ETHANOL: Alcohol, Ethyl (B): 12 mg/dL — ABNORMAL HIGH (ref 0–11)

## 2014-04-02 LAB — SAMPLE TO BLOOD BANK

## 2014-04-02 SURGERY — OPEN REDUCTION INTERNAL FIXATION (ORIF) WRIST FRACTURE
Anesthesia: General | Site: Wrist | Laterality: Bilateral

## 2014-04-02 MED ORDER — BUPIVACAINE HCL (PF) 0.25 % IJ SOLN
INTRAMUSCULAR | Status: DC | PRN
  Administered 2014-04-02: 20 mL

## 2014-04-02 MED ORDER — FENTANYL CITRATE 0.05 MG/ML IJ SOLN
INTRAMUSCULAR | Status: DC | PRN
  Administered 2014-04-02: 250 ug via INTRAVENOUS

## 2014-04-02 MED ORDER — ALBUTEROL SULFATE (2.5 MG/3ML) 0.083% IN NEBU
INHALATION_SOLUTION | RESPIRATORY_TRACT | Status: AC
  Administered 2014-04-02: 2.5 mg via RESPIRATORY_TRACT
  Filled 2014-04-02: qty 3

## 2014-04-02 MED ORDER — ALBUTEROL SULFATE (2.5 MG/3ML) 0.083% IN NEBU
2.5000 mg | INHALATION_SOLUTION | Freq: Four times a day (QID) | RESPIRATORY_TRACT | Status: DC | PRN
  Administered 2014-04-02: 2.5 mg via RESPIRATORY_TRACT

## 2014-04-02 MED ORDER — MIDAZOLAM HCL 2 MG/2ML IJ SOLN
INTRAMUSCULAR | Status: AC
Start: 2014-04-02 — End: 2014-04-02
  Filled 2014-04-02: qty 2

## 2014-04-02 MED ORDER — BUPIVACAINE HCL (PF) 0.25 % IJ SOLN
INTRAMUSCULAR | Status: AC
  Filled 2014-04-02: qty 30

## 2014-04-02 MED ORDER — MORPHINE SULFATE 10 MG/ML IJ SOLN: 10.0000 mg | INTRAMUSCULAR | Status: AC

## 2014-04-02 MED ORDER — SUCCINYLCHOLINE CHLORIDE 20 MG/ML IJ SOLN
INTRAMUSCULAR | Status: DC | PRN
  Administered 2014-04-02: 60 mg via INTRAVENOUS

## 2014-04-02 MED ORDER — MIDAZOLAM HCL 5 MG/5ML IJ SOLN
INTRAMUSCULAR | Status: DC | PRN
  Administered 2014-04-02: 2 mg via INTRAVENOUS

## 2014-04-02 MED ORDER — METHOCARBAMOL 1000 MG/10ML IJ SOLN: 500.0000 mg | Freq: Four times a day (QID) | INTRAVENOUS | Status: DC | PRN

## 2014-04-02 MED ORDER — HYDROMORPHONE HCL 1 MG/ML IJ SOLN
0.2500 mg | INTRAMUSCULAR | Status: AC | PRN
  Administered 2014-04-02 – 2014-04-03 (×8): 0.5 mg via INTRAVENOUS

## 2014-04-02 MED ORDER — 0.9 % SODIUM CHLORIDE (POUR BTL) OPTIME
TOPICAL | Status: DC | PRN
  Administered 2014-04-02: 2000 mL

## 2014-04-02 MED ORDER — ONDANSETRON HCL 4 MG/2ML IJ SOLN
INTRAMUSCULAR | Status: AC
  Filled 2014-04-02: qty 2

## 2014-04-02 MED ORDER — MORPHINE SULFATE 2 MG/ML IJ SOLN
INTRAMUSCULAR | Status: AC
  Administered 2014-04-02: 10 mg via INTRAVENOUS
  Filled 2014-04-02: qty 5

## 2014-04-02 MED ORDER — FENTANYL CITRATE 0.05 MG/ML IJ SOLN
INTRAMUSCULAR | Status: AC
  Filled 2014-04-02: qty 5

## 2014-04-02 MED ORDER — PROPOFOL 10 MG/ML IV BOLUS
INTRAVENOUS | Status: AC
  Filled 2014-04-02: qty 20

## 2014-04-02 MED ORDER — OXYCODONE HCL 5 MG PO TABS
5.0000 mg | ORAL_TABLET | ORAL | Status: DC | PRN
  Administered 2014-04-03 (×3): 10 mg via ORAL
  Filled 2014-04-02 (×3): qty 2

## 2014-04-02 MED ORDER — ARTIFICIAL TEARS OP OINT
TOPICAL_OINTMENT | OPHTHALMIC | Status: DC | PRN
  Administered 2014-04-02: 1 via OPHTHALMIC

## 2014-04-02 MED ORDER — IOHEXOL 300 MG/ML  SOLN
100.0000 mL | Freq: Once | INTRAMUSCULAR | Status: AC | PRN
  Administered 2014-04-02: 100 mL via INTRAVENOUS

## 2014-04-02 MED ORDER — LACTATED RINGERS IV SOLN
INTRAVENOUS | Status: DC | PRN
  Administered 2014-04-02 (×2): via INTRAVENOUS

## 2014-04-02 MED ORDER — CEFAZOLIN SODIUM-DEXTROSE 2-3 GM-% IV SOLR
INTRAVENOUS | Status: DC | PRN
  Administered 2014-04-02: 2 g via INTRAVENOUS

## 2014-04-02 MED ORDER — GLYCOPYRROLATE 0.2 MG/ML IJ SOLN
INTRAMUSCULAR | Status: AC
  Filled 2014-04-02: qty 3

## 2014-04-02 MED ORDER — ONDANSETRON HCL 4 MG/2ML IJ SOLN
INTRAMUSCULAR | Status: DC | PRN
  Administered 2014-04-02: 4 mg via INTRAVENOUS

## 2014-04-02 MED ORDER — HYDROMORPHONE HCL 1 MG/ML IJ SOLN
INTRAMUSCULAR | Status: AC
  Administered 2014-04-02
  Filled 2014-04-02: qty 1

## 2014-04-02 MED ORDER — NEOSTIGMINE METHYLSULFATE 10 MG/10ML IV SOLN
INTRAVENOUS | Status: DC | PRN
  Administered 2014-04-02: 4 mg via INTRAVENOUS

## 2014-04-02 MED ORDER — PROPOFOL 10 MG/ML IV BOLUS
INTRAVENOUS | Status: DC | PRN
  Administered 2014-04-02: 160 mg via INTRAVENOUS

## 2014-04-02 MED ORDER — METHOCARBAMOL 500 MG PO TABS
500.0000 mg | ORAL_TABLET | Freq: Four times a day (QID) | ORAL | Status: DC | PRN
  Administered 2014-04-02 – 2014-04-04 (×6): 500 mg via ORAL
  Filled 2014-04-02 (×6): qty 1

## 2014-04-02 MED ORDER — SUCCINYLCHOLINE CHLORIDE 20 MG/ML IJ SOLN
INTRAMUSCULAR | Status: AC
  Filled 2014-04-02: qty 1

## 2014-04-02 MED ORDER — GLYCOPYRROLATE 0.2 MG/ML IJ SOLN
INTRAMUSCULAR | Status: DC | PRN
  Administered 2014-04-02: 0.6 mg via INTRAVENOUS

## 2014-04-02 MED ORDER — OXYCODONE HCL 5 MG/5ML PO SOLN
5.0000 mg | Freq: Once | ORAL | Status: AC | PRN
Start: 2014-04-02 — End: 2014-04-02

## 2014-04-02 MED ORDER — ONDANSETRON HCL 4 MG/2ML IJ SOLN
4.0000 mg | INTRAMUSCULAR | Status: AC
  Administered 2014-04-02: 4 mg via INTRAVENOUS

## 2014-04-02 MED ORDER — MORPHINE SULFATE 10 MG/ML IJ SOLN
10.0000 mg | INTRAMUSCULAR | Status: AC
  Administered 2014-04-02: 10 mg via INTRAVENOUS
  Filled 2014-04-02: qty 1

## 2014-04-02 MED ORDER — LORAZEPAM 2 MG/ML IJ SOLN
1.0000 mg | Freq: Once | INTRAMUSCULAR | Status: AC
  Administered 2014-04-02: 1 mg via INTRAVENOUS
  Filled 2014-04-02: qty 1

## 2014-04-02 MED ORDER — LIDOCAINE HCL (CARDIAC) 20 MG/ML IV SOLN
INTRAVENOUS | Status: AC
  Filled 2014-04-02: qty 5

## 2014-04-02 MED ORDER — HYDROMORPHONE HCL 1 MG/ML IJ SOLN
INTRAMUSCULAR | Status: AC
  Administered 2014-04-02: 0.5 mg via INTRAVENOUS
  Filled 2014-04-02: qty 2

## 2014-04-02 MED ORDER — METHOCARBAMOL 500 MG PO TABS
ORAL_TABLET | ORAL | Status: AC
  Administered 2014-04-02: 500 mg via ORAL
  Filled 2014-04-02: qty 1

## 2014-04-02 MED ORDER — OXYCODONE HCL 5 MG PO TABS
5.0000 mg | ORAL_TABLET | Freq: Once | ORAL | Status: AC | PRN
  Administered 2014-04-02: 10 mg via ORAL

## 2014-04-02 MED ORDER — LIDOCAINE HCL (CARDIAC) 20 MG/ML IV SOLN
INTRAVENOUS | Status: DC | PRN
  Administered 2014-04-02: 80 mg via INTRAVENOUS

## 2014-04-02 MED ORDER — VECURONIUM BROMIDE 10 MG IV SOLR
INTRAVENOUS | Status: DC | PRN
  Administered 2014-04-02: 5 mg via INTRAVENOUS
  Administered 2014-04-02: 3 mg via INTRAVENOUS
  Administered 2014-04-02: 2 mg via INTRAVENOUS

## 2014-04-02 MED ORDER — SUCCINYLCHOLINE CHLORIDE 20 MG/ML IJ SOLN: INTRAMUSCULAR | Status: DC | PRN

## 2014-04-02 MED ORDER — OXYCODONE HCL 5 MG PO TABS
ORAL_TABLET | ORAL | Status: AC
  Administered 2014-04-02: 10 mg via ORAL
  Filled 2014-04-02: qty 2

## 2014-04-02 MED ORDER — HYDROMORPHONE HCL 1 MG/ML IJ SOLN
INTRAMUSCULAR | Status: AC
  Administered 2014-04-03
  Filled 2014-04-02: qty 1

## 2014-04-02 MED ORDER — METOPROLOL TARTRATE 25 MG PO TABS
25.0000 mg | ORAL_TABLET | Freq: Two times a day (BID) | ORAL | Status: DC
  Administered 2014-04-02 – 2014-04-04 (×4): 25 mg via ORAL
  Filled 2014-04-02 (×6): qty 1

## 2014-04-02 MED ORDER — PHENYLEPHRINE HCL 10 MG/ML IJ SOLN
INTRAMUSCULAR | Status: DC | PRN
  Administered 2014-04-02: 80 ug via INTRAVENOUS

## 2014-04-02 SURGICAL SUPPLY — 65 items
BANDAGE ELASTIC 3 VELCRO ST LF (GAUZE/BANDAGES/DRESSINGS) ×5 IMPLANT
BANDAGE ELASTIC 4 VELCRO ST LF (GAUZE/BANDAGES/DRESSINGS) ×5 IMPLANT
BIT DRILL 2.2 SS TIBIAL (BIT) ×2 IMPLANT
BLADE SURG ROTATE 9660 (MISCELLANEOUS) IMPLANT
BNDG CMPR 9X4 STRL LF SNTH (GAUZE/BANDAGES/DRESSINGS) ×2
BNDG ESMARK 4X9 LF (GAUZE/BANDAGES/DRESSINGS) ×5 IMPLANT
BNDG GAUZE ELAST 4 BULKY (GAUZE/BANDAGES/DRESSINGS) ×7 IMPLANT
CORDS BIPOLAR (ELECTRODE) ×5 IMPLANT
COVER SURGICAL LIGHT HANDLE (MISCELLANEOUS) ×3 IMPLANT
CUFF TOURNIQUET SINGLE 18IN (TOURNIQUET CUFF) ×5 IMPLANT
CUFF TOURNIQUET SINGLE 24IN (TOURNIQUET CUFF) IMPLANT
DRAIN TLS ROUND 10FR (DRAIN) IMPLANT
DRAPE OEC MINIVIEW 54X84 (DRAPES) IMPLANT
DRAPE SURG 17X23 STRL (DRAPES) ×5 IMPLANT
GAUZE SPONGE 4X4 12PLY STRL (GAUZE/BANDAGES/DRESSINGS) ×5 IMPLANT
GAUZE XEROFORM 1X8 LF (GAUZE/BANDAGES/DRESSINGS) ×1 IMPLANT
GAUZE XEROFORM 5X9 LF (GAUZE/BANDAGES/DRESSINGS) ×4 IMPLANT
GLOVE BIOGEL M STRL SZ7.5 (GLOVE) ×5 IMPLANT
GLOVE SS BIOGEL STRL SZ 8 (GLOVE) ×1 IMPLANT
GLOVE SUPERSENSE BIOGEL SZ 8 (GLOVE) ×4
GOWN STRL REUS W/ TWL LRG LVL3 (GOWN DISPOSABLE) ×3 IMPLANT
GOWN STRL REUS W/ TWL XL LVL3 (GOWN DISPOSABLE) ×3 IMPLANT
GOWN STRL REUS W/TWL LRG LVL3 (GOWN DISPOSABLE) ×9
GOWN STRL REUS W/TWL XL LVL3 (GOWN DISPOSABLE) ×9
KIT BASIN OR (CUSTOM PROCEDURE TRAY) ×3 IMPLANT
KIT ROOM TURNOVER OR (KITS) ×3 IMPLANT
LOOP VESSEL MAXI BLUE (MISCELLANEOUS) IMPLANT
MANIFOLD NEPTUNE II (INSTRUMENTS) ×3 IMPLANT
NEEDLE 22X1 1/2 (OR ONLY) (NEEDLE) ×2 IMPLANT
NS IRRIG 1000ML POUR BTL (IV SOLUTION) ×5 IMPLANT
PACK ORTHO EXTREMITY (CUSTOM PROCEDURE TRAY) ×3 IMPLANT
PAD ARMBOARD 7.5X6 YLW CONV (MISCELLANEOUS) ×6 IMPLANT
PAD CAST 3X4 CTTN HI CHSV (CAST SUPPLIES) ×1 IMPLANT
PAD CAST 4YDX4 CTTN HI CHSV (CAST SUPPLIES) ×1 IMPLANT
PADDING CAST COTTON 3X4 STRL (CAST SUPPLIES) ×6
PADDING CAST COTTON 4X4 STRL (CAST SUPPLIES) ×6
PEG LOCKING SMOOTH 2.2X18 (Peg) ×2 IMPLANT
PEG LOCKING SMOOTH 2.2X20 (Screw) ×10 IMPLANT
PEG LOCKING SMOOTH 2.2X22 (Screw) ×6 IMPLANT
PLATE DVR CROSSLOCK STD RT (Plate) ×2 IMPLANT
PLATE STANDARD DVR LEFT (Plate) ×3 IMPLANT
PLATE STD DVR LT 24X51 (Plate) IMPLANT
SCREW LOCK 14X2.7X 3 LD TPR (Screw) IMPLANT
SCREW LOCK 16X2.7X 3 LD TPR (Screw) IMPLANT
SCREW LOCK 18X2.7X 3 LD TPR (Screw) IMPLANT
SCREW LOCK 20X2.7X 3 LD TPR (Screw) IMPLANT
SCREW LOCKING 2.7X14 (Screw) ×3 IMPLANT
SCREW LOCKING 2.7X15MM (Screw) ×4 IMPLANT
SCREW LOCKING 2.7X16 (Screw) ×21 IMPLANT
SCREW LOCKING 2.7X18 (Screw) ×6 IMPLANT
SCREW LOCKING 2.7X20MM (Screw) ×3 IMPLANT
SPLINT FIBERGLASS 4X30 (CAST SUPPLIES) ×4 IMPLANT
SPONGE LAP 4X18 X RAY DECT (DISPOSABLE) IMPLANT
SUT MNCRL AB 4-0 PS2 18 (SUTURE) ×3 IMPLANT
SUT PROLENE 3 0 PS 2 (SUTURE) ×4 IMPLANT
SUT PROLENE 4 0 PS 2 18 (SUTURE) ×16 IMPLANT
SUT VIC AB 3-0 FS2 27 (SUTURE) ×4 IMPLANT
SYR CONTROL 10ML LL (SYRINGE) ×2 IMPLANT
SYSTEM CHEST DRAIN TLS 7FR (DRAIN) ×4 IMPLANT
TOWEL OR 17X24 6PK STRL BLUE (TOWEL DISPOSABLE) ×3 IMPLANT
TOWEL OR 17X26 10 PK STRL BLUE (TOWEL DISPOSABLE) ×5 IMPLANT
TUBE CONNECTING 12'X1/4 (SUCTIONS) ×2
TUBE CONNECTING 12X1/4 (SUCTIONS) ×3 IMPLANT
TUBE EVACUATION TLS (MISCELLANEOUS) ×5 IMPLANT
WATER STERILE IRR 1000ML POUR (IV SOLUTION) ×3 IMPLANT

## 2014-04-02 NOTE — Anesthesia Preprocedure Evaluation (Addendum)
Anesthesia Evaluation  Patient identified by MRN, date of birth, ID band Patient awake    Reviewed: Allergy & Precautions, H&P , NPO status , Patient's Chart, lab work & pertinent test results  History of Anesthesia Complications Negative for: history of anesthetic complications  Airway Mallampati: III TM Distance: >3 FB Neck ROM: Full    Dental  (+) Poor Dentition, Loose, Missing,    Pulmonary neg shortness of breath, neg sleep apnea, neg COPDneg recent URI, Current Smoker,  breath sounds clear to auscultation        Cardiovascular hypertension, + Past MI, + Cardiac Stents and +CHF Rhythm:Regular  Posterior lateral mi in 2013 (cocaine use) with placement of BM stent, little follow up due to insurance issues, seen by Dr Rennis GoldenHilty (cardiology) and cleared for surgery without further workup, preop Beta block given by cardiology    Neuro/Psych negative neurological ROS     GI/Hepatic negative GI ROS, Neg liver ROS, GERD-  Controlled,S/p nissen for hiatal hernia per patient   Endo/Other  Morbid obesity  Renal/GU negative Renal ROS     Musculoskeletal 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   Abdominal   Peds  Hematology   Anesthesia Other Findings   Reproductive/Obstetrics                          Anesthesia Physical Anesthesia Plan  ASA: III  Anesthesia Plan: General   Post-op Pain Management:    Induction: Intravenous  Airway Management Planned: Oral ETT  Additional Equipment: None  Intra-op Plan:   Post-operative Plan: Extubation in OR  Informed Consent: I have reviewed the patients History and Physical, chart, labs and discussed the procedure including the risks, benefits and alternatives for the proposed anesthesia with the patient or authorized representative who has indicated his/her understanding and acceptance.   Dental advisory given  Plan Discussed with: CRNA  and Surgeon  Anesthesia Plan Comments:         Anesthesia Quick Evaluation

## 2014-04-02 NOTE — Op Note (Signed)
See dictation #[id number] Amanda PeaGramig Md

## 2014-04-02 NOTE — ED Notes (Signed)
Dr. Rennis GoldenHilty, Cardiologist, at bedside.

## 2014-04-02 NOTE — ED Notes (Signed)
Per [facility], pt was on roof redoing shingles, fell and caught the ladder and rode it down. Has deformities to bilateral wrists, abrasion to left shoulder. Landed on grass. Can't move his fingers, and has tingling to his fingers.

## 2014-04-02 NOTE — Consult Note (Signed)
CONSULTATION NOTE  Reason for Consult: Pre-operative risk assessment  Requesting Physician: Dr. Amedeo Plenty    Cardiologist: None (previously [place])  HPI: This is a 43 y.o. male with a past medical history significant for coronary artery disease s/p BMS to the LCX for acute posterior MI in the setting of cocaine abuse in 09/2010 [facility]), ischemic cardiomyopathy, EF 45% at the time, improved to 50-55% by echo in 08/2011 (basal to mid posterolateral scar noted). He also has HTN, dyslipidemia, and tobacco abuse. According to the patient he was working on his roof shingle in today when he fell off of the ladder. He apparently rode down the ladder and later on his arms and has developed bilateral hand and arm injuries. He was conscious during the event and denies any chest pain. As mentioned he was last seen in followup by Dr. Lattie Haw in 2013 and has not been seen since then. Dr. Lattie Haw has retired from the clinical practice of cardiology. He reports no recent cocaine abuse. He denies any worsening chest pain or increasing shortness of breath. He has been climbing up and down ladders for several days without any difficulty. He has not been on an anticoagulation. Initial laboratory work is unremarkable.   PMHx:  Past Medical History  Diagnosis Date  . Arteriosclerotic cardiovascular disease (ASCVD) 11/10/09    Rothbart-BMS to circumflex posterior acute MI in the setting of cocaine use, peak CK of 3590, MB of 511 and troponin of 48; posterolateral hypokinesis with EF of 50% and total obstruction of circumflex; [facility] in 09/2010 40 chest pain, d-dimer of 1.57 with negative CT; EF of 45% with negative stress echo  . Hypertension   . Hyperlipidemia     lipid profile in 2011:185, 169, 37, 114; 2012: Total cholesterol of 311 and triglycerides of 1730  . Tobacco abuse     40 pack years; 0.5 PPD  . Gastroesophageal reflux disease   . Cocaine abuse     remote, quit  2010  . 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 MI, old    Past Surgical History  Procedure Laterality Date  . Appendectomy  2009  . Knee arthroscopy    . Coronary stent placement  2011    bare metal  . Nissen fundoplication  5053    FAMHx: Family History  Problem Relation Age of Onset  . Heart disease    . Arthritis    . Lung disease    . Cancer    . Asthma    . Kidney disease      SOCHx:  reports that he has been smoking Cigarettes.  He has a 13 pack-year smoking history. He has never used smokeless tobacco. He reports that he drinks alcohol. He reports that he does not use illicit drugs.  ALLERGIES: No Known Allergies  ROS: A comprehensive review of systems was negative except for: Constitutional: positive for pain Musculoskeletal: positive for BILATERAL ARM/WRIST PAIN  HOME MEDICATIONS:   Medication List    ASK your doctor about these medications       aspirin EC 81 MG tablet  Take 81 mg by mouth daily.     GOODY HEADACHE PO  Take 2 Packages by mouth daily as needed.     nitroGLYCERIN 0.4 MG SL tablet  Commonly known as:  NITROSTAT  Place 0.4 mg under the tongue every 5 (five) minutes x 3 doses as needed. Chest pain.  HOSPITAL MEDICATIONS: None  VITALS: Blood pressure 156/94, pulse 88, temperature 97.6 F (36.4 C), temperature source Oral, resp. rate 14, SpO2 99.00%.  PHYSICAL EXAM: General appearance: alert and appears in pain Neck: no carotid bruit and no JVD Lungs: clear to auscultation bilaterally Heart: regular rate and rhythm, S1, S2 normal, no murmur, click, rub or gallop Abdomen: soft, non-tender; bowel sounds normal; no masses,  no organomegaly Extremities: marked bilateral angulated deformity of the wrists from trauma Pulses: reduced radial pulses Skin: Skin color, texture, turgor normal. No rashes or lesions Neurologic: Mental status: Alert, oriented, thought content appropriate Psych: Pleasant,  respectful  LABS: Results for orders placed during the hospital encounter of 04/02/14 (from the past 48 hour(s))  CDS SEROLOGY     Status: None   Collection Time    04/02/14  4:22 PM      Result Value Ref Range   CDS serology specimen       Value: SPECIMEN WILL BE HELD FOR 14 DAYS IF TESTING IS REQUIRED  COMPREHENSIVE METABOLIC PANEL     Status: Abnormal   Collection Time    04/02/14  4:22 PM      Result Value Ref Range   Sodium 136 (*) 137 - 147 mEq/L   Potassium 3.8  3.7 - 5.3 mEq/L   Chloride 101  96 - 112 mEq/L   CO2 22  19 - 32 mEq/L   Glucose, Bld 96  70 - 99 mg/dL   BUN 12  6 - 23 mg/dL   Creatinine, Ser 0.96  0.50 - 1.35 mg/dL   Calcium 8.2 (*) 8.4 - 10.5 mg/dL   Total Protein 6.9  6.0 - 8.3 g/dL   Albumin 3.7  3.5 - 5.2 g/dL   AST 30  0 - 37 U/L   ALT 42  0 - 53 U/L   Alkaline Phosphatase 53  39 - 117 U/L   Total Bilirubin 0.3  0.3 - 1.2 mg/dL   GFR calc non Af Amer >90  >90 mL/min   GFR calc Af Amer >90  >90 mL/min   Comment: (NOTE)     The eGFR has been calculated using the CKD EPI equation.     This calculation has not been validated in all clinical situations.     eGFR's persistently <90 mL/min signify possible Chronic Kidney     Disease.   Anion gap 13  5 - 15  CBC     Status: None   Collection Time    04/02/14  4:22 PM      Result Value Ref Range   WBC 8.8  4.0 - 10.5 K/uL   RBC 4.68  4.22 - 5.81 MIL/uL   Hemoglobin 14.8  13.0 - 17.0 g/dL   HCT 42.0  39.0 - 52.0 %   MCV 89.7  78.0 - 100.0 fL   MCH 31.6  26.0 - 34.0 pg   MCHC 35.2  30.0 - 36.0 g/dL   RDW 13.3  11.5 - 15.5 %   Platelets 198  150 - 400 K/uL  ETHANOL     Status: Abnormal   Collection Time    04/02/14  4:22 PM      Result Value Ref Range   Alcohol, Ethyl (B) 12 (*) 0 - 11 mg/dL   Comment:            LOWEST DETECTABLE LIMIT FOR     SERUM ALCOHOL IS 11 mg/dL     FOR MEDICAL PURPOSES ONLY  PROTIME-INR  Status: None   Collection Time    04/02/14  4:22 PM      Result Value Ref  Range   Prothrombin Time 13.5  11.6 - 15.2 seconds   INR 1.03  0.00 - 1.49  SAMPLE TO BLOOD BANK     Status: None   Collection Time    04/02/14  4:25 PM      Result Value Ref Range   Blood Bank Specimen SAMPLE AVAILABLE FOR TESTING     Sample Expiration 04/03/2014      IMAGING: Dg Wrist 2 Views Left  04/02/2014   CLINICAL DATA:  Initial evaluation for left wrist pain after fall today, on a roof working on shingles, fell off ladder, wrist deformity  EXAM: LEFT WRIST - 2 VIEW  COMPARISON:  None.  FINDINGS: There is a moderately displaced fracture of the ulnar styloid process. There is a comminuted fracture through the distal shaft and metaphysis of the radius. Distal radial metaphysis overlaps the distal shaft of the radius. There is significant apex volar angulation.  IMPRESSION: Fractures of the distal radius and ulna   Electronically Signed   By: Skipper Cliche M.D.   On: 04/02/2014 16:00   Dg Wrist 2 Views Right  04/02/2014   CLINICAL DATA:  Fall off roof.  Initial evaluation .  EXAM: RIGHT WRIST - 2 VIEW  COMPARISON:  None.  FINDINGS: Comminuted distal radial fracture is present with extension of the fracture into the radiocarpal joint space. Fracture is displaced and angulated posteriorly. Displaced ulnar styloid fracture is present. No other focal abnormality.  IMPRESSION: 1. Comminuted distal radial fracture is present with extension of fracture fragments into the radiocarpal space. Fracture is displaced and angulated posteriorly.  2.  Displaced ulnar styloid fracture.   Electronically Signed   By: Marcello Moores  Register   On: 04/02/2014 15:59   Ct Chest W Contrast  04/02/2014   CLINICAL DATA:  Fall from roof with known bilateral wrist fractures. Bilateral shoulder and neck pain.  EXAM: CT CHEST, ABDOMEN, AND PELVIS WITH CONTRAST  TECHNIQUE: Multidetector CT imaging of the chest, abdomen and pelvis was performed following the standard protocol during bolus administration of intravenous contrast.   CONTRAST:  163m OMNIPAQUE IOHEXOL 300 MG/ML  SOLN  COMPARISON:  Multiple exams, including 06/28/2013 and 07/30/2007  FINDINGS: CT CHEST FINDINGS  Coronary atherosclerosis involving the left anterior descending and circumflex coronary arteries. No mediastinal hematoma or irregularity of the thoracic aorta or branch vessels identified.  No pathologic thoracic adenopathy. No pneumothorax or pulmonary contusion. The lungs appear clear. No pleural effusion.  Suspected small type 2 hiatal hernia.  CT ABDOMEN AND PELVIS FINDINGS  Hepatobiliary: Diffuse hepatic steatosis. Mild sparing along the gallbladder fossa.  Pancreas: Intact  Spleen: Intact  Adrenals/Urinary Tract: Unremarkable  Stomach/Bowel: Small type 2 hiatal hernia.  Vascular/Lymphatic: Minimal aortoiliac atherosclerosis.  Reproductive: Unremarkable  Other: No supplemental non-categorized findings.  Musculoskeletal: Lower lumbar degenerative facet arthropathy.  IMPRESSION: 1. No significant acute posttraumatic findings in the chest or abdomen. 2. Various ancillary findings include coronary atherosclerosis; a small type 2 hiatal hernia; diffuse hepatic steatosis ; and lower lumbar degenerative facet arthropathy.   Electronically Signed   By: WSherryl BartersM.D.   On: 04/02/2014 16:30   Ct Cervical Spine Wo Contrast  04/02/2014   CLINICAL DATA:  Acute neck pain after fall off roof.  EXAM: CT CERVICAL SPINE WITHOUT CONTRAST  TECHNIQUE: Multidetector CT imaging of the cervical spine was performed without intravenous contrast. Multiplanar CT  image reconstructions were also generated.  COMPARISON:  October 18, 2013.  FINDINGS: No fracture or spondylolisthesis is noted. Mild degenerative disc disease is noted at C6-7 with anterior and posterior osteophyte formation. Mild degenerative disc disease is also noted at C3-4 with posterior osteophyte formation. Posterior facet joints appear normal.  IMPRESSION: Degenerative changes as described above. No fracture or  spondylolisthesis is seen in the cervical spine.   Electronically Signed   By: Sabino Dick M.D.   On: 04/02/2014 16:24   Ct Abdomen Pelvis W Contrast  04/02/2014   CLINICAL DATA:  Fall from roof with known bilateral wrist fractures. Bilateral shoulder and neck pain.  EXAM: CT CHEST, ABDOMEN, AND PELVIS WITH CONTRAST  TECHNIQUE: Multidetector CT imaging of the chest, abdomen and pelvis was performed following the standard protocol during bolus administration of intravenous contrast.  CONTRAST:  175m OMNIPAQUE IOHEXOL 300 MG/ML  SOLN  COMPARISON:  Multiple exams, including 06/28/2013 and 07/30/2007  FINDINGS: CT CHEST FINDINGS  Coronary atherosclerosis involving the left anterior descending and circumflex coronary arteries. No mediastinal hematoma or irregularity of the thoracic aorta or branch vessels identified.  No pathologic thoracic adenopathy. No pneumothorax or pulmonary contusion. The lungs appear clear. No pleural effusion.  Suspected small type 2 hiatal hernia.  CT ABDOMEN AND PELVIS FINDINGS  Hepatobiliary: Diffuse hepatic steatosis. Mild sparing along the gallbladder fossa.  Pancreas: Intact  Spleen: Intact  Adrenals/Urinary Tract: Unremarkable  Stomach/Bowel: Small type 2 hiatal hernia.  Vascular/Lymphatic: Minimal aortoiliac atherosclerosis.  Reproductive: Unremarkable  Other: No supplemental non-categorized findings.  Musculoskeletal: Lower lumbar degenerative facet arthropathy.  IMPRESSION: 1. No significant acute posttraumatic findings in the chest or abdomen. 2. Various ancillary findings include coronary atherosclerosis; a small type 2 hiatal hernia; diffuse hepatic steatosis ; and lower lumbar degenerative facet arthropathy.   Electronically Signed   By: WSherryl BartersM.D.   On: 04/02/2014 16:30   Dg Pelvis Portable  04/02/2014   CLINICAL DATA:  Acute pelvic pain after fall from roof.  EXAM: PORTABLE PELVIS 1-2 VIEWS  COMPARISON:  None.  FINDINGS: There is no evidence of pelvic fracture  or diastasis. No pelvic bone lesions are seen.  IMPRESSION: Normal pelvis.   Electronically Signed   By: JSabino DickM.D.   On: 04/02/2014 16:00   Dg Chest Portable 1 View  04/02/2014   CLINICAL DATA:  [place].  Pain.  Initial evaluation .  EXAM: PORTABLE CHEST - 1 VIEW  COMPARISON:  03/06/2012.  FINDINGS: Mediastinum and hilar structures normal. Mild cardiomegaly, normal pulmonary vascularity. No pleural effusion or pneumothorax. No acute bony abnormalities identified.  IMPRESSION: No active disease.   Electronically Signed   By: TMarcello Moores Register   On: 04/02/2014 15:57    HOSPITAL DIAGNOSES: Principal Problem:   Closed fracture of distal ends of radius and ulna, bilateral Active Problems:   Arteriosclerotic cardiovascular disease (ASCVD)   Hypertension   Hyperlipidemia   Tobacco abuse   Preoperative cardiovascular examination   IMPRESSION: 1. Low risk for general anesthesia - non-elective procedure  RECOMMENDATION: 1. I would recommend Bradley Hays to the operating room to have his distal radial fractures repaired. He has not described any recent angina has been able to do strenuous physical activity without any difficulty. His last echo in 2013 showed an improved EF to 50-55%. He is not currently on any beta-blockade I would recommend starting metoprolol tartrate 25 mg twice daily for beta blocker preoperatively and continued postoperatively. He should ultimately go back on  low-dose aspirin based on his coronary disease history. At some point he should have a repeat lipid profile and be treated for that appropriately. He will need followup in our [place] office.  We will follow with you. Thanks for consulting [place].  Time Spent Directly with Patient: 45 minutes  Pixie Casino, MD, [facility] Attending Cardiologist [facility]  Garrett Bowring C 04/02/2014, 7:11 PM

## 2014-04-02 NOTE — ED Notes (Signed)
Dr. Jeraldine LootsLockwood at bedside ultrasounding patient's abdomen.

## 2014-04-02 NOTE — ED Notes (Signed)
Patient transported to CT 

## 2014-04-02 NOTE — H&P (Signed)
Bradley Hays is an 43 y.o. male.   Chief Complaint: Bilateral wrist pain with deformity. Shoulder pain. Status post fall off of a roof. HPI: 43 year old male who presents for my evaluation. He has bilateral distal radius fractures with acute carpal tunnel syndrome after a fall off of a roof. He sustained fractures to the wrist. States he has contusive injury to the elbows. He also complains of shoulder pain.  He denies neck back pain this juncture. He states his hips hurts a little bit. His pelvis x-ray is negative.  He is been seen by emergency medicine. CT of the C-spine chest and abdomen had been performed.  He is alert and oriented and is mentating well  He denies prior fracture history.  He does have an extensive cardiac history and I personally ask cardiology to see him.  The patient notes no chest pain at present time. He states that he has been able to climb ladders and do physical activity without chest pain.  Past Medical History  Diagnosis Date  . Arteriosclerotic cardiovascular disease (ASCVD) 11/10/09    Rothbart-BMS to circumflex posterior acute MI in the setting of cocaine use, peak CK of 3590, MB of 511 and troponin of 48; posterolateral hypokinesis with EF of 50% and total obstruction of circumflex; [facility] in 09/2010 40 chest pain, d-dimer of 1.57 with negative CT; EF of 45% with negative stress echo  . Hypertension   . Hyperlipidemia     lipid profile in 2011:185, 169, 37, 114; 2012: Total cholesterol of 311 and triglycerides of 1730  . Tobacco abuse     40 pack years; 0.5 PPD  . Gastroesophageal reflux disease   . Cocaine abuse     remote, quit 2010  . 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 MI, old     Past Surgical History  Procedure Laterality Date  . Appendectomy  2009  . Knee arthroscopy    . Coronary stent placement  2011    bare metal  . Nissen fundoplication  1610    Family History  Problem Relation Age of Onset  . Heart  disease    . Arthritis    . Lung disease    . Cancer    . Asthma    . Kidney disease     Social History:  reports that he has been smoking Cigarettes.  He has a 13 pack-year smoking history. He has never used smokeless tobacco. He reports that he drinks alcohol. He reports that he does not use illicit drugs.  Allergies: No Known Allergies   (Not in a hospital admission)  Results for orders placed during the hospital encounter of 04/02/14 (from the past 48 hour(s))  CDS SEROLOGY     Status: None   Collection Time    04/02/14  4:22 PM      Result Value Ref Range   CDS serology specimen       Value: SPECIMEN WILL BE HELD FOR 14 DAYS IF TESTING IS REQUIRED  COMPREHENSIVE METABOLIC PANEL     Status: Abnormal   Collection Time    04/02/14  4:22 PM      Result Value Ref Range   Sodium 136 (*) 137 - 147 mEq/L   Potassium 3.8  3.7 - 5.3 mEq/L   Chloride 101  96 - 112 mEq/L   CO2 22  19 - 32 mEq/L   Glucose, Bld 96  70 - 99 mg/dL  BUN 12  6 - 23 mg/dL   Creatinine, Ser 0.96  0.50 - 1.35 mg/dL   Calcium 8.2 (*) 8.4 - 10.5 mg/dL   Total Protein 6.9  6.0 - 8.3 g/dL   Albumin 3.7  3.5 - 5.2 g/dL   AST 30  0 - 37 U/L   ALT 42  0 - 53 U/L   Alkaline Phosphatase 53  39 - 117 U/L   Total Bilirubin 0.3  0.3 - 1.2 mg/dL   GFR calc non Af Amer >90  >90 mL/min   GFR calc Af Amer >90  >90 mL/min   Comment: (NOTE)     The eGFR has been calculated using the CKD EPI equation.     This calculation has not been validated in all clinical situations.     eGFR's persistently <90 mL/min signify possible Chronic Kidney     Disease.   Anion gap 13  5 - 15  CBC     Status: None   Collection Time    04/02/14  4:22 PM      Result Value Ref Range   WBC 8.8  4.0 - 10.5 K/uL   RBC 4.68  4.22 - 5.81 MIL/uL   Hemoglobin 14.8  13.0 - 17.0 g/dL   HCT 42.0  39.0 - 52.0 %   MCV 89.7  78.0 - 100.0 fL   MCH 31.6  26.0 - 34.0 pg   MCHC 35.2  30.0 - 36.0 g/dL   RDW 13.3  11.5 - 15.5 %   Platelets 198  150  - 400 K/uL  ETHANOL     Status: Abnormal   Collection Time    04/02/14  4:22 PM      Result Value Ref Range   Alcohol, Ethyl (B) 12 (*) 0 - 11 mg/dL   Comment:            LOWEST DETECTABLE LIMIT FOR     SERUM ALCOHOL IS 11 mg/dL     FOR MEDICAL PURPOSES ONLY  PROTIME-INR     Status: None   Collection Time    04/02/14  4:22 PM      Result Value Ref Range   Prothrombin Time 13.5  11.6 - 15.2 seconds   INR 1.03  0.00 - 1.49  SAMPLE TO BLOOD BANK     Status: None   Collection Time    04/02/14  4:25 PM      Result Value Ref Range   Blood Bank Specimen SAMPLE AVAILABLE FOR TESTING     Sample Expiration 04/03/2014     Dg Wrist 2 Views Left  04/02/2014   CLINICAL DATA:  Initial evaluation for left wrist pain after fall today, on a roof working on shingles, fell off ladder, wrist deformity  EXAM: LEFT WRIST - 2 VIEW  COMPARISON:  None.  FINDINGS: There is a moderately displaced fracture of the ulnar styloid process. There is a comminuted fracture through the distal shaft and metaphysis of the radius. Distal radial metaphysis overlaps the distal shaft of the radius. There is significant apex volar angulation.  IMPRESSION: Fractures of the distal radius and ulna   Electronically Signed   By: Skipper Cliche M.D.   On: 04/02/2014 16:00   Dg Wrist 2 Views Right  04/02/2014   CLINICAL DATA:  Fall off roof.  Initial evaluation .  EXAM: RIGHT WRIST - 2 VIEW  COMPARISON:  None.  FINDINGS: Comminuted distal radial fracture is present with extension of the fracture into  the radiocarpal joint space. Fracture is displaced and angulated posteriorly. Displaced ulnar styloid fracture is present. No other focal abnormality.  IMPRESSION: 1. Comminuted distal radial fracture is present with extension of fracture fragments into the radiocarpal space. Fracture is displaced and angulated posteriorly.  2.  Displaced ulnar styloid fracture.   Electronically Signed   By: Marcello Moores  Register   On: 04/02/2014 15:59   Ct  Chest W Contrast  04/02/2014   CLINICAL DATA:  Fall from roof with known bilateral wrist fractures. Bilateral shoulder and neck pain.  EXAM: CT CHEST, ABDOMEN, AND PELVIS WITH CONTRAST  TECHNIQUE: Multidetector CT imaging of the chest, abdomen and pelvis was performed following the standard protocol during bolus administration of intravenous contrast.  CONTRAST:  180m OMNIPAQUE IOHEXOL 300 MG/ML  SOLN  COMPARISON:  Multiple exams, including 06/28/2013 and 07/30/2007  FINDINGS: CT CHEST FINDINGS  Coronary atherosclerosis involving the left anterior descending and circumflex coronary arteries. No mediastinal hematoma or irregularity of the thoracic aorta or branch vessels identified.  No pathologic thoracic adenopathy. No pneumothorax or pulmonary contusion. The lungs appear clear. No pleural effusion.  Suspected small type 2 hiatal hernia.  CT ABDOMEN AND PELVIS FINDINGS  Hepatobiliary: Diffuse hepatic steatosis. Mild sparing along the gallbladder fossa.  Pancreas: Intact  Spleen: Intact  Adrenals/Urinary Tract: Unremarkable  Stomach/Bowel: Small type 2 hiatal hernia.  Vascular/Lymphatic: Minimal aortoiliac atherosclerosis.  Reproductive: Unremarkable  Other: No supplemental non-categorized findings.  Musculoskeletal: Lower lumbar degenerative facet arthropathy.  IMPRESSION: 1. No significant acute posttraumatic findings in the chest or abdomen. 2. Various ancillary findings include coronary atherosclerosis; a small type 2 hiatal hernia; diffuse hepatic steatosis ; and lower lumbar degenerative facet arthropathy.   Electronically Signed   By: WSherryl BartersM.D.   On: 04/02/2014 16:30   Ct Cervical Spine Wo Contrast  04/02/2014   CLINICAL DATA:  Acute neck pain after fall off roof.  EXAM: CT CERVICAL SPINE WITHOUT CONTRAST  TECHNIQUE: Multidetector CT imaging of the cervical spine was performed without intravenous contrast. Multiplanar CT image reconstructions were also generated.  COMPARISON:  October 18, 2013.   FINDINGS: No fracture or spondylolisthesis is noted. Mild degenerative disc disease is noted at C6-7 with anterior and posterior osteophyte formation. Mild degenerative disc disease is also noted at C3-4 with posterior osteophyte formation. Posterior facet joints appear normal.  IMPRESSION: Degenerative changes as described above. No fracture or spondylolisthesis is seen in the cervical spine.   Electronically Signed   By: JSabino DickM.D.   On: 04/02/2014 16:24   Ct Abdomen Pelvis W Contrast  04/02/2014   CLINICAL DATA:  Fall from roof with known bilateral wrist fractures. Bilateral shoulder and neck pain.  EXAM: CT CHEST, ABDOMEN, AND PELVIS WITH CONTRAST  TECHNIQUE: Multidetector CT imaging of the chest, abdomen and pelvis was performed following the standard protocol during bolus administration of intravenous contrast.  CONTRAST:  1074mOMNIPAQUE IOHEXOL 300 MG/ML  SOLN  COMPARISON:  Multiple exams, including 06/28/2013 and 07/30/2007  FINDINGS: CT CHEST FINDINGS  Coronary atherosclerosis involving the left anterior descending and circumflex coronary arteries. No mediastinal hematoma or irregularity of the thoracic aorta or branch vessels identified.  No pathologic thoracic adenopathy. No pneumothorax or pulmonary contusion. The lungs appear clear. No pleural effusion.  Suspected small type 2 hiatal hernia.  CT ABDOMEN AND PELVIS FINDINGS  Hepatobiliary: Diffuse hepatic steatosis. Mild sparing along the gallbladder fossa.  Pancreas: Intact  Spleen: Intact  Adrenals/Urinary Tract: Unremarkable  Stomach/Bowel: Small type 2 hiatal hernia.  Vascular/Lymphatic: Minimal aortoiliac atherosclerosis.  Reproductive: Unremarkable  Other: No supplemental non-categorized findings.  Musculoskeletal: Lower lumbar degenerative facet arthropathy.  IMPRESSION: 1. No significant acute posttraumatic findings in the chest or abdomen. 2. Various ancillary findings include coronary atherosclerosis; a small type 2 hiatal hernia;  diffuse hepatic steatosis ; and lower lumbar degenerative facet arthropathy.   Electronically Signed   By: Sherryl Barters M.D.   On: 04/02/2014 16:30   Dg Pelvis Portable  04/02/2014   CLINICAL DATA:  Acute pelvic pain after fall from roof.  EXAM: PORTABLE PELVIS 1-2 VIEWS  COMPARISON:  None.  FINDINGS: There is no evidence of pelvic fracture or diastasis. No pelvic bone lesions are seen.  IMPRESSION: Normal pelvis.   Electronically Signed   By: Sabino Dick M.D.   On: 04/02/2014 16:00   Dg Chest Portable 1 View  04/02/2014   CLINICAL DATA:  [place] off roof.  Pain.  Initial evaluation .  EXAM: PORTABLE CHEST - 1 VIEW  COMPARISON:  03/06/2012.  FINDINGS: Mediastinum and hilar structures normal. Mild cardiomegaly, normal pulmonary vascularity. No pleural effusion or pneumothorax. No acute bony abnormalities identified.  IMPRESSION: No active disease.   Electronically Signed   By: Marcello Moores  Register   On: 04/02/2014 15:57    Review of Systems  Constitutional: Negative.  Negative for fever.  HENT: Negative.   Eyes: Negative.   Respiratory: Negative.   Cardiovascular: Negative for chest pain and claudication.  Gastrointestinal: Negative.   Musculoskeletal:       Complains of bilateral wrist fractures, elbow contusion, shoulder pain bilaterally.  Endo/Heme/Allergies: Negative.   Psychiatric/Behavioral: Negative.     Blood pressure 154/76, pulse 88, temperature 97.6 F (36.4 C), temperature source Oral, resp. rate 17, SpO2 92.00%. Physical Exam  Obese white male alert complains of bilateral wrist pain.  He has a normal HEENT exam. Clavicles are nontender. Chest is equal expansion no shortness of breath. Abdomen is protuberant/obese nontender nondistended. Patient has left upper extremity with a deformed distal radius and some subjective sensation loss in the hand. The patient has some degree of contusive injury to his elbow an abrasion on his shoulder. The elbow is palpably stable. Upper arm and  shoulder are palpated and there is no obvious deformity.  I reviewed the patient's right upper extremity as well. The right upper extremity has an obvious distal radius fracture with acute carpal tunnel symptomatology is well. Both upper extremities have intact pulse and refill. Elbow on the right side is tender and has some bruising. There is no palpable bony abnormality.  His shoulder is tender on the right side as well but there's no palpable crepitance or bony abnormality.  Lower extremity examination shows intact refill normal straight leg raise no evidence of motor or sensory dysfunction. He has no evidence of bony step-off or obvious lesion.  He has multiple body tattoos.  Patient has x-rays which show distal radius fractures bilaterally which are displaced. His shoulders are reviewed on the CT scan out film which do not show any obvious fracture.   Assessment/Plan #1 status post fall from the with shoulder and elbow pain. #2 acute displaced right distal radius fracture with carpal tunnel syndrome acute in nature as well as likely median nerve contusion #3 acute displaced left distal radius fracture with acute carpal tunnel symptomatology as well as likely median nerve contusion #4 history of coronary disease as outlined.  We're plan to proceed with ORIF of his wrist given the acute displacement. I discussed with the  patient ORIF as well as release of the median nerve repair reconstruction is necessary and we will also perform a fasciotomy. Patient understands and desires to proceed. He will be admitted postoperatively.  Cardiology is seen the patient and recommended telemetry postop. We appreciate their help  Patient will have dedicated elbow x-rays performed in the operative theater and I'll get dedicated shoulder views postoperatively as well.  We'll need to get social services to see the patient.  This surgical and there was discussed with patient at length and he understands the  plans.  We'll perform secondary trauma survey in the ensuing hours and days.  All questions have been addressed.  We are planning surgery for your upper extremity. The risk and benefits of surgery include risk of bleeding infection anesthesia damage to normal structures and failure of the surgery to accomplish its intended goals of relieving symptoms and restoring function with this in mind we'll going to proceed. I have specifically discussed with the patient the pre-and postoperative regime and the does and don'ts and risk and benefits in great detail. Risk and benefits of surgery also include risk of dystrophy chronic nerve pain failure of the healing process to go onto completion and other inherent risks of surgery The relavent the pathophysiology of the disease/injury process, as well as the alternatives for treatment and postoperative course of action has been discussed in great detail with the patient who desires to proceed.  We will do everything in our power to help you (the patient) restore function to the upper extremity. Is a pleasure to see this patient today.   Aerabella Galasso III,Tinsleigh Slovacek M 04/02/2014, 8:00 PM

## 2014-04-02 NOTE — Transfer of Care (Signed)
Immediate Anesthesia Transfer of Care Note  Patient: [name]  Procedure(s) Performed: Procedure(s): OPEN REDUCTION INTERNAL FIXATION (ORIF) WRIST FRACTURE AND CARPAL TUNNEL RELEASES (Bilateral)  Patient Location: PACU  Anesthesia Type:General  Level of Consciousness: responds to stimulation  Airway & Oxygen Therapy: Patient Spontanous Breathing and Patient connected to nasal cannula oxygen  Post-op Assessment: Report given to PACU RN, Post -op Vital signs reviewed and stable and Patient moving all extremities  Post vital signs: Reviewed and stable  Complications: No apparent anesthesia complications

## 2014-04-02 NOTE — ED Notes (Signed)
Pt states he needs to urinate. Refused in and out catheterization. Urinal placed between legs.

## 2014-04-02 NOTE — ED Notes (Signed)
Family at bedside. 

## 2014-04-02 NOTE — ED Notes (Signed)
Pt spoke with wife on staff cell phone. Wife is on her way to the ER.

## 2014-04-02 NOTE — ED Notes (Signed)
To OR

## 2014-04-02 NOTE — ED Provider Notes (Signed)
CSN: [id number]     Arrival date & time 04/02/14  1500 History   First MD Initiated Contact with Patient 04/02/14 1503     Chief Complaint  Patient presents with  . Fall  . Wrist Pain     (Consider location/radiation/quality/duration/timing/severity/associated sxs/prior Treatment) HPI Patient presents after a fall. He was working on a roof, and fell ~1 story. During the fall he had his L wrist get caught in a ladder. He landed on his front-side. No LOC / confusion / disorientation / asym weakness. Since the fall he has had pain in both distal UE w diminished ability to move the digits. He also c/o pain in the stomach, chest, neck. Pain is severe, not substantially improved with narcotics provided en route. Patient states that he takes none of his prescribed medications, 2/2 lack of insurance.   Past Medical History  Diagnosis Date  . Arteriosclerotic cardiovascular disease (ASCVD) 11/10/09    Rothbart-BMS to circumflex posterior acute MI in the setting of cocaine use, peak CK of 3590, MB of 511 and troponin of 48; posterolateral hypokinesis with EF of 50% and total obstruction of circumflex; [facility] in 09/2010 40 chest pain, d-dimer of 1.57 with negative CT; EF of 45% with negative stress echo  . Hypertension   . Hyperlipidemia     lipid profile in 2011:185, 169, 37, 114; 2012: Total cholesterol of 311 and triglycerides of 1730  . Tobacco abuse     40 pack years; 0.5 PPD  . Gastroesophageal reflux disease   . Cocaine abuse     remote, quit 2010  . 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 MI, old    Past Surgical History  Procedure Laterality Date  . Appendectomy  2009  . Knee arthroscopy    . Coronary stent placement  2011    bare metal  . Nissen fundoplication  1992   Family History  Problem Relation Age of Onset  . Heart disease    . Arthritis    . Lung disease    . Cancer    . Asthma    . Kidney disease     History  Substance Use Topics    . Smoking status: Current Every Day Smoker -- 0.50 packs/day for 26 years    Types: Cigarettes  . Smokeless tobacco: Never Used  . Alcohol Use: Yes     Comment: occasionally     Review of Systems  All other systems reviewed and are negative.     Allergies  Review of patient's allergies indicates no known allergies.  Home Medications   Prior to Admission medications   Medication Sig Start Date End Date Taking? Authorizing Provider  albuterol (PROVENTIL HFA;VENTOLIN HFA) 108 (90 BASE) MCG/ACT inhaler Inhale 1-2 puffs into the lungs every 4 (four) hours as needed for wheezing or shortness of breath (rescue inhaler).    Historical Provider, MD  aspirin EC 81 MG tablet Take 81 mg by mouth daily.    Historical Provider, MD  nitroGLYCERIN (NITROSTAT) 0.4 MG SL tablet Place 0.4 mg under the tongue every 5 (five) minutes x 3 doses as needed. Chest pain.    Historical Provider, MD  oxyCODONE-acetaminophen (PERCOCET/ROXICET) 5-325 MG per tablet Take 1 tablet by mouth every 4 (four) hours as needed for severe pain. 02/02/14   Joya Gaskinsonald W Wickline, MD   BP 157/91  Pulse 86  Temp(Src) 97.6 F (36.4 C) (Oral)  Resp 16  SpO2 99% Physical  Exam  Nursing note and vitals reviewed. Constitutional: He is oriented to person, place, and time. He appears well-developed. He appears distressed. Cervical collar and backboard in place.  HENT:  Head: Normocephalic and atraumatic.    Nose: Nose normal.  Mouth/Throat: Oropharynx is clear and moist.  Eyes: Conjunctivae and EOM are normal. Pupils are equal, round, and reactive to light.  Neck: No tracheal deviation present.  Cardiovascular: Regular rhythm and intact distal pulses.  Tachycardia present.   Pulmonary/Chest: No stridor. Tachypnea noted. No respiratory distress.  Abdominal: He exhibits no distension. There is generalized tenderness. There is guarding. There is no rebound.    Musculoskeletal: He exhibits no edema.       Right hip: Normal.        Left hip: Normal.       Right knee: Normal.       Left knee: Normal.       Right ankle: Normal.       Left ankle: Normal.       Arms: Neurological: He is alert and oriented to person, place, and time. No cranial nerve deficit. He exhibits normal muscle tone. Coordination normal.  Skin: Skin is warm and dry.  Psychiatric: His mood appears anxious. His speech is rapid and/or pressured.    ED Course  Procedures (including critical care time) Labs Review Labs Reviewed  CDS SEROLOGY  COMPREHENSIVE METABOLIC PANEL  CBC  ETHANOL  PROTIME-INR  SAMPLE TO BLOOD BANK    Cardiac: 101 st, abnormal O2- 99%ra, nml  Imaging Review No results found.   EKG Interpretation None     EMERGENCY DEPARTMENT [place] FAST EXAM  INDICATIONS:Blunt trauma to the Thorax and Blunt injury of abdomen  PERFORMED BY: Myself  IMAGES ARCHIVED?: No trauma machine  FINDINGS: All views negative  LIMITATIONS:  Body habitus and Emergent procedure  INTERPRETATION:  No abdominal free fluid  COMMENT:  Procedure well tolerated aside from pain with palpation throughout the stomach  Update: On repeat exam the patient is awake alert, though he continues to complain of pain in both wrists.   Update: Patient returns from x-ray, I discussed the findings with our hand surgeon on call, who will assist with treatment of the patient.   Update: CT scans unremarkable for intra-abdominal, thoracic, cervical injuries.   MDM   Final diagnoses:  Injuries, multiple    Patient presents after a mechanical fall with gross deformities both wrists.  Given the patient's residence to move his digits, disruption of the nerves was a consideration.  Patient's wrists head fracture of each bone bilaterally. After discussion with    our hand surgeon, the patient had further evaluation and management in the operating room.  Gerhard Munch, MD 04/03/14 0003

## 2014-04-02 NOTE — ED Notes (Signed)
Hand Surgeon at bedside.

## 2014-04-02 NOTE — ED Notes (Signed)
Dr. Amanda PeaGramig to see pt. Requests that wrists not be splinted until he sees the patient.

## 2014-04-02 NOTE — ED Notes (Signed)
Consent form signed at this time by wife.

## 2014-04-02 NOTE — Anesthesia Procedure Notes (Signed)
Procedure Name: Intubation Date/Time: 04/02/2014 8:14 PM Performed by: Luster LandsbergHASE, Laysha Childers R Pre-anesthesia Checklist: Patient identified, Emergency Drugs available, Suction available and Patient being monitored Patient Re-evaluated:Patient Re-evaluated prior to inductionOxygen Delivery Method: Circle system utilized Preoxygenation: Pre-oxygenation with 100% oxygen Intubation Type: IV induction, Rapid sequence and Cricoid Pressure applied Laryngoscope Size: Mac and 3 Grade View: Grade II Tube size: 7.5 mm Number of attempts: 1 Airway Equipment and Method: Stylet Placement Confirmation: ETT inserted through vocal cords under direct vision,  positive ETCO2 and breath sounds checked- equal and bilateral Secured at: 24 cm Tube secured with: Tape Dental Injury: Teeth and Oropharynx as per pre-operative assessment

## 2014-04-03 ENCOUNTER — Encounter (HOSPITAL_COMMUNITY): Payer: Self-pay | Admitting: Orthopedic Surgery

## 2014-04-03 ENCOUNTER — Inpatient Hospital Stay (HOSPITAL_COMMUNITY): Payer: Self-pay

## 2014-04-03 LAB — LIPID PANEL
CHOLESTEROL: 199 mg/dL (ref 0–200)
HDL: 31 mg/dL — ABNORMAL LOW (ref >39)
LDL Cholesterol: 100 mg/dL — ABNORMAL HIGH (ref 0–99)
Total CHOL/HDL Ratio: 6.4 RATIO
Triglycerides: 340 mg/dL — ABNORMAL HIGH (ref <150)
VLDL: 68 mg/dL — ABNORMAL HIGH (ref 0–40)

## 2014-04-03 MED ORDER — SIMVASTATIN 20 MG PO TABS
20.0000 mg | ORAL_TABLET | Freq: Every day | ORAL | Status: DC
  Administered 2014-04-03: 20 mg via ORAL
  Filled 2014-04-03 (×2): qty 1

## 2014-04-03 MED ORDER — SODIUM CHLORIDE 0.45 % IV SOLN
INTRAVENOUS | Status: DC
  Administered 2014-04-03 – 2014-04-04 (×3): via INTRAVENOUS

## 2014-04-03 MED ORDER — HYDROMORPHONE HCL 1 MG/ML IJ SOLN
0.5000 mg | INTRAMUSCULAR | Status: DC | PRN
  Administered 2014-04-03 – 2014-04-04 (×6): 1 mg via INTRAVENOUS
  Filled 2014-04-03 (×7): qty 1

## 2014-04-03 MED ORDER — ONDANSETRON HCL 4 MG PO TABS: 4.0000 mg | ORAL_TABLET | Freq: Four times a day (QID) | ORAL | Status: DC | PRN

## 2014-04-03 MED ORDER — ASPIRIN EC 81 MG PO TBEC
81.0000 mg | DELAYED_RELEASE_TABLET | Freq: Every day | ORAL | Status: DC
  Administered 2014-04-03 – 2014-04-04 (×2): 81 mg via ORAL
  Filled 2014-04-03 (×2): qty 1

## 2014-04-03 MED ORDER — INFLUENZA VAC SPLIT QUAD 0.5 ML IM SUSY
0.5000 mL | PREFILLED_SYRINGE | INTRAMUSCULAR | Status: DC
  Filled 2014-04-03: qty 0.5

## 2014-04-03 MED ORDER — MORPHINE SULFATE 2 MG/ML IJ SOLN
1.0000 mg | INTRAMUSCULAR | Status: DC | PRN
  Administered 2014-04-03: 1 mg via INTRAVENOUS
  Filled 2014-04-03: qty 1

## 2014-04-03 MED ORDER — NITROGLYCERIN 0.4 MG SL SUBL: 0.4000 mg | SUBLINGUAL_TABLET | SUBLINGUAL | Status: DC | PRN

## 2014-04-03 MED ORDER — VITAMIN C 500 MG PO TABS
1000.0000 mg | ORAL_TABLET | Freq: Every day | ORAL | Status: DC
  Administered 2014-04-03 – 2014-04-04 (×2): 1000 mg via ORAL
  Filled 2014-04-03 (×2): qty 2

## 2014-04-03 MED ORDER — NICOTINE 7 MG/24HR TD PT24
7.0000 mg | MEDICATED_PATCH | Freq: Every day | TRANSDERMAL | Status: DC
  Administered 2014-04-03: 7 mg via TRANSDERMAL
  Filled 2014-04-03 (×2): qty 1

## 2014-04-03 MED ORDER — SENNA 8.6 MG PO TABS
1.0000 | ORAL_TABLET | Freq: Two times a day (BID) | ORAL | Status: DC
  Administered 2014-04-03 – 2014-04-04 (×3): 8.6 mg via ORAL
  Filled 2014-04-03 (×5): qty 1

## 2014-04-03 MED ORDER — MORPHINE SULFATE 4 MG/ML IJ SOLN
4.0000 mg | INTRAMUSCULAR | Status: DC | PRN
  Administered 2014-04-03 (×2): 4 mg via INTRAVENOUS
  Filled 2014-04-03 (×2): qty 1

## 2014-04-03 MED ORDER — CEFAZOLIN SODIUM 1-5 GM-% IV SOLN
1.0000 g | Freq: Three times a day (TID) | INTRAVENOUS | Status: DC
  Administered 2014-04-03 – 2014-04-04 (×4): 1 g via INTRAVENOUS
  Filled 2014-04-03 (×7): qty 50

## 2014-04-03 MED ORDER — ONDANSETRON HCL 4 MG/2ML IJ SOLN: 4.0000 mg | Freq: Four times a day (QID) | INTRAMUSCULAR | Status: DC | PRN

## 2014-04-03 MED ORDER — ALPRAZOLAM 0.5 MG PO TABS
0.5000 mg | ORAL_TABLET | Freq: Four times a day (QID) | ORAL | Status: DC | PRN
  Administered 2014-04-03 (×3): 0.5 mg via ORAL
  Filled 2014-04-03 (×2): qty 1
  Filled 2014-04-03: qty 2

## 2014-04-03 MED ORDER — HYDROMORPHONE HCL 2 MG PO TABS
2.0000 mg | ORAL_TABLET | ORAL | Status: DC | PRN
  Administered 2014-04-03 – 2014-04-04 (×4): 2 mg via ORAL
  Filled 2014-04-03 (×4): qty 1

## 2014-04-03 MED ORDER — PANTOPRAZOLE SODIUM 40 MG PO TBEC: 40.0000 mg | DELAYED_RELEASE_TABLET | Freq: Two times a day (BID) | ORAL | Status: DC | PRN

## 2014-04-03 MED ORDER — DIPHENHYDRAMINE HCL 25 MG PO CAPS: 25.0000 mg | ORAL_CAPSULE | Freq: Four times a day (QID) | ORAL | Status: DC | PRN

## 2014-04-03 MED ORDER — CEFAZOLIN SODIUM 1-5 GM-% IV SOLN
1.0000 g | INTRAVENOUS | Status: AC
  Administered 2014-04-03: 1 g via INTRAVENOUS
  Filled 2014-04-03: qty 50

## 2014-04-03 NOTE — Progress Notes (Signed)
PT Cancellation Note  Patient Details Name: Trish FountainMichael B Mckain MRN: [id number] DOB: [birth year]   Cancelled Treatment:    Reason Eval/Treat Not Completed: PT screened, no needs identified, will sign off.  Spoke with OT who indicates no PT needs at this time.  Will sign off.     Veyda Kaufman, Alison MurrayMegan F 04/03/2014, 3:11 PM

## 2014-04-03 NOTE — Progress Notes (Signed)
Utilization review completed.  

## 2014-04-03 NOTE — Progress Notes (Signed)
Subjective: 1 Day Post-Op Procedure(s) (LRB): OPEN REDUCTION INTERNAL FIXATION (ORIF) WRIST FRACTURE AND CARPAL TUNNEL RELEASES (Bilateral) Patient reports pain as moderate.    Patient is complaining of pain is appropriate to his injuries.  Patient notes no new symptoms. His elbow and shoulder examination looks better. He is tolerating liquid intake. He is voiding.  Objective: Vital signs in last 24 hours: Temp:  [97.3 F (36.3 C)-98.8 F (37.1 C)] 98.6 F (37 C) (10/09 0459) Pulse Rate:  [64-94] 79 (10/09 0459) Resp:  [10-22] 17 (10/09 0459) BP: (138-186)/(76-106) 176/83 mmHg (10/09 0459) SpO2:  [92 %-100 %] 98 % (10/09 0459)  Intake/Output from previous day: 10/08 0701 - 10/09 0700 In: 1390 [P.O.:90; I.V.:1300] Out: 730 [Urine:700; Blood:30] Intake/Output this shift: Total I/O In: 1390 [P.O.:90; I.V.:1300] Out: 730 [Urine:700; Blood:30]   Recent Labs  04/02/14 1622  HGB 14.8    Recent Labs  04/02/14 1622  WBC 8.8  RBC 4.68  HCT 42.0  PLT 198    Recent Labs  04/02/14 1622  NA 136*  K 3.8  CL 101  CO2 22  BUN 12  CREATININE 0.96  GLUCOSE 96  CALCIUM 8.2*    Recent Labs  04/02/14 1622  INR 1.03   physical exam: Patient is alert and oriented. He complains of pain. He has good refill to the fingertips and is able to move them. I have shown him range of motion massage and edema control and will order [place] slings.  Drains are working well we'll DC the drains tomorrow.  At present time I stressed the importance of the elbow and finger range of motion.  He is moving his shoulders much better. He has taken with the above his head.  His lower extremities are stable and nontender he does have IV access in his foot.  HEENT is within normal limits and no new complaints are noted. He denies chest pain shortness of breath or abdominal pain.  Neurologically intact ABD soft Neurovascular intact Intact pulses distally No cellulitis present Compartment  soft  Assessment/Plan: 1 Day Post-Op Procedure(s) (LRB): OPEN REDUCTION INTERNAL FIXATION (ORIF) WRIST FRACTURE AND CARPAL TUNNEL RELEASES (Bilateral) Patient is doing reasonably well status post surgical and her attention less than 12 hours out.  We'll continue IV antibiotics. We will continue pain control. I discussed with the patient notes as to the of range of motion. I will ask occupational therapy to see him for range of motion and edema control and other measures.  At present time he is exam is fairly stable.  We'll await cardiology and their input.  He has likely a discharge date set for tomorrow if things go smoothly. Hopefully we can get him appropriate cardiac followup given his multiple needs and presentation. I have given him my card he'll followup in my office in 12 days if things go smoothly. He has good family support I do feel that he will be a good transition nicely to home environment.  I discussed with him healthy living habits etc. which he needs to institute Plan for discharge tomorrow  Karen ChafeGRAMIG III,Breslin Hemann M 04/03/2014, 6:10 AM

## 2014-04-03 NOTE — Progress Notes (Signed)
Occupational Therapy Evaluation Patient Details Name: Trish FountainMichael B Gengler MRN: [id number] DOB: February 24, 1971 Today's Date: 04/03/2014    History of Present Illness Charlaine DaltonMichael B. Tanya Nonesickard is a 43 y.o. Male s/p Bilateral ORIF for wrist fractures and Carpal Tunnel Release on 04/02/14 after a fall from a roof.    Clinical Impression   PTA pt lived at home and was independent with ADLs and IADLs. Pt currently limited by decreased ROM and increased pain which impair his independence with ADLs. Pt's wife present for education. Education and training completed for UE exercises, functional use of UEs, use of built up handles, and edema control. No further acute OT needs. Pt is eager for d/c.     Follow Up Recommendations  No OT follow up;Supervision - Intermittent    Equipment Recommendations  None recommended by OT    Recommendations for Other Services       Precautions / Restrictions Precautions Required Braces or Orthoses: Other Brace/Splint (Bil cast/splint from MCPs to mid forearm)      Mobility Bed Mobility Overal bed mobility: Modified Independent                Transfers Overall transfer level: Needs assistance Equipment used: None Transfers: Sit to/from Stand Sit to Stand: Supervision         General transfer comment: Supervision for safety due to pain medications         ADL Overall ADL's : Needs assistance/impaired Eating/Feeding: Minimal assistance;With adaptive utensils;Sitting Eating/Feeding Details (indicate cue type and reason): pt able to grasp built up handle but has difficulty scooping food and flexing elbow fully to bring to mouth. Grooming: Maximal assistance;Sitting Grooming Details (indicate cue type and reason): Educated pt/wife on use of built up foam handle for toothbrush and other items. Pt will have wife assist. Educated pt and wife on keeping splints and wrappings dry.  Upper Body Bathing: Maximal assistance;Sitting Upper Body Bathing Details  (indicate cue type and reason): Pt/Wife instructed on keeping splints/bandaging dry and clean. Educated on use of garbage bags, plastic wrap, or rubber gloves during bathing. Pt's wife reports that they have a hand held shower and she will bathe pt.  Lower Body Bathing: Maximal assistance;Sit to/from stand Lower Body Bathing Details (indicate cue type and reason): Pt's wife will complete tasks using hand held shower head Upper Body Dressing : Moderate assistance;Sitting Upper Body Dressing Details (indicate cue type and reason): Pt able to assist and encouraged to do so for UE ROM during functional tasks Lower Body Dressing: Maximal assistance;Sit to/from stand Lower Body Dressing Details (indicate cue type and reason): Pt's wife will complete for pt.  Toilet Transfer: Supervision/safety;Ambulation     Toileting - Clothing Manipulation Details (indicate cue type and reason): Pt wife will assist     Functional mobility during ADLs: Supervision/safety General ADL Comments: Pt/wife educated on edema control through elevation and ice. Pt's wife will assist with ADLs while pt recovers. Issued foam for built up handles to promote independence with self-feeding and grooming tasks.      Vision  Pt reports no change from baseline.                    Perception Perception Perception Tested?: No   Praxis Praxis Praxis tested?: Within functional limits    Pertinent Vitals/Pain Pain Assessment: 0-10 Pain Score: 7  Pain Location: L > R wrist/anterior forearm Pain Descriptors / Indicators: Cramping;Burning;Throbbing Pain Intervention(s): Limited activity within patient's tolerance;Monitored during session;Repositioned;Premedicated before session;Ice applied;Other (comment) (elevation)  Hand Dominance Right   Extremity/Trunk Assessment Upper Extremity Assessment Upper Extremity Assessment: RUE deficits/detail;LUE deficits/detail RUE Deficits / Details: immobilized in splint from  MCPs to mid forearm. Decreased grasp due to MCP block, pain, and edema. Pt able to grasp/flexion built up handle for utensils/toothbrush. Pt with limited MCP extension due to edema and educated on extending using other hand, table, or assistance. Shoulder and elbow WFL.  RUE: Unable to fully assess due to immobilization;Unable to fully assess due to pain LUE Deficits / Details: immobilized in splint from MCPs to mid forearm. Decreased grasp due to MCP block, pain, and edema. Pt able to grasp/flexion built up handle for utensils/toothbrush. Pt with limited MCP extension due to edema and educated on extending using other hand, table, or assistance. Left grasp < Right grasp. Shoulder and elbow WFL LUE: Unable to fully assess due to pain;Unable to fully assess due to immobilization   Lower Extremity Assessment Lower Extremity Assessment: Overall WFL for tasks assessed;Generalized weakness   Cervical / Trunk Assessment Cervical / Trunk Assessment: Normal   Communication Communication Communication: No difficulties   Cognition Arousal/Alertness: Lethargic;Suspect due to medications Behavior During Therapy: [facility] for tasks assessed/performed Overall Cognitive Status: Within Functional Limits for tasks assessed                                Home Living Family/patient expects to be discharged to:: Private residence Living Arrangements: Spouse/significant other Available Help at Discharge: Family;Available 24 hours/day Type of Home: House Home Access: Stairs to enter Enter[employer] CorporationEntrance Stairs-Number of Steps: 2-3   Home Layout: One level     Bathroom Shower/Tub: Tub/shower unit Shower/tub characteristics: Engineer, building servicesCurtain Bathroom Toilet: Standard     Home Equipment: None          Prior Functioning/Environment Level of Independence: Independent        Comments: was replacing shingles on a roof when he fell    OT Diagnosis: Generalized weakness;Acute pain   OT Problem List: Decreased  strength;Decreased range of motion;Decreased activity tolerance;Impaired balance (sitting and/or standing);Impaired UE functional use      OT Goals(Current goals can be found in the care plan section) Acute Rehab OT Goals Patient Stated Goal: home tomorrow                End of Session Equipment Utilized During Treatment: Gait belt Nurse Communication: Mobility status  Activity Tolerance: Patient tolerated treatment well Patient left: in chair;with call bell/phone within reach;with family/visitor present;with chair alarm set   Time: 1445-1526 OT Time Calculation (min): 41 min Charges:  OT General Charges $OT Visit: 1 Procedure OT Evaluation $Initial OT Evaluation Tier I: 1 Procedure OT Treatments $Self Care/Home Management : 23-37 mins  Nena JordanMiller, Macen Joslin M 04/03/2014, 3:50 PM  Carney LivingLeeAnn Marie Quentin Shorey, OTR/L Occupational Therapist [phone] (pager)

## 2014-04-03 NOTE — Progress Notes (Signed)
Patient Name: Bradley Hays Date of Encounter: 04/03/2014     Principal Problem:   Closed fracture of distal ends of radius and ulna, bilateral Active Problems:   Arteriosclerotic cardiovascular disease (ASCVD)   Hypertension   Hyperlipidemia   Tobacco abuse   Preoperative cardiovascular examination   Distal radius fracture    SUBJECTIVE Seen laying in bed in double arm casts. Awake but sleepy. No CP or SOB. Wants to follow up in [place] but has no insurance.   CURRENT MEDS . aspirin EC  81 mg Oral Daily  .  ceFAZolin (ANCEF) IV  1 g Intravenous 3 times per day  . metoprolol tartrate  25 mg Oral BID  . nicotine  7 mg Transdermal Daily  . senna  1 tablet Oral BID  . vitamin C  1,000 mg Oral Daily    OBJECTIVE  Filed Vitals:   04/03/14 0000 04/03/14 0019 04/03/14 0459 04/03/14 1109  BP: 161/88 162/100 176/83 176/83  Pulse: 79 82 79   Temp:  98.8 F (37.1 C) 98.6 F (37 C)   TempSrc:  Oral Oral   Resp: 13 15 17    SpO2: 95% 99% 98%     Intake/Output Summary (Last 24 hours) at 04/03/14 1131 Last data filed at 04/03/14 0828  Gross per 24 hour  Intake 1783.75 ml  Output    730 ml  Net 1053.75 ml   There were no vitals filed for this visit.  PHYSICAL EXAM  General: Pleasant, NAD. tatoos Neuro: Alert and oriented X 3. Moves all extremities spontaneously. Psych: Normal affect. HEENT:  Normal  Neck: Supple without bruits or JVD. Lungs:  Resp regular and unlabored, CTA. Heart: RRR no s3, s4, or murmurs. Abdomen: Soft, non-tender, non-distended, BS + x 4.  Extremities: No clubbing, cyanosis or edema. DP/PT/Radials 2+ and equal bilaterally. Bilat arm casts  Accessory Clinical Findings  CBC  Recent Labs  04/02/14 1622  WBC 8.8  HGB 14.8  HCT 42.0  MCV 89.7  PLT 198   Basic Metabolic Panel  Recent Labs  04/02/14 1622  NA 136*  K 3.8  CL 101  CO2 22  GLUCOSE 96  BUN 12  CREATININE 0.96  CALCIUM 8.2*   Liver Function Tests  Recent Labs  04/02/14 1622  AST 30  ALT 42  ALKPHOS 53  BILITOT 0.3  PROT 6.9  ALBUMIN 3.7     TELE  NSR  Radiology/Studies  Dg Wrist 2 Views Left  04/02/2014   CLINICAL DATA:  Initial evaluation for left wrist pain after fall today, on a roof working on shingles, fell off ladder, wrist deformity  EXAM: LEFT WRIST - 2 VIEW  COMPARISON:  None.  FINDINGS: There is a moderately displaced fracture of the ulnar styloid process. There is a comminuted fracture through the distal shaft and metaphysis of the radius. Distal radial metaphysis overlaps the distal shaft of the radius. There is significant apex volar angulation.  IMPRESSION: Fractures of the distal radius and ulna   Electronically Signed   By: Esperanza Heir M.D.   On: 04/02/2014 16:00   Dg Wrist 2 Views Right  04/02/2014   CLINICAL DATA:  Fall off roof.  Initial evaluation .  EXAM: RIGHT WRIST - 2 VIEW  COMPARISON:  None.  FINDINGS: Comminuted distal radial fracture is present with extension of the fracture into the radiocarpal joint space. Fracture is displaced and angulated posteriorly. Displaced ulnar styloid fracture is present. No other focal abnormality.  IMPRESSION: 1.  Comminuted distal radial fracture is present with extension of fracture fragments into the radiocarpal space. Fracture is displaced and angulated posteriorly.  2.  Displaced ulnar styloid fracture.   Electronically Signed   By: Maisie Fushomas  Register   On: 04/02/2014 15:59   Ct Chest W Contrast  04/02/2014   CLINICAL DATA:  Fall from roof with known bilateral wrist fractures. Bilateral shoulder and neck pain.  EXAM: CT CHEST, ABDOMEN, AND PELVIS WITH CONTRAST  TECHNIQUE: Multidetector CT imaging of the chest, abdomen and pelvis was performed following the standard protocol during bolus administration of intravenous contrast.  CONTRAST:  100mL OMNIPAQUE IOHEXOL 300 MG/ML  SOLN  COMPARISON:  Multiple exams, including 06/28/2013 and 07/30/2007  FINDINGS: CT CHEST FINDINGS  Coronary  atherosclerosis involving the left anterior descending and circumflex coronary arteries. No mediastinal hematoma or irregularity of the thoracic aorta or branch vessels identified.  No pathologic thoracic adenopathy. No pneumothorax or pulmonary contusion. The lungs appear clear. No pleural effusion.  Suspected small type 2 hiatal hernia.  CT ABDOMEN AND PELVIS FINDINGS  Hepatobiliary: Diffuse hepatic steatosis. Mild sparing along the gallbladder fossa.  Pancreas: Intact  Spleen: Intact  Adrenals/Urinary Tract: Unremarkable  Stomach/Bowel: Small type 2 hiatal hernia.  Vascular/Lymphatic: Minimal aortoiliac atherosclerosis.  Reproductive: Unremarkable  Other: No supplemental non-categorized findings.  Musculoskeletal: Lower lumbar degenerative facet arthropathy.  IMPRESSION: 1. No significant acute posttraumatic findings in the chest or abdomen. 2. Various ancillary findings include coronary atherosclerosis; a small type 2 hiatal hernia; diffuse hepatic steatosis ; and lower lumbar degenerative facet arthropathy.   Electronically Signed   By: Herbie BaltimoreWalt  Liebkemann M.D.   On: 04/02/2014 16:30   Ct Cervical Spine Wo Contrast  04/02/2014   CLINICAL DATA:  Acute neck pain after fall off roof.  EXAM: CT CERVICAL SPINE WITHOUT CONTRAST  TECHNIQUE: Multidetector CT imaging of the cervical spine was performed without intravenous contrast. Multiplanar CT image reconstructions were also generated.  COMPARISON:  October 18, 2013.  FINDINGS: No fracture or spondylolisthesis is noted. Mild degenerative disc disease is noted at C6-7 with anterior and posterior osteophyte formation. Mild degenerative disc disease is also noted at C3-4 with posterior osteophyte formation. Posterior facet joints appear normal.  IMPRESSION: Degenerative changes as described above. No fracture or spondylolisthesis is seen in the cervical spine.   Electronically Signed   By: Roque LiasJames  Green M.D.   On: 04/02/2014 16:24   Ct Abdomen Pelvis W  Contrast  04/02/2014   CLINICAL DATA:  Fall from roof with known bilateral wrist fractures. Bilateral shoulder and neck pain.  EXAM: CT CHEST, ABDOMEN, AND PELVIS WITH CONTRAST  TECHNIQUE: Multidetector CT imaging of the chest, abdomen and pelvis was performed following the standard protocol during bolus administration of intravenous contrast.  CONTRAST:  100mL OMNIPAQUE IOHEXOL 300 MG/ML  SOLN  COMPARISON:  Multiple exams, including 06/28/2013 and 07/30/2007  FINDINGS: CT CHEST FINDINGS  Coronary atherosclerosis involving the left anterior descending and circumflex coronary arteries. No mediastinal hematoma or irregularity of the thoracic aorta or branch vessels identified.  No pathologic thoracic adenopathy. No pneumothorax or pulmonary contusion. The lungs appear clear. No pleural effusion.  Suspected small type 2 hiatal hernia.  CT ABDOMEN AND PELVIS FINDINGS  Hepatobiliary: Diffuse hepatic steatosis. Mild sparing along the gallbladder fossa.  Pancreas: Intact  Spleen: Intact  Adrenals/Urinary Tract: Unremarkable  Stomach/Bowel: Small type 2 hiatal hernia.  Vascular/Lymphatic: Minimal aortoiliac atherosclerosis.  Reproductive: Unremarkable  Other: No supplemental non-categorized findings.  Musculoskeletal: Lower lumbar degenerative facet arthropathy.  IMPRESSION:  1. No significant acute posttraumatic findings in the chest or abdomen. 2. Various ancillary findings include coronary atherosclerosis; a small type 2 hiatal hernia; diffuse hepatic steatosis ; and lower lumbar degenerative facet arthropathy.   Electronically Signed   By: Herbie BaltimoreWalt  Liebkemann M.D.   On: 04/02/2014 16:30   Dg Pelvis Portable  04/02/2014   CLINICAL DATA:  Acute pelvic pain after fall from roof.  EXAM: PORTABLE PELVIS 1-2 VIEWS  COMPARISON:  None.  FINDINGS: There is no evidence of pelvic fracture or diastasis. No pelvic bone lesions are seen.  IMPRESSION: Normal pelvis.   Electronically Signed   By: Roque LiasJames  Green M.D.   On: 04/02/2014  16:00   Dg Chest Portable 1 View  04/02/2014   CLINICAL DATA:  Larey SeatFell off roof.  Pain.  Initial evaluation .  EXAM: PORTABLE CHEST - 1 VIEW  COMPARISON:  03/06/2012.  FINDINGS: Mediastinum and hilar structures normal. Mild cardiomegaly, normal pulmonary vascularity. No pleural effusion or pneumothorax. No acute bony abnormalities identified.  IMPRESSION: No active disease.   Electronically Signed   By: Maisie Fushomas  Register   On: 04/02/2014 15:57    ASSESSMENT AND PLAN  This is a 43 y.o. male with a past medical history significant for coronary artery disease s/p BMS to the LCX for acute posterior MI in the setting of cocaine abuse in 09/2010 [facility] [facility] ischemic cardiomyopathy, EF 45% at the time, improved to 50-55% by echo in 08/2011 (basal to mid posterolateral scar noted), HTN, dyslipidemia, and tobacco abuse who [facility] with bilateral distal radial fractures after a mechanical fall. Cardilogy was consulted for pre-op clearance.   Fractures- he is now s/p ORIF wrist fractures and carpal tunnel releases (bilateral) today  CAD- He was not on any beta-blockade so it was recommend starting metoprolol tartrate 25 mg twice daily for beta blocker preoperatively and continued postoperatively. -- Restarted on low-dose aspirin based on his CAD -- Will repeat a lipid panel while here -- He will need followup in our [place] office. He has no insurance and will need a care management consult. He was lost to follow up from Dr. Dietrich Patesothbart due to financial hardship.  Ischemic CM- EF 45% initially. His last echo in 2013 showed an improved EF to 50-55% -- Continue BB.   HTN- his BP has been elevated today and yesterday. Consider adding an ACE in the setting of mild residual ischemic CM.  Tobacco abuse- continue nicotine patch  Dispo- I have consulted care management who may be able to get him an orange card for coverage.   Signed, Janetta HoraHOMPSON, KATHRYN R PA-C  Patient seen,  examined. Available data reviewed. Agree with findings, assessment, and plan as outlined by Carlean JewsKatie Thompson, PA-C. Exam reveals alert, overweight male in no distress. There is no peripheral edema. Lungs are clear. Heart is regular rate and rhythm.  Laboratory data reviewed. Patient with known CAD who has now undergone orthopedic surgery without any cardiac problems. He has been lost to medical followup because of limited resources and lack of health insurance. He is agreeable to start aspirin 81 mg daily, metoprolol, and I would recommend starting him on a low-dose generic statin drug for secondary prevention. Will arrange followup in our [place] office. thx  Tonny BollmanMichael Feven Alderfer, M.D. 04/03/2014 4:30 PM

## 2014-04-03 NOTE — Progress Notes (Signed)
Upon admission to floor, updated Lanora ManisElizabeth, RN regarding pt's request for nicotine patch/gum if available and inhaler. Pt also had telemetry monitoring orders, RN to call for telemetry.

## 2014-04-03 NOTE — Anesthesia Postprocedure Evaluation (Signed)
  Anesthesia Post-op Note  Patient: Bradley FountainMichael B Hays  Procedure(s) Performed: Procedure(s): OPEN REDUCTION INTERNAL FIXATION (ORIF) WRIST FRACTURE AND CARPAL TUNNEL RELEASES (Bilateral)  Patient Location: PACU  Anesthesia Type:General  Level of Consciousness: awake  Airway and Oxygen Therapy: Patient Spontanous Breathing  Post-op Pain: moderate  Post-op Assessment: Post-op Vital signs reviewed, Patient's Cardiovascular Status Stable, Respiratory Function Stable, Patent Airway, No signs of Nausea or vomiting and Pain level controlled  Post-op Vital Signs: Reviewed and stable  Last Vitals:  Filed Vitals:   04/03/14 0459  BP: 176/83  Pulse: 79  Temp: 37 C  Resp: 17    Complications: No apparent anesthesia complications

## 2014-04-03 NOTE — Op Note (Signed)
NAMAudelia Hives:  Bradley Hays, Bradley Hays             ACCOUNT NO.:  [id number]  MEDICAL RECORD NO.:  [id number]  LOCATION:  [id number]                        FACILITY:  [facility]  PHYSICIAN:  Dionne AnoWilliam M. Insiya Oshea, M.D.DATE OF BIRTH:  Jul 01, 1970  DATE OF PROCEDURE:  04/02/2014 DATE OF DISCHARGE:                              OPERATIVE REPORT   PREOPERATIVE DIAGNOSIS: 1. Right and left radius fractures distally displaced unstable greater     than 3-part. 2. Bilateral acute carpal tunnel syndrome with median nerve contusion.  POSTOPERATIVE DIAGNOSIS: 1. Right and left radius fractures distally displaced unstable greater     than 3-part. 2. Bilateral acute carpal tunnel syndrome with median nerve contusion.  PROCEDURE: 1. Open reduction and internal fixation, right distal radius fracture,     comminuted complex greater than 5-part intra-articular. 2. Open right carpal tunnel release. 3. AP, lateral, and oblique x-rays right wrist and hand; performed,     examined, and interpreted by myself. 4. ORIF of left radius fracture with DVR plate and screw construct,     similar to the right side, this was comminuted complex greater than     5-part intra-articular fracture. 5. Open left carpal tunnel release. 6. AP, lateral, and oblique x-rays; performed, examined, interpreted     by myself; left wrist and hand.  SURGEON:  Dionne AnoWilliam M. Amanda PeaGramig, M.D.  ASSISTANT:  Karie ChimeraBrian Buchanan, P.A.-C.  COMPLICATIONS:  None.  ANESTHESIA:  General.  TOURNIQUET TIME:  31 minutes on the left and 29 minutes on the right.  DRAINS:  TLS drains placed in the right and left volar radial incisions.  INDICATIONS:  A 43 year old male who fell off of a roof sustained the above-mentioned fractures.  He has elbow contusions, wrist fractures, and acute carpal tunnel syndrome versus median nerve contusion.  I feel he has an element of both and we are going to plan for carpal tunnel release.  We have initiated trauma workup.  CT scans were negative  of the C-spine, chest, and abdomen.  Cardiac clearance had been obtained. We will admit him to the telemetry floor postoperatively.  I have discussed all issues with the patient.  Present time, we planned for operative stabilization of his wrist.  DESCRIPTION OF PROCEDURE:  The patient was patient was seen by myself and Anesthesia, taken to the operating suite, underwent smooth induction general anesthesia.  The patient tolerated this well.  There were no complicating features.  Time-out was called.  Pre and postop check was completed.  Body parts well padded.  Once this done, the left upper extremity is prepped and draped in usual sterile fashion.  He was given a pre scrub, nails were cleansed.  Following this Mr. Wynona NeatBuchanan performed a thorough prep and drape with Betadine scrub and paint.  Following this, arm was elevated, tourniquet was insufflated and sterile field was secured.  Prior to doing so, with time-out being called.  Preoperative antibiotics were given.  We made a volar radial incision.  Dissection was carried down.  FCR tendon sheath was incised palmarly and dorsally. Compartments superficially were all released.  Following this, the flexor tendon was retracted and the carpal canal contents were retracted ulnarly.  Pronator was identified, incised, and reflected.  Following this, we performed reduction of the distal radius fracture, it was held; and following this, a DVR plate and screw construct was accomplished to achieve standard radial height inclination and volar tilt to our satisfaction.  This was placed in standard AO technique.  Following this, copious lavage was placed followed by closure of the pronator and final x-rays; AP, lateral, and oblique including fluoroscopy were performed, examined, and interpreted by myself and Mr. Wynona Neat looked to be excellent.  Following this, a 1 inch incision was made overlying the carpal canal, radial border of the ring finger.   Dissection was carried down.  Palmar fascia was incised.  Distal edge transcarpal ligament was identified and released.  Superficial palmar arch was protected and the entire transverse carpal ligament was released without difficulty which the patient tolerated nicely.  Following this, we deflated the tourniquet, irrigated copiously, obtained hemostasis with bipolar electrocautery.  Placed a TLS drain over the pronator which was closed with Vicryl and then sutured to skin edges with Prolene.  Excellent hemostasis was noted.  A 20 mL of Sensorcaine were placed in the wounds without difficulty.  A 10 mL were placed in the drain and 10 mL were placed in the carpal canal region just under the subcutaneous.  Pink refill to the fingers was noted, compartments soft.  Dressings were applied followed by volar splint. The arm was then elevated.  Once this was done, the patient's head to bed turned and the right arm was similarly prepped and draped in usual sterile fashion with a pre scrub followed by scrub by Mr. Wynona Neat, paint, isolation of sterile field.  Elevation of the tourniquet to 250 mmHg and a volar radial incision.  Volar radial incision was made.  Dissection was carried down. FCR tendon sheath was incised proximally and distally throughout the extent of the incision, and this was released both dorsally and palmarly, I should note.  Full superficial fasciectomy was accomplished given his large forearms.  Following this, carpal canal contents were swept ulnarly.  Pronator incised, reflected, fracture was accessed and debrided appropriately, reduced, and a DVR plate was similarly placed to allow for restoration of radial height, inclination, and volar tilt. This was checked under AP, lateral, and oblique x-rays which were performed, examined, and interpreted by myself and Mr. Wynona Neat without difficulty.  These looked excellent.  We irrigated copiously, closed the pronator, and then  made 1 inch incision over the transverse carpal canal region, dissection was carried down.  Palmar fascia released sharply with knife blade.  Distal edge released under 4.0 loupe magnification, and the transverse carpal ligament was released as well.  Both nerves similarly had no excessive hematoma in the canal, but had evidence of chronic compression and/or pinning.  Patient tolerated this well.  There were no complicating features.  Tourniquet was deflated.  TLS drain placed above the pronator.  Irrigation applied copiously as it was on the left side and following this, the patient then underwent a very careful and cautious closure of the skin edge with Prolene after hemostasis was secured.  Adaptic, Xeroform, gauze, Kerlix, Webril were placed.  There were no complicating features.  Once this done, the patient then underwent a very careful and cautious splint placement.  I should note at this time, we performed fluoroscopy of both elbows; AP, lateral, and oblique x-rays of the right elbow and AP, lateral, and oblique x-rays of the left elbow were performed.  These x-rays were performed, examined, and interpreted and noted to be  stable. Postoperatively, we will obtain shoulder x-rays given the trauma he had as well. AP, lateral, and oblique x-rays of right and left elbows performed, examined, and interpreted by myself and Mr. Wynona NeatBuchanan.  The patient tolerated this well.  There were no complicating features.  He will be admitted and monitored carefully and hopefully do quite well with his recovery.  We appreciate Cardiology seeing him given his cardiac needs and issues.  I would give him a guarded prognosis given all issues in his health and nevertheless we are going to try to do everything we can to make his recovery uneventful.     Dionne AnoWilliam M. Amanda PeaGramig, M.D.     [facility]  D:  04/02/2014  T:  04/03/2014  Job:  [id number]

## 2014-04-04 MED ORDER — SIMVASTATIN 20 MG PO TABS: 20.0000 mg | ORAL_TABLET | Freq: Every day | ORAL | Status: DC

## 2014-04-04 MED ORDER — ALPRAZOLAM 0.5 MG PO TABS: 0.5000 mg | ORAL_TABLET | Freq: Two times a day (BID) | ORAL | Status: DC

## 2014-04-04 MED ORDER — LISINOPRIL 10 MG PO TABS
10.0000 mg | ORAL_TABLET | Freq: Every day | ORAL | Status: DC
  Administered 2014-04-04: 10 mg via ORAL
  Filled 2014-04-04 (×2): qty 1

## 2014-04-04 MED ORDER — HYDROMORPHONE HCL 1 MG/ML IJ SOLN
1.0000 mg | INTRAMUSCULAR | Status: DC | PRN
  Administered 2014-04-04: 1 mg via INTRAVENOUS
  Filled 2014-04-04: qty 1

## 2014-04-04 MED ORDER — LISINOPRIL 10 MG PO TABS: 10.0000 mg | ORAL_TABLET | Freq: Every day | ORAL | Status: DC

## 2014-04-04 MED ORDER — METOPROLOL TARTRATE 25 MG PO TABS: 25.0000 mg | ORAL_TABLET | Freq: Two times a day (BID) | ORAL | Status: DC

## 2014-04-04 MED ORDER — HYDROMORPHONE HCL 2 MG PO TABS: 2.0000 mg | ORAL_TABLET | ORAL | Status: DC | PRN

## 2014-04-04 NOTE — Discharge Summary (Signed)
Physician Discharge Summary  Patient ID: Bradley FountainMichael B Ayo MRN: [id number] DOB/AGE: 1971/03/11 43 y.o.  Admit date: 04/02/2014 Discharge date: 04/04/2014  Admission Diagnoses: Status post fall off a roof with bilateral wrist fractures, median nerve contusion,  acute carpal tunnel syndrome, bilateral shoulder and elbow contusions  Discharge Diagnoses: Same Principal Problem:   Closed fracture of distal ends of radius and ulna, bilateral Active Problems:   Arteriosclerotic cardiovascular disease (ASCVD)   Hypertension   Hyperlipidemia   Tobacco abuse   Preoperative cardiovascular examination   Distal radius fracture   Discharged Condition: fair  Hospital Course: Patient was admitted postop status post reconstruction bilateral wrist. Patient underwent ORIF and carpal tunnel release secondary to the above mentioned diagnoses.  Patient or the hospital stay well. No chest pain he had no problems  On postop day 2 he was stable awake alert and desired through the palm.  Patient is tolerating his diet well. He is voiding well. He is  without signs of DVT.  He is moving his fingers well. He is sensate.  I performed a secondary trauma survey. Abdomen is nontender chest is clear neck and back are nontender. He has pain appropriate in his wrist and no signs of infection. His Endo Tori no signs of DVT.  At present time he is stable for discharge.  I will ask cardiology to make final recommendations.  Consults: cardiology  Significant Diagnostic Studies: labs: See  Treatments: surgery: See op note  Discharge Exam: Blood pressure 200/90, pulse 95, temperature 99.3 F (37.4 C), temperature source Oral, resp. rate 18, SpO2 94.00%. General appearance: alert  Patient has no signs of infection dystrophy or vascular compromise. Drains removed from his bilateral wrist. Short arm splints are intact clean dry without comp caring features. He is sensate. I have noted that he has good motion to  his elbows and shoulders given the contusive injuries. His x-rays are negative.  Overall his exam is quite stable day.  The patient is alert and oriented in no acute distress the patient complains of pain in the affected upper extremity.  The patient is noted to have a normal HEENT exam.  Lung fields show equal chest expansion and no shortness of breath  abdomen exam is nontender without distention.  Lower extremity examination does not show any fracture dislocation or blood clot symptoms.  Pelvis is stable neck and back are stable and nontender  Disposition: 01-Home or Self Care     Medication List    ASK your doctor about these medications       aspirin EC 81 MG tablet  Take 81 mg by mouth daily.     GOODY HEADACHE PO  Take 2 Packages by mouth daily as needed.     nitroGLYCERIN 0.4 MG SL tablet  Commonly known as:  NITROSTAT  Place 0.4 mg under the tongue every 5 (five) minutes x 3 doses as needed. Chest pain.           Follow-up Information   [facility] (The office will call you to make an apppointment. Please call them if you do not hear from them by Monday night. You may see Joni ReiningKathryn Lawrence-  NP or any of the MDs at this office. )    Specialty:  Cardiology   Contact information:   [address] [place] [address] [phone]      Follow up with Karen ChafeGRAMIG III,Maritta Kief M, MD In 12 days. (Please call [phone] to see Dr. Amanda PeaGramig  in 12 days. His office will make the appointment)    Specialty:  Orthopedic Surgery   Contact information:   [address] [place] [address] [phone]       Signed: Karen ChafeGRAMIG III,Lalanya Rufener M 04/04/2014, 11:31 AM

## 2014-04-04 NOTE — Discharge Instructions (Signed)

## 2014-04-04 NOTE — Progress Notes (Signed)
Patient D/C in stable condition with scripts via w/c.

## 2014-04-04 NOTE — Progress Notes (Signed)
    Consulting cardiologist: Dr. Zoila ShutterKenneth Hilty  Subjective:   Complains of pain in the forearms and wrists. No chest pain.   Objective:   Temp:  [98.8 F (37.1 C)-100.2 F (37.9 C)] 99.3 F (37.4 C) (10/10 0649) Pulse Rate:  [82-119] 95 (10/10 0649) Resp:  [18] 18 (10/10 0649) BP: (176-208)/(81-103) 208/90 mmHg (10/10 0649) SpO2:  [94 %-100 %] 94 % (10/10 0649) Last BM Date: 04/02/14  There were no vitals filed for this visit.  Intake/Output Summary (Last 24 hours) at 04/04/14 0828 Last data filed at 04/04/14 0700  Gross per 24 hour  Intake    985 ml  Output    500 ml  Net    485 ml   Exam:  General: No distress.  Lungs: Clear, nonlabored.  Cardiac: RRR, no gallop.  Extremities: Bilateral arm casts.   Lab Results:  Basic Metabolic Panel:  Recent Labs Lab 04/02/14 1622  NA 136*  K 3.8  CL 101  CO2 22  GLUCOSE 96  BUN 12  CREATININE 0.96  CALCIUM 8.2*    Liver Function Tests:  Recent Labs Lab 04/02/14 1622  AST 30  ALT 42  ALKPHOS 53  BILITOT 0.3  PROT 6.9  ALBUMIN 3.7    CBC:  Recent Labs Lab 04/02/14 1622  WBC 8.8  HGB 14.8  HCT 42.0  MCV 89.7  PLT 198    Lipid Panel     Component Value Date/Time   CHOL 199 04/03/2014 1430   TRIG 340* 04/03/2014 1430   HDL 31* 04/03/2014 1430   CHOLHDL 6.4 04/03/2014 1430   VLDL 68* 04/03/2014 1430   LDLCALC 100* 04/03/2014 1430    ECG: Sinus rhythm with PVC and nonspecific ST changes.   Medications:   Scheduled Medications: . aspirin EC  81 mg Oral Daily  .  ceFAZolin (ANCEF) IV  1 g Intravenous 3 times per day  . Influenza vac split quadrivalent PF  0.5 mL Intramuscular Tomorrow-1000  . metoprolol tartrate  25 mg Oral BID  . nicotine  7 mg Transdermal Daily  . senna  1 tablet Oral BID  . simvastatin  20 mg Oral q1800  . vitamin C  1,000 mg Oral Daily     Infusions: . sodium chloride 75 mL/hr at 04/04/14 0620     PRN Medications:  ALPRAZolam, diphenhydrAMINE, HYDROmorphone  (DILAUDID) injection, HYDROmorphone, methocarbamol (ROBAXIN) IV, methocarbamol, nitroGLYCERIN, ondansetron (ZOFRAN) IV, ondansetron, pantoprazole   Assessment:   1. 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 No active angina symptoms.  2. History of ischemic cardiomyopathy, LVEF 50-55% as of most recent evaluation 2013.  3. Essential hypertension, blood pressure not well controlled.  4. Tobacco abuse.  5. Hyperlipidemia, on statin therapy, LDL 100  6. Status post bilateral distal radial fractures after a mechanical fall. Stable postop.   Plan/Discussion:    Continue aspirin, Lopressor, and Zocor. Add lisinopril for additional blood pressure control and management of CAD/cardiomyopathy.   Jonelle SidleSamuel G. Jenay Morici, M.D., F.A.C.C.

## 2014-04-04 NOTE — Progress Notes (Signed)
Patient refuses to wait for BP med to take affect prior to D/C. Asymptomatic. Cardiology called earlier today re: BP - just to take normally scheduled medications - no new orders. Patient D/C in stable condition with scripts.

## 2014-04-06 NOTE — Care Management Note (Signed)
CARE MANAGEMENT NOTE 04/06/2014  Patient:  Bradley Hays,Bradley Hays   Account Number:  [id number]  Date Initiated:  04/06/2014  Documentation initiated by:  Vance PeperBRADY,Zykee Avakian  Subjective/Objective Assessment:   43 yr old male admitted with bilateral wrist fractures, underwent right wrist ORIF, Left wrist ORIF.     Action/Plan:   Patient had no home health therapy or DMe needs.   Anticipated DC Date:  04/04/2014   Anticipated DC Plan:  HOME/SELF CARE      DC Planning Services  NA      Choice offered to / List presented to:     DME arranged  NA        HH arranged  NA      Status of service:  Completed, signed off Medicare Important Message given?   (If response is "NO", the following Medicare IM given date fields will be blank) Date Medicare IM given:   Medicare IM given by:   Date Additional Medicare IM given:   Additional Medicare IM given by:    Discharge Disposition:  HOME/SELF CARE  Per UR Regulation:  Reviewed for med. necessity/level of care/duration of stay

## 2014-05-15 ENCOUNTER — Ambulatory Visit (HOSPITAL_COMMUNITY): Payer: Self-pay | Attending: Orthopedic Surgery

## 2014-06-05 ENCOUNTER — Encounter (HOSPITAL_COMMUNITY): Payer: Self-pay

## 2014-06-05 ENCOUNTER — Ambulatory Visit (HOSPITAL_COMMUNITY)
Admission: RE | Admit: 2014-06-05 | Discharge: 2014-06-05 | Disposition: A | Discharge status: Home / Self Care | Payer: Self-pay | Source: Ambulatory Visit | Attending: Orthopedic Surgery | Admitting: Orthopedic Surgery

## 2014-06-05 DIAGNOSIS — W11XXXD Fall on and from ladder, subsequent encounter: Secondary | ICD-10-CM | POA: Insufficient documentation

## 2014-06-05 DIAGNOSIS — M25632 Stiffness of left wrist, not elsewhere classified: Secondary | ICD-10-CM | POA: Insufficient documentation

## 2014-06-05 DIAGNOSIS — M25531 Pain in right wrist: Secondary | ICD-10-CM | POA: Insufficient documentation

## 2014-06-05 DIAGNOSIS — S52602D Unspecified fracture of lower end of left ulna, subsequent encounter for closed fracture with routine healing: Secondary | ICD-10-CM | POA: Insufficient documentation

## 2014-06-05 DIAGNOSIS — M25631 Stiffness of right wrist, not elsewhere classified: Secondary | ICD-10-CM | POA: Insufficient documentation

## 2014-06-05 DIAGNOSIS — M6281 Muscle weakness (generalized): Secondary | ICD-10-CM | POA: Insufficient documentation

## 2014-06-05 DIAGNOSIS — M25532 Pain in left wrist: Secondary | ICD-10-CM | POA: Insufficient documentation

## 2014-06-05 DIAGNOSIS — M25639 Stiffness of unspecified wrist, not elsewhere classified: Secondary | ICD-10-CM

## 2014-06-05 DIAGNOSIS — R29898 Other symptoms and signs involving the musculoskeletal system: Secondary | ICD-10-CM

## 2014-06-05 DIAGNOSIS — S52501D Unspecified fracture of the lower end of right radius, subsequent encounter for closed fracture with routine healing: Secondary | ICD-10-CM | POA: Insufficient documentation

## 2014-06-05 DIAGNOSIS — S52601D Unspecified fracture of lower end of right ulna, subsequent encounter for closed fracture with routine healing: Secondary | ICD-10-CM | POA: Insufficient documentation

## 2014-06-05 DIAGNOSIS — S52502D Unspecified fracture of the lower end of left radius, subsequent encounter for closed fracture with routine healing: Secondary | ICD-10-CM | POA: Insufficient documentation

## 2014-06-05 NOTE — Patient Instructions (Signed)
AROM: Wrist Extension   With right palm down, bend wrist up. Repeat 5-10 times, 2-3 times per day.  Copyright  VHI. All rights reserved.   AROM: Wrist Flexion   With right palm up, bend wrist up. Repeat 5-10 times, 2-3 times per day.  Copyright  VHI. All rights reserved.   AROM: Forearm Pronation / Supination   With right arm in handshake position, slowly rotate palm down until stretch is felt. Relax. Then rotate palm up until stretch is felt. Repeat 5-10 times, 2-3 times per day.  Copyright  VHI. All rights reserved.   Paper Crumpling Exercise   Begin with right palm down on piece of paper. Maintaining contact between surface and heel of hand, crumple paper into a ball. Repeat 2-5 times, 2-3 times per day.  Copyright  VHI. All rights reserved.     Towel Roll Squeeze   With right forearm resting on surface, gently squeeze towel. Repeat 5-10 times, 2-3 times per day.  Copyright  VHI. All rights reserved.    Marry GuanMarie Rawlings Mahesh Sizemore, MS, OTR/L [facility] [phone]

## 2014-06-05 NOTE — Therapy (Signed)
[facility] [address] [place], [place], [address] Phone: [phone]   Fax:  [phone]  Occupational Therapy Evaluation  Patient Details  Name: Bradley FountainMichael B Hauth MRN: [id number] Date of Birth: [birth year]  Encounter Date: 06/05/2014      OT End of Session - 06/05/14 1236    Visit Number 1   Number of Visits 16   Date for OT Re-Evaluation 07/03/14   Authorization Type No insurance   Authorization Time Period Pt has signed waiver of understanding for self pay (scanned)   OT Start Time 1018   OT Stop Time 1103   OT Time Calculation (min) 45 min   Activity Tolerance Patient tolerated treatment well   Behavior During Therapy [facility] for tasks assessed/performed      Past Medical History  Diagnosis Date  . Arteriosclerotic cardiovascular disease (ASCVD) 11/10/09    Rothbart-BMS to circumflex posterior acute MI in the setting of cocaine use, peak CK of 3590, MB of 511 and troponin of 48; posterolateral hypokinesis with EF of 50% and total obstruction of circumflex; [facility] in 09/2010 40 chest pain, d-dimer of 1.57 with negative CT; EF of 45% with negative stress echo  . Hypertension   . Hyperlipidemia     lipid profile in 2011:185, 169, 37, 114; 2012: Total cholesterol of 311 and triglycerides of 1730  . Tobacco abuse     40 pack years; 0.5 PPD  . Gastroesophageal reflux disease   . Cocaine abuse     remote, quit 2010  . 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 MI, old     Past Surgical History  Procedure Laterality Date  . Appendectomy  2009  . Knee arthroscopy    . Coronary stent placement  2011    bare metal  . Nissen fundoplication  1992  . Orif wrist fracture Bilateral 04/02/2014    Procedure: OPEN REDUCTION INTERNAL FIXATION (ORIF) WRIST FRACTURE AND CARPAL TUNNEL RELEASES;  Surgeon: Dominica SeverinWilliam Gramig, MD;  Location: [facility] OR;  Service: Orthopedics;  Laterality: Bilateral;    There were no vitals taken for this  visit.  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  One of the borads started slideing and i actually fell feet first.  My left hand got caughter in the ladder and I guess i put out my right arm to catch myself."   Pertinent History Pt is 43 yo male presenting to outpatient OT services with bilateral wrist fractures.  Pt was working on a roof on 10/8 when he fell, fracturing bilateral wrists.  pt underwent bilatearl wrist ORIFs and carpal tunner releases the night of 10/8 and 10/9.  he has been using braces on bilateal wrists since.   Special Tests FOTO 31/100   Patient Stated Goals "get better as quick as possible"   Currently in Pain? No/denies  8/10 with movement          [facility] Assessment - 06/05/14 1227    Assessment   Diagnosis Bilateral Wrist Fractures   Onset Date 04/02/14   Precautions   Precautions None   Required Braces or Orthoses Other Brace/Splint  bilateral wrist braces   Restrictions   Weight Bearing Restrictions No  Balance Screen   Has the patient fallen in the past 6 months No   Has the patient had a decrease in activity level because of a fear of falling?  No   Is the patient reluctant to leave their home because of a fear of falling?  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  max tenderness at volar wrist along incision.   Focus on Therapeutic Outcomes (FOTO)  FOTO 31/100   Sensation   Light Touch Appears Intact   Additional Comments tingling sensations at R 3rd and 4th digits - tip to DIP on volar side.   Coordination   Gross Motor Movements are Fluid and Coordinated Yes   Fine Motor Movements are Fluid and Coordinated No  difficulty with 4th and 5th digit opposition   AROM   Overall AROM  Deficits   Right Forearm Pronation 37 Degrees   Right Forearm Supination 50 Degrees   Left Forearm Pronation 50 Degrees   Left Forearm Supination 63 Degrees   Right Wrist Extension 21 Degrees   Right Wrist Flexion 20 Degrees   Right Wrist Radial Deviation 18 Degrees   Right Wrist Ulnar Deviation 17 Degrees   Left Wrist Extension 37 Degrees   Left Wrist Flexion 38 Degrees   Left Wrist Radial Deviation 24 Degrees   Left Wrist Ulnar Deviation 27 Degrees   Right Composite Finger Extension --  full   Right Composite Finger Flexion --  full   Left Composite Finger Extension --  full   Left Composite Finger Flexion --  full   Strength   Grip (lbs) right 5   Right Hand Lateral Pinch 6 lbs   Right Hand 3 Point Pinch 2 lbs   Grip (lbs) L 6   Left Hand Lateral Pinch 7 lbs   Left Hand 3 Point Pinch 3 lbs            OT Education - 06/05/14 1239    Education provided Yes   Education Details wrist AROM, grip strengthening   Person(s) Educated Patient   Methods Explanation;Demonstration;Handout   Comprehension Verbalized understanding;Returned demonstration          OT Short Term Goals - 06/05/14 1240    OT SHORT TERM GOAL #1   Title Pt will be educated on HEP   Time 4   Period Weeks   Status New   OT SHORT TERM GOAL #2   Title Pt will decrease fascial  restrictions to min-moderate level in bilateral wrists   Time 4   Period Weeks   Status New   OT SHORT TERM GOAL #3   Title Pt will improve bilateral wrist flexion and extension by at least 10 degrees in AROM, for imporved ability to push up using his hands.   Time 4   Period Weeks   Status New  OT SHORT TERM GOAL #4   Title Pt will improve bilateral supinatio and prontation by atleast 10 degrees for imporved ability to wash his hair   Time 4   Period Weeks   Status New   OT SHORT TERM GOAL #5   Title Pt will decrease pain with motion to less than 5/10 when using bilateral wrist sin daily tasks.   Time 4   Period Weeks   Status New   Additional Short Term Goals   Additional Short Term Goals Yes   OT SHORT TERM GOAL #6   Title Pt will improve bilateral grip strength to atleast 12 lbs for imporved abilty to open milk cartons   Time 4   Period Weeks   Status New          OT Long Term Goals - 06/05/14 1243    OT LONG TERM GOAL #1   Title Pt will acheive highest level of functionign in all ADL, IADL, work, and leisure tasks   Time 8   Period Weeks   Status New   OT LONG TERM GOAL #2   Title pt will imporve bilateral wrist AROm to [facility] for imporved ability to engage in fishing tasks   Time 8   Period Weeks   Status New   OT LONG TERM GOAL #3   Title Pt will decrease pain with motion in wrists to less than 2/10   Time 8   Period Weeks   Status New   OT LONG TERM GOAL #4   Title Pt will decrease fascial restictiosn to minimal for decreased pain with movement   Time 8   Period Weeks   Status New   OT LONG TERM GOAL #5   Title Pt will imporve bilateral grip strength to atleast 40 lbs, 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   Time 8   Period Weeks   Status New          Plan - 06/05/14 1237    Clinical Impression Statement pt is presenting to outpatient OT with bilateral wrist fractures.  he ahs decreased ROM, decreased strenth, increased fascial  restrictions, increased pain, and decreased overall UE functional use as a result.   Rehab Potential Good   OT Frequency 2x / week   OT Duration 8 weeks   OT Treatment/Interventions Patient/family education;Therapeutic exercise;Therapeutic exercises;Therapeutic activities;Passive range of motion;Moist Heat;Parrafin;Manual Therapy;Self-care/ADL training   Plan Pt will benefit from skilled OT servics to decrease fascial restrictions, increase ROM, increased strength, decrease pain, and promote imporved BUE functional use.     Treatment Plan: MFR and manual strretching, PROM, AROM, grip and pinch strengthening, paraffin as needed, general strengthening   OT Home Exercise Plan Wrist AROM, grip strengthening   Consulted and Agree with Plan of Care Patient       Problem List Patient Active Problem List   Diagnosis Date Noted  . Preoperative cardiovascular examination 04/02/2014  . Closed fracture of distal ends of radius and ulna, bilateral 04/02/2014  . Distal radius fracture 04/02/2014  . Fatty liver 10/27/2011  . RUQ pain 10/27/2011  . Nausea 10/27/2011  . Hypertension   . Hyperlipidemia   . Tobacco abuse   . Gastroesophageal reflux disease   . Cocaine abuse   . Arteriosclerotic cardiovascular disease (ASCVD) 11/10/2009    Marry GuanMarie Rawlings Zorion Nims, MS, OTR/L [facility] [phone] 06/05/2014, 12:48 PM

## 2014-06-10 ENCOUNTER — Ambulatory Visit (HOSPITAL_COMMUNITY): Payer: Self-pay | Admitting: Specialist

## 2014-06-12 ENCOUNTER — Ambulatory Visit (HOSPITAL_COMMUNITY): Payer: Self-pay

## 2014-06-15 ENCOUNTER — Ambulatory Visit (HOSPITAL_COMMUNITY): Payer: Self-pay | Admitting: Specialist

## 2014-06-15 ENCOUNTER — Encounter (HOSPITAL_COMMUNITY): Payer: Self-pay

## 2014-06-15 NOTE — Therapy (Signed)
[place], [place], [address] Phone: [phone]   Fax:  [phone]  Patient Details  Name: Bradley Hays MRN: [id number] Date of Birth: 04-Oct-1970  Encounter Date: 06/15/2014  OCCUPATIONAL THERAPY DISCHARGE SUMMARY  Visits from Start of Care: 1  Current functional level related to goals / functional outcomes: OT LONG TERM GOAL #1    Title Pt will acheive highest level of functionign in all ADL, IADL, work, and leisure tasks   Time 8   Period Weeks   Status New   OT LONG TERM GOAL #2   Title pt will imporve bilateral wrist AROm to [facility] for imporved ability to engage in fishing tasks   Time 8   Period Weeks   Status New   OT LONG TERM GOAL #3   Title Pt will decrease pain with motion in wrists to less than 2/10   Time 8   Period Weeks   Status New   OT LONG TERM GOAL #4   Title Pt will decrease fascial restictiosn to minimal for decreased pain with movement   Time 8   Period Weeks   Status New   OT LONG TERM GOAL #5   Title Pt will imporve bilateral grip strength to atleast 40 lbs, 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   Time 8   Period Weeks   Status New      Remaining deficits: Patient did not return after initial evaluation. Patient discharged due to 3 no shows.    Education / Equipment: N/A Plan:                                                    Patient goals were not met. Patient is being discharged due to not returning since the last visit.  ?????        Ailene Ravel, OTR/L,CBIS  [phone] 06/15/2014, 7:52 PM  [place] [address] [place], [place], [address] Phone: [phone]   Fax:  [phone]

## 2014-06-17 ENCOUNTER — Ambulatory Visit (HOSPITAL_COMMUNITY): Payer: Self-pay

## 2014-06-23 ENCOUNTER — Encounter (HOSPITAL_COMMUNITY): Payer: Self-pay | Admitting: *Deleted

## 2014-06-23 ENCOUNTER — Emergency Department (HOSPITAL_COMMUNITY): Payer: Self-pay

## 2014-06-23 DIAGNOSIS — S8992XA Unspecified injury of left lower leg, initial encounter: Secondary | ICD-10-CM | POA: Insufficient documentation

## 2014-06-23 DIAGNOSIS — Z720 Tobacco use: Secondary | ICD-10-CM | POA: Insufficient documentation

## 2014-06-23 DIAGNOSIS — I252 Old myocardial infarction: Secondary | ICD-10-CM | POA: Insufficient documentation

## 2014-06-23 DIAGNOSIS — I2510 Atherosclerotic heart disease of native coronary artery without angina pectoris: Secondary | ICD-10-CM | POA: Insufficient documentation

## 2014-06-23 DIAGNOSIS — X58XXXA Exposure to other specified factors, initial encounter: Secondary | ICD-10-CM | POA: Insufficient documentation

## 2014-06-23 DIAGNOSIS — Y998 Other external cause status: Secondary | ICD-10-CM | POA: Insufficient documentation

## 2014-06-23 DIAGNOSIS — Y929 Unspecified place or not applicable: Secondary | ICD-10-CM | POA: Insufficient documentation

## 2014-06-23 DIAGNOSIS — Y9389 Activity, other specified: Secondary | ICD-10-CM | POA: Insufficient documentation

## 2014-06-23 DIAGNOSIS — I10 Essential (primary) hypertension: Secondary | ICD-10-CM | POA: Insufficient documentation

## 2014-06-23 NOTE — ED Notes (Signed)
Lt knee pain, onset this am when getting into bed  And knee "popped"

## 2014-06-24 ENCOUNTER — Encounter (HOSPITAL_COMMUNITY): Payer: Self-pay | Admitting: Specialist

## 2014-06-24 ENCOUNTER — Emergency Department (HOSPITAL_COMMUNITY)
Admission: EM | Admit: 2014-06-24 | Discharge: 2014-06-24 | Discharge status: Against Medical Advice | Payer: Self-pay | Attending: Emergency Medicine | Admitting: Emergency Medicine

## 2014-06-24 DIAGNOSIS — T1490XA Injury, unspecified, initial encounter: Secondary | ICD-10-CM

## 2014-06-24 NOTE — ED Notes (Signed)
Unable to locate pt in all waiting areas x 3 attempts.  

## 2014-06-25 ENCOUNTER — Encounter (HOSPITAL_COMMUNITY): Payer: Self-pay | Admitting: Specialist

## 2014-06-29 ENCOUNTER — Encounter (HOSPITAL_COMMUNITY): Payer: Self-pay

## 2014-07-01 ENCOUNTER — Encounter (HOSPITAL_COMMUNITY): Payer: Self-pay

## 2014-07-06 ENCOUNTER — Encounter (HOSPITAL_COMMUNITY): Payer: Self-pay

## 2014-07-08 ENCOUNTER — Encounter (HOSPITAL_COMMUNITY): Payer: Self-pay

## 2014-07-13 ENCOUNTER — Encounter (HOSPITAL_COMMUNITY): Payer: Self-pay

## 2014-07-15 ENCOUNTER — Encounter (HOSPITAL_COMMUNITY): Payer: Self-pay

## 2014-07-20 ENCOUNTER — Encounter (HOSPITAL_COMMUNITY): Payer: Self-pay

## 2014-07-22 ENCOUNTER — Encounter (HOSPITAL_COMMUNITY): Payer: Self-pay

## 2014-08-13 ENCOUNTER — Emergency Department (HOSPITAL_COMMUNITY)
Admission: EM | Admit: 2014-08-13 | Discharge: 2014-08-13 | Disposition: A | Discharge status: Home / Self Care | Payer: Self-pay

## 2014-08-13 NOTE — ED Notes (Signed)
Called pt but pt not in waiting room.

## 2014-08-13 NOTE — ED Notes (Signed)
No answer

## 2014-09-10 ENCOUNTER — Encounter (HOSPITAL_COMMUNITY): Payer: Self-pay | Admitting: *Deleted

## 2014-09-10 ENCOUNTER — Emergency Department (HOSPITAL_COMMUNITY)
Admission: EM | Admit: 2014-09-10 | Discharge: 2014-09-10 | Disposition: A | Discharge status: Home / Self Care | Payer: Medicaid Other | Attending: Emergency Medicine | Admitting: Emergency Medicine

## 2014-09-10 DIAGNOSIS — Z8719 Personal history of other diseases of the digestive system: Secondary | ICD-10-CM | POA: Insufficient documentation

## 2014-09-10 DIAGNOSIS — I10 Essential (primary) hypertension: Secondary | ICD-10-CM | POA: Insufficient documentation

## 2014-09-10 DIAGNOSIS — M25531 Pain in right wrist: Secondary | ICD-10-CM

## 2014-09-10 DIAGNOSIS — E785 Hyperlipidemia, unspecified: Secondary | ICD-10-CM | POA: Insufficient documentation

## 2014-09-10 DIAGNOSIS — I252 Old myocardial infarction: Secondary | ICD-10-CM | POA: Insufficient documentation

## 2014-09-10 DIAGNOSIS — Z8781 Personal history of (healed) traumatic fracture: Secondary | ICD-10-CM | POA: Insufficient documentation

## 2014-09-10 DIAGNOSIS — Z7982 Long term (current) use of aspirin: Secondary | ICD-10-CM | POA: Insufficient documentation

## 2014-09-10 DIAGNOSIS — Z79899 Other long term (current) drug therapy: Secondary | ICD-10-CM | POA: Insufficient documentation

## 2014-09-10 DIAGNOSIS — Z9861 Coronary angioplasty status: Secondary | ICD-10-CM | POA: Insufficient documentation

## 2014-09-10 DIAGNOSIS — Z87828 Personal history of other (healed) physical injury and trauma: Secondary | ICD-10-CM | POA: Insufficient documentation

## 2014-09-10 DIAGNOSIS — Z720 Tobacco use: Secondary | ICD-10-CM | POA: Insufficient documentation

## 2014-09-10 DIAGNOSIS — M25532 Pain in left wrist: Secondary | ICD-10-CM | POA: Insufficient documentation

## 2014-09-10 MED ORDER — OXYCODONE-ACETAMINOPHEN 5-325 MG PO TABS: 1.0000 | ORAL_TABLET | Freq: Four times a day (QID) | ORAL | Status: DC | PRN

## 2014-09-10 NOTE — ED Provider Notes (Signed)
CSN: [id number]     Arrival date & time 09/10/14  [id number] History   First MD Initiated Contact with Patient 09/10/14 [phone]     Chief Complaint  Patient presents with  . Wrist Pain     (Consider location/radiation/quality/duration/timing/severity/associated sxs/prior Treatment) Patient is a 44 y.o. male presenting with wrist pain. The history is provided by the patient.  Wrist Pain   Bradley FountainMichael B Hays is a 44 y.o. male who presents to the ED with bilateral wrist pain. He had an injury in October that required bilateral wrist surgery and he has plates in both wrists. 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 He worked about 3 hours and by the end of that time was having wrist pain. Last night he had difficulty sleeping due to the pain. He had one oxycodone left over from his surgery and took it. He continues to have pain and has no more medication. He does not have transportation to see Dr. Amanda PeaGramig until next week.   Past Medical History  Diagnosis Date  . Arteriosclerotic cardiovascular disease (ASCVD) 11/10/09    Rothbart-BMS to circumflex posterior acute MI in the setting of cocaine use, peak CK of 3590, MB of 511 and troponin of 48; posterolateral hypokinesis with EF of 50% and total obstruction of circumflex; [facility] in 09/2010 40 chest pain, d-dimer of 1.57 with negative CT; EF of 45% with negative stress echo  . Hypertension   . Hyperlipidemia     lipid profile in 2011:185, 169, 37, 114; 2012: Total cholesterol of 311 and triglycerides of 1730  . Tobacco abuse     40 pack years; 0.5 PPD  . Gastroesophageal reflux disease   . Cocaine abuse     remote, quit 2010  . 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 MI, old    Past Surgical History  Procedure Laterality Date  . Appendectomy  2009  . Knee arthroscopy    . Coronary stent placement  2011    bare metal  . Nissen  fundoplication  1992  . Orif wrist fracture Bilateral 04/02/2014    Procedure: OPEN REDUCTION INTERNAL FIXATION (ORIF) WRIST FRACTURE AND CARPAL TUNNEL RELEASES;  Surgeon: Dominica SeverinWilliam Gramig, MD;  Location: [facility] OR;  Service: Orthopedics;  Laterality: Bilateral;  . Eye surgery     Family History  Problem Relation Age of Onset  . Heart disease    . Arthritis    . Lung disease    . Cancer    . Asthma    . Kidney disease     History  Substance Use Topics  . Smoking status: Current Every Day Smoker -- 0.50 packs/day for 26 years    Types: Cigarettes  . Smokeless tobacco: Never Used  . Alcohol Use: Yes     Comment: occasionally     Review of Systems Negative except as stated in HPI   Allergies  Review of patient's allergies indicates no known allergies.  Home Medications   Prior to Admission medications   Medication Sig Start Date End Date Taking? Authorizing Provider  ALPRAZolam Prudy Feeler(XANAX) 0.5 MG tablet Take 1 tablet (0.5 mg total) by mouth 2 (two) times daily. Patient not taking: Reported on 06/05/2014 04/04/14   Dominica SeverinWilliam Gramig, MD  aspirin EC 81 MG tablet Take 81 mg by mouth daily.    Historical Provider, MD  HYDROmorphone (DILAUDID) 2 MG tablet Take 1 tablet (2 mg total) by mouth every 4 (four) hours as needed for severe pain. Patient not taking: Reported on 06/05/2014 04/04/14   Dominica Severin, MD  lisinopril (PRINIVIL,ZESTRIL) 10 MG tablet Take 1 tablet (10 mg total) by mouth daily. Patient not taking: Reported on 06/05/2014 04/04/14   Dominica Severin, MD  metoprolol tartrate (LOPRESSOR) 25 MG tablet Take 1 tablet (25 mg total) by mouth 2 (two) times daily. Patient not taking: Reported on 06/05/2014 04/04/14   Dominica Severin, MD  nitroGLYCERIN (NITROSTAT) 0.4 MG SL tablet Place 0.4 mg under the tongue every 5 (five) minutes x 3 doses as needed. Chest pain.    Historical Provider, MD  oxyCODONE-acetaminophen (ROXICET) 5-325 MG per tablet Take 1 tablet by mouth every 6 (six) hours as  needed for severe pain. 09/10/14   Hope Orlene Och, NP  simvastatin (ZOCOR) 20 MG tablet Take 1 tablet (20 mg total) by mouth daily at 6 PM. Patient not taking: Reported on 06/05/2014 04/04/14   Dominica Severin, MD   BP 182/91 mmHg  Pulse 95  Temp(Src) 97.7 F (36.5 C) (Oral)  Resp 17  Ht  (1.778 m)  Wt 220 lb (99.791 kg)  BMI 31.57 kg/m2  SpO2 100% Physical Exam  Constitutional: He is oriented to person, place, and time. He appears well-developed and well-nourished.  HENT:  Head: Normocephalic.  Eyes: Conjunctivae and EOM are normal.  Neck: Neck supple.  Cardiovascular: Normal rate.   Pulmonary/Chest: Effort normal.  Musculoskeletal:  Bilateral wrists with pain during range of motion and palpation. Radial pulses equal and 2+, adequate circulation, good touch sensation. Grips equal.   Neurological: He is alert and oriented to person, place, and time. No cranial nerve deficit or sensory deficit.  Skin: Skin is warm and dry.  Psychiatric: He has a normal mood and affect. His behavior is normal.  Nursing note and vitals reviewed.   ED Course  Procedures (including critical care time) Labs Review   MDM  44 y.o. male with bilateral wrists pain. Stable for d/c without neurovascular deficits. Discussed with the patient that he will need to see Dr. Amanda Pea as soon as possible. We will give a limited number of pain medication until he can get follow up. Discussed with the patient and all questioned fully answered. He agrees with plan.  Final diagnoses:  Bilateral wrist pain      Janne Napoleon, NP 09/10/14 4098  Benjiman Core, MD 09/10/14 [phone]

## 2014-09-10 NOTE — Discharge Instructions (Signed)
We are giving you a limited number of pain medication until you can see Dr. Amanda PeaGramig. Call today to schedued follow up. Continue to take the ibuprofen as directed.

## 2014-09-10 NOTE — ED Notes (Signed)
Bilateral wrist pain. Pt states hx of titanium plates to both wrists after having both shattered an accident, Oct 8. Pain began yesterday around 1600 after hanging sheet rock. Taking ibuprofen for pain with no relief. States severe nausea due to pain but unable to vomit due to previous abdominal surgery

## 2014-09-10 NOTE — ED Notes (Signed)
Pt alert & oriented x4, stable gait. Patient given discharge instructions, paperwork & prescription(s). Patient  instructed to stop at the registration desk to finish any additional paperwork. Patient verbalized understanding. Pt left department w/ no further questions. 

## 2014-09-12 ENCOUNTER — Encounter (HOSPITAL_COMMUNITY): Payer: Self-pay | Admitting: *Deleted

## 2014-09-12 ENCOUNTER — Emergency Department (HOSPITAL_COMMUNITY)
Admission: EM | Admit: 2014-09-12 | Discharge: 2014-09-12 | Disposition: A | Discharge status: Home / Self Care | Payer: Medicaid Other | Attending: Emergency Medicine | Admitting: Emergency Medicine

## 2014-09-12 ENCOUNTER — Emergency Department (HOSPITAL_COMMUNITY): Payer: Self-pay

## 2014-09-12 DIAGNOSIS — Z720 Tobacco use: Secondary | ICD-10-CM | POA: Insufficient documentation

## 2014-09-12 DIAGNOSIS — M25532 Pain in left wrist: Secondary | ICD-10-CM

## 2014-09-12 DIAGNOSIS — I252 Old myocardial infarction: Secondary | ICD-10-CM | POA: Insufficient documentation

## 2014-09-12 DIAGNOSIS — S6992XA Unspecified injury of left wrist, hand and finger(s), initial encounter: Secondary | ICD-10-CM | POA: Insufficient documentation

## 2014-09-12 DIAGNOSIS — Y998 Other external cause status: Secondary | ICD-10-CM | POA: Insufficient documentation

## 2014-09-12 DIAGNOSIS — Z79899 Other long term (current) drug therapy: Secondary | ICD-10-CM | POA: Insufficient documentation

## 2014-09-12 DIAGNOSIS — W010XXA Fall on same level from slipping, tripping and stumbling without subsequent striking against object, initial encounter: Secondary | ICD-10-CM | POA: Insufficient documentation

## 2014-09-12 DIAGNOSIS — S8991XA Unspecified injury of right lower leg, initial encounter: Secondary | ICD-10-CM | POA: Insufficient documentation

## 2014-09-12 DIAGNOSIS — Z7982 Long term (current) use of aspirin: Secondary | ICD-10-CM | POA: Insufficient documentation

## 2014-09-12 DIAGNOSIS — S8992XA Unspecified injury of left lower leg, initial encounter: Secondary | ICD-10-CM | POA: Insufficient documentation

## 2014-09-12 DIAGNOSIS — Y9389 Activity, other specified: Secondary | ICD-10-CM | POA: Insufficient documentation

## 2014-09-12 DIAGNOSIS — T148 Other injury of unspecified body region: Secondary | ICD-10-CM | POA: Insufficient documentation

## 2014-09-12 DIAGNOSIS — Z8781 Personal history of (healed) traumatic fracture: Secondary | ICD-10-CM | POA: Insufficient documentation

## 2014-09-12 DIAGNOSIS — E785 Hyperlipidemia, unspecified: Secondary | ICD-10-CM | POA: Insufficient documentation

## 2014-09-12 DIAGNOSIS — Z9861 Coronary angioplasty status: Secondary | ICD-10-CM | POA: Insufficient documentation

## 2014-09-12 DIAGNOSIS — I10 Essential (primary) hypertension: Secondary | ICD-10-CM | POA: Insufficient documentation

## 2014-09-12 DIAGNOSIS — Z8719 Personal history of other diseases of the digestive system: Secondary | ICD-10-CM | POA: Insufficient documentation

## 2014-09-12 DIAGNOSIS — Y9289 Other specified places as the place of occurrence of the external cause: Secondary | ICD-10-CM | POA: Insufficient documentation

## 2014-09-12 NOTE — Discharge Instructions (Signed)
Apply ice to your wrist and knees. Follow-up with Dr. Amanda PeaGramig in one week if your pain continues.  Wrist Pain Wrist injuries are frequent in adults and children. A sprain is an injury to the ligaments that hold your bones together. A strain is an injury to muscle or muscle cord-like structures (tendons) from stretching or pulling. Generally, when wrists are moderately tender to touch following a fall or injury, a break in the bone (fracture) may be present. Most wrist sprains or strains are better in 3 to 5 days, but complete healing may take several weeks. HOME CARE INSTRUCTIONS   Put ice on the injured area.  Put ice in a plastic bag.  Place a towel between your skin and the bag.  Leave the ice on for 15-20 minutes, 3-4 times a day, for the first 2 days, or as directed by your health care provider.  Keep your arm raised above the level of your heart whenever possible to reduce swelling and pain.  Rest the injured area for at least 48 hours or as directed by your health care provider.  If a splint or elastic bandage has been applied, use it for as long as directed by your health care provider or until seen by a health care provider for a follow-up exam.  Only take over-the-counter or prescription medicines for pain, discomfort, or fever as directed by your health care provider.  Keep all follow-up appointments. You may need to follow up with a specialist or have follow-up X-rays. Improvement in pain level is not a guarantee that you did not fracture a bone in your wrist. The only way to determine whether or not you have a broken bone is by X-ray. SEEK IMMEDIATE MEDICAL CARE IF:   Your fingers are swollen, very red, white, or cold and blue.  Your fingers are numb or tingling.  You have increasing pain.  You have difficulty moving your fingers. MAKE SURE YOU:   Understand these instructions.  Will watch your condition.  Will get help right away if you are not doing well or get  worse. Document Released: 03/22/2005 Document Revised: 06/17/2013 Document Reviewed: 08/03/2010 [facility] Patient Information 2015 [employer] [place]. This information is not intended to replace advice given to you by your health care provider. Make sure you discuss any questions you have with your health care provider.

## 2014-09-12 NOTE — ED Provider Notes (Signed)
CSN: [id number]     Arrival date & time 09/12/14  1617 History  This chart was scribed for Bradley Skeenobyn Sarrah Fiorenza, PA-C working with No att. providers found by Elveria Risingimelie Horne, ED Scribe. This patient was seen in room TR09C/TR09C and the patient's care was started at 6:28 PM.   Chief Complaint  Patient presents with  . Wrist Injury   HPI HPI Comments: Bradley Hays is a 44 y.o. male who presents to the Emergency Department with multiple complaints incurred due to fall in ravine yesterday while chasing his dog. Patient reports injuries to left wrist, bilateral knees and lower legs. Patient presents with abrasions to his extremities from his fall. Patient most concerned about his left wrist injury. Patient reports reduced grip strength due to pain severity and pain with applied pressured. Patient shares that he has an old splint at home. Patient evaluated at 09/10/2014 two days ago for bilateral wrist pain after hanging sheet rock at his home; patient discharged with Oxycodone. Patient informed to visit his surgeon Dr. Amanda PeaGramig as soon as possible. Patient's surgical history includes knee arthroscopy and bilateral ORIF wrist fracture and carpel tunnel repair in 03/2014.  Past Medical History  Diagnosis Date  . Arteriosclerotic cardiovascular disease (ASCVD) 11/10/09    Rothbart-BMS to circumflex posterior acute MI in the setting of cocaine use, peak CK of 3590, MB of 511 and troponin of 48; posterolateral hypokinesis with EF of 50% and total obstruction of circumflex; [facility] in 09/2010 40 chest pain, d-dimer of 1.57 with negative CT; EF of 45% with negative stress echo  . Hypertension   . Hyperlipidemia     lipid profile in 2011:185, 169, 37, 114; 2012: Total cholesterol of 311 and triglycerides of 1730  . Tobacco abuse     40 pack years; 0.5 PPD  . Gastroesophageal reflux disease   . Cocaine abuse     remote, quit 2010  . 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 MI, old    Past  Surgical History  Procedure Laterality Date  . Appendectomy  2009  . Knee arthroscopy    . Coronary stent placement  2011    bare metal  . Nissen fundoplication  1992  . Orif wrist fracture Bilateral 04/02/2014    Procedure: OPEN REDUCTION INTERNAL FIXATION (ORIF) WRIST FRACTURE AND CARPAL TUNNEL RELEASES;  Surgeon: Dominica SeverinWilliam Gramig, MD;  Location: [facility] OR;  Service: Orthopedics;  Laterality: Bilateral;  . Eye surgery     Family History  Problem Relation Age of Onset  . Heart disease    . Arthritis    . Lung disease    . Cancer    . Asthma    . Kidney disease     History  Substance Use Topics  . Smoking status: Current Every Day Smoker -- 0.50 packs/day for 26 years    Types: Cigarettes  . Smokeless tobacco: Never Used  . Alcohol Use: Yes     Comment: occasionally     Review of Systems  10 Systems reviewed and are negative for acute change except as noted in the HPI.  Allergies  Review of patient's allergies indicates no known allergies.  Home Medications   Prior to Admission medications   Medication Sig Start Date End Date Taking? Authorizing Provider  ALPRAZolam Prudy Feeler(XANAX) 0.5 MG tablet Take 1 tablet (0.5 mg total) by mouth 2 (two) times daily. Patient not taking: Reported on 06/05/2014 04/04/14   Dominica SeverinWilliam Gramig, MD  aspirin  EC 81 MG tablet Take 81 mg by mouth daily.    Historical Provider, MD  HYDROmorphone (DILAUDID) 2 MG tablet Take 1 tablet (2 mg total) by mouth every 4 (four) hours as needed for severe pain. Patient not taking: Reported on 06/05/2014 04/04/14   Dominica Severin, MD  lisinopril (PRINIVIL,ZESTRIL) 10 MG tablet Take 1 tablet (10 mg total) by mouth daily. Patient not taking: Reported on 06/05/2014 04/04/14   Dominica Severin, MD  metoprolol tartrate (LOPRESSOR) 25 MG tablet Take 1 tablet (25 mg total) by mouth 2 (two) times daily. Patient not taking: Reported on 06/05/2014 04/04/14   Dominica Severin, MD  nitroGLYCERIN (NITROSTAT) 0.4 MG SL tablet Place 0.4 mg  under the tongue every 5 (five) minutes x 3 doses as needed. Chest pain.    Historical Provider, MD  oxyCODONE-acetaminophen (ROXICET) 5-325 MG per tablet Take 1 tablet by mouth every 6 (six) hours as needed for severe pain. 09/10/14   Hope Orlene Och, NP  simvastatin (ZOCOR) 20 MG tablet Take 1 tablet (20 mg total) by mouth daily at 6 PM. Patient not taking: Reported on 06/05/2014 04/04/14   Dominica Severin, MD   Triage Vitals: BP 143/97 mmHg  Pulse 106  Temp(Src) 98.2 F (36.8 C) (Oral)  Resp 18  SpO2 98% Physical Exam  Constitutional: He is oriented to person, place, and time. He appears well-developed and well-nourished. No distress.  HENT:  Head: Normocephalic and atraumatic.  Eyes: Conjunctivae and EOM are normal.  Neck: Normal range of motion. Neck supple.  Cardiovascular: Normal rate, regular rhythm and normal heart sounds.   Pulmonary/Chest: Effort normal and breath sounds normal.  Musculoskeletal: He exhibits no edema.  Left wrist tender to palpation over carpal bones. No snuffbox tenderness. No swelling or deformity. Range of motion limited by pain. Hand normal. FROM. Cap refill < 3 seconds. Mild tenderness to bilateral knees. No swelling or deformity. Full range of motion.  Neurological: He is alert and oriented to person, place, and time.  Skin: Skin is warm and dry.  Multiple superficial scrapes all over body. Many of these do not appear acute.  Psychiatric: He has a normal mood and affect. His behavior is normal.  Nursing note and vitals reviewed.   ED Course  Procedures (including critical care time)  COORDINATION OF CARE: 6:22 PM- Patient advised to follow up with Gramig and ice his wrist at home. Discussed treatment plan with patient at bedside and patient agreed to plan.   Labs Review Labs Reviewed - No data to display  Imaging Review Dg Wrist Complete Left  09/12/2014   CLINICAL DATA:  Left wrist injury yesterday with wrist pain. Initial encounter.  EXAM: LEFT  WRIST - COMPLETE 3+ VIEW  COMPARISON:  04/02/2014  FINDINGS: There is no evidence of acute fracture, subluxation or dislocation.  Internal plate and screw fixation traversing a healed distal radial fracture is noted.  A remote ulnar styloid fracture is noted.  There is no evidence of subluxation or dislocation.  IMPRESSION: No acute abnormality.   Electronically Signed   By: Harmon Pier M.D.   On: 09/12/2014 18:07     EKG Interpretation None      MDM   Final diagnoses:  Fall from slip, trip, or stumble, initial encounter  Left wrist pain   NAD. Neurovascularly intact. X-ray without acute finding. Seen 2 days ago as stated above with wrist pain from a different injury. No swelling, bruising or deformity. Multiple scrapes all over body, many of which  appear old. Advised him to use the splint that he has at home and follow-up with Dr. grim ache if no improvement in one week. RICE, NSAIDs. Stable for d/c. Return precautions given. Patient states understanding of treatment care plan and is agreeable.  I personally performed the services described in this documentation, which was scribed in my presence. The recorded information has been reviewed and is accurate.   Kathrynn Speed, PA-C 09/12/14 1948  Linwood Dibbles, MD 09/13/14 [phone]

## 2014-09-12 NOTE — ED Notes (Signed)
The pt fell into a ravine yesterday injuring his lt wrist both knees and lower extremities.  He had no ride to come here yesterday.  He has extreme opain lt wrist  He has a rod in thge wrist that dr Amanda Peagramig placed in October.  He also has pain in both knees and lower extremities with scratches abrasions and small cuts

## 2014-09-25 ENCOUNTER — Emergency Department (HOSPITAL_COMMUNITY): Payer: Medicaid Other

## 2014-09-25 ENCOUNTER — Encounter (HOSPITAL_COMMUNITY): Payer: Self-pay

## 2014-09-25 ENCOUNTER — Emergency Department (HOSPITAL_COMMUNITY)
Admission: EM | Admit: 2014-09-25 | Discharge: 2014-09-26 | Discharge status: Against Medical Advice | Payer: Medicaid Other | Attending: Emergency Medicine | Admitting: Emergency Medicine

## 2014-09-25 DIAGNOSIS — Y998 Other external cause status: Secondary | ICD-10-CM | POA: Insufficient documentation

## 2014-09-25 DIAGNOSIS — I252 Old myocardial infarction: Secondary | ICD-10-CM | POA: Insufficient documentation

## 2014-09-25 DIAGNOSIS — Z79899 Other long term (current) drug therapy: Secondary | ICD-10-CM | POA: Insufficient documentation

## 2014-09-25 DIAGNOSIS — S0990XA Unspecified injury of head, initial encounter: Secondary | ICD-10-CM

## 2014-09-25 DIAGNOSIS — Z955 Presence of coronary angioplasty implant and graft: Secondary | ICD-10-CM | POA: Insufficient documentation

## 2014-09-25 DIAGNOSIS — I10 Essential (primary) hypertension: Secondary | ICD-10-CM | POA: Insufficient documentation

## 2014-09-25 DIAGNOSIS — Z9889 Other specified postprocedural states: Secondary | ICD-10-CM | POA: Insufficient documentation

## 2014-09-25 DIAGNOSIS — Y9289 Other specified places as the place of occurrence of the external cause: Secondary | ICD-10-CM | POA: Insufficient documentation

## 2014-09-25 DIAGNOSIS — Z8719 Personal history of other diseases of the digestive system: Secondary | ICD-10-CM | POA: Insufficient documentation

## 2014-09-25 DIAGNOSIS — Y9389 Activity, other specified: Secondary | ICD-10-CM | POA: Insufficient documentation

## 2014-09-25 DIAGNOSIS — I209 Angina pectoris, unspecified: Secondary | ICD-10-CM

## 2014-09-25 DIAGNOSIS — Z720 Tobacco use: Secondary | ICD-10-CM | POA: Insufficient documentation

## 2014-09-25 DIAGNOSIS — I25119 Atherosclerotic heart disease of native coronary artery with unspecified angina pectoris: Secondary | ICD-10-CM | POA: Insufficient documentation

## 2014-09-25 DIAGNOSIS — R55 Syncope and collapse: Secondary | ICD-10-CM | POA: Insufficient documentation

## 2014-09-25 DIAGNOSIS — Z7982 Long term (current) use of aspirin: Secondary | ICD-10-CM | POA: Insufficient documentation

## 2014-09-25 DIAGNOSIS — E785 Hyperlipidemia, unspecified: Secondary | ICD-10-CM | POA: Insufficient documentation

## 2014-09-25 DIAGNOSIS — Z87828 Personal history of other (healed) physical injury and trauma: Secondary | ICD-10-CM | POA: Insufficient documentation

## 2014-09-25 DIAGNOSIS — W01198A Fall on same level from slipping, tripping and stumbling with subsequent striking against other object, initial encounter: Secondary | ICD-10-CM | POA: Insufficient documentation

## 2014-09-25 MED ORDER — NITROGLYCERIN 0.4 MG SL SUBL
0.4000 mg | SUBLINGUAL_TABLET | SUBLINGUAL | Status: AC | PRN
Start: 2014-09-25 — End: 2014-09-26
  Administered 2014-09-25 – 2014-09-26 (×3): 0.4 mg via SUBLINGUAL
  Filled 2014-09-25: qty 1

## 2014-09-25 MED ORDER — ASPIRIN 81 MG PO CHEW
324.0000 mg | CHEWABLE_TABLET | Freq: Once | ORAL | Status: AC
  Administered 2014-09-25: 324 mg via ORAL
  Filled 2014-09-25: qty 4

## 2014-09-25 NOTE — ED Provider Notes (Signed)
CSN: [id number]     Arrival date & time 09/25/14  2306 History  This chart was scribed for Zadie Rhine, MD by Tanda Rockers, ED Scribe. This patient was seen in room APA02/APA02 and the patient's care was started at 11:41 PM.     Chief Complaint  Patient presents with  . Chest Pain   Patient is a 44 y.o. male presenting with chest pain. The history is provided by the patient and the spouse. No language interpreter was used.  Chest Pain Pain location:  Substernal area Pain quality: tightness   Pain radiates to:  Does not radiate Pain radiates to the back: no   Onset quality:  Sudden Duration:  1 day Timing:  Constant Progression:  Waxing and waning Chronicity:  New Associated symptoms: shortness of breath   Associated symptoms: no diaphoresis, no nausea and not vomiting      HPI Comments: Bradley Hays is a 45 y.o. male with PMHx CAD, HTN, HLD, GERD, MI who presents to the Emergency Department complaining of waxing and waning substernal chest pain that began 1 day ago. Pt describes it as a tightness. He states that the pain lasts approximately 20-30 minutes. He mentions that exertion exacerbates his pain. He also complains of shortness of breath. He reports that tonight he was eating when the pain was exacerbated. He states that he had syncopal episode shortly afterwards and remembers waking up on the floor. Pt hit the back of his head and complains of a headache. Pt's chest feels heavier since the syncope. He also complains of "heavy" eyes. No nausea, vomiting, diaphoresis, painful leg swelling, or any other symptoms.   Cardiologist - None (previously saw Dr Dietrich Pates)   Past Medical History  Diagnosis Date  . Arteriosclerotic cardiovascular disease (ASCVD) 11/10/09    Rothbart-BMS to circumflex posterior acute MI in the setting of cocaine use, peak CK of 3590, MB of 511 and troponin of 48; posterolateral hypokinesis with EF of 50% and total obstruction of circumflex; [facility] in 09/2010 40 chest pain, d-dimer of 1.57 with negative CT; EF of 45% with negative stress echo  . Hypertension   . Hyperlipidemia     lipid profile in 2011:185, 169, 37, 114; 2012: Total cholesterol of 311 and triglycerides of 1730  . Tobacco abuse     40 pack years; 0.5 PPD  . Gastroesophageal reflux disease   . Cocaine abuse     remote, quit 2010  . 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 MI, old    Past Surgical History  Procedure Laterality Date  . Appendectomy  2009  . Knee arthroscopy    . Coronary stent placement  2011    bare metal  . Nissen fundoplication  1992  . Orif wrist fracture Bilateral 04/02/2014    Procedure: OPEN REDUCTION INTERNAL FIXATION (ORIF) WRIST FRACTURE AND CARPAL TUNNEL RELEASES;  Surgeon: Dominica Severin, MD;  Location: [facility] OR;  Service: Orthopedics;  Laterality: Bilateral;  . Eye surgery     Family History  Problem Relation Age of Onset  . Heart disease    . Arthritis    . Lung disease    . Cancer    . Asthma    . Kidney disease     History  Substance Use Topics  . Smoking status: Current Every Day Smoker -- 0.50 packs/day for 26 years    Types: Cigarettes  . Smokeless tobacco: Never Used  . Alcohol Use:  Yes     Comment: occasionally     Review of Systems  Constitutional: Negative for diaphoresis.  Respiratory: Positive for shortness of breath.   Cardiovascular: Positive for chest pain. Negative for leg swelling.  Gastrointestinal: Negative for nausea and vomiting.  All other systems reviewed and are negative.     Allergies  Review of patient's allergies indicates no known allergies.  Home Medications   Prior to Admission medications   Medication Sig Start Date End Date Taking? Authorizing Provider  ALPRAZolam Prudy Feeler(XANAX) 0.5 MG tablet Take 1 tablet (0.5 mg total) by mouth 2 (two) times daily. Patient not taking: Reported on 06/05/2014 04/04/14   Dominica SeverinWilliam Gramig, MD  aspirin EC 81 MG tablet Take 81 mg by mouth  daily.    Historical Provider, MD  HYDROmorphone (DILAUDID) 2 MG tablet Take 1 tablet (2 mg total) by mouth every 4 (four) hours as needed for severe pain. Patient not taking: Reported on 06/05/2014 04/04/14   Dominica SeverinWilliam Gramig, MD  lisinopril (PRINIVIL,ZESTRIL) 10 MG tablet Take 1 tablet (10 mg total) by mouth daily. Patient not taking: Reported on 06/05/2014 04/04/14   Dominica SeverinWilliam Gramig, MD  metoprolol tartrate (LOPRESSOR) 25 MG tablet Take 1 tablet (25 mg total) by mouth 2 (two) times daily. Patient not taking: Reported on 06/05/2014 04/04/14   Dominica SeverinWilliam Gramig, MD  nitroGLYCERIN (NITROSTAT) 0.4 MG SL tablet Place 0.4 mg under the tongue every 5 (five) minutes x 3 doses as needed. Chest pain.    Historical Provider, MD  oxyCODONE-acetaminophen (ROXICET) 5-325 MG per tablet Take 1 tablet by mouth every 6 (six) hours as needed for severe pain. 09/10/14   Hope Orlene OchM Neese, NP  simvastatin (ZOCOR) 20 MG tablet Take 1 tablet (20 mg total) by mouth daily at 6 PM. Patient not taking: Reported on 06/05/2014 04/04/14   Dominica SeverinWilliam Gramig, MD   Triage Vitals: BP 159/108 mmHg  Pulse 89  Temp(Src) 98.3 F (36.8 C) (Oral)  Resp 16  Ht 5\' 10"  (1.778 m)  Wt 220 lb (99.791 kg)  BMI 31.57 kg/m2  SpO2 97%   Physical Exam  CONSTITUTIONAL: Well developed/well nourished HEAD: Normocephalic/atraumatic. No signs of trauma EYES: EOMI/PERRL ENMT: Mucous membranes moist NECK: supple no meningeal signs SPINE/BACK:entire spine nontender, No bruising/crepitance/stepoffs noted to spine CV: S1/S2 noted, no murmurs/rubs/gallops noted LUNGS: Lungs are clear to auscultation bilaterally, no apparent distress ABDOMEN: soft, nontender, no rebound or guarding, bowel sounds noted throughout abdomen GU:no cva tenderness NEURO: Pt is awake/alert/appropriate, moves all extremitiesx4.  No facial droop.   EXTREMITIES: pulses normal/equal, full ROM. No edema or calf tenderness.  SKIN: warm, color normal PSYCH: no abnormalities of mood  noted, alert and oriented to situation  ED Course  Procedures   DIAGNOSTIC STUDIES: Oxygen Saturation is 97% on RA, normal by my interpretation.    COORDINATION OF CARE: 11:47 PM-Discussed treatment plan which includes CXR, BMP, CBC, Troponinwith pt at bedside and pt agreed to plan.  Pt with h/o CAD previously but lost to cardiology followup He reports chest tightness with syncope Will need admission Denies pleuritic pain.  I doubt PE at this time He reports hitting his head but no signs of trauma to scalp, no neuro deficits, defer neuro imaging for now 1:02 AM Pt reports he feels improved though still has CP He wants to go home I advised that given h/o CAD with recent CP/syncope he needs to be admitted for further evaluation Pt would like to go home and f/u as outpatient I advised him that  there is a possibility this could get worse and lead to death/disability He understands this and accepts risk Wife at bedside, discussed need to encourage patient to come back at anytime for admission  I discussed risk of death/disability of leaving against medical advice and the patient accepts these risks.  The patient is awake/alert able to make decisions, and does not appear intoxicated Patient discharged against medical advice.    Labs Review Labs Reviewed  BASIC METABOLIC PANEL - Abnormal; Notable for the following:    Glucose, Bld 107 (*)    Calcium 8.3 (*)    All other components within normal limits  CBC WITH DIFFERENTIAL/PLATELET - Abnormal; Notable for the following:    Monocytes Absolute 1.1 (*)    All other components within normal limits  I-STAT TROPOININ, ED    Imaging Review Dg Chest Portable 1 View  09/26/2014   CLINICAL DATA:  Acute onset of chest pressure and tightness. Shortness of breath. Syncope. Headache. Initial encounter.  EXAM: PORTABLE CHEST - 1 VIEW  COMPARISON:  Chest radiograph and CT of the chest performed 04/02/2014  FINDINGS: The lungs are well-aerated and  clear. There is no evidence of focal opacification, pleural effusion or pneumothorax.  The cardiomediastinal silhouette is within normal limits. No acute osseous abnormalities are seen.  IMPRESSION: No acute cardiopulmonary process seen.   Electronically Signed   By: Roanna Raider M.D.   On: 09/26/2014 00:53     EKG Interpretation   Date/Time:  Friday September 25 2014 23:17:24 EDT Ventricular Rate:  88 PR Interval:  142 QRS Duration: 112 QT Interval:  371 QTC Calculation: 449 R Axis:   67 Text Interpretation:  Sinus rhythm Incomplete right bundle branch block  incomplete RBBB seen in prior Abnormal ekg Confirmed by Bebe Shaggy  MD,  Dorinda Hill ([id number]) on 09/26/2014 12:06:52 AM     Medications  aspirin chewable tablet 324 mg (324 mg Oral Given 09/25/14 2355)  nitroGLYCERIN (NITROSTAT) SL tablet 0.4 mg (0.4 mg Sublingual Given 09/26/14 0027)    MDM   Final diagnoses:  Ischemic chest pain  Syncope, unspecified syncope type  Minor head injury, initial encounter    Nursing notes including past medical history and social history reviewed and considered in documentation xrays/imaging reviewed by myself and considered during evaluation Labs/vital reviewed myself and considered during evaluation Previous records reviewed and considered   I personally performed the services described in this documentation, which was scribed in my presence. The recorded information has been reviewed and is accurate.       Zadie Rhine, MD 09/26/14 [phone]

## 2014-09-25 NOTE — ED Notes (Signed)
Pt c/o chest pressure and tightness that started yesterday.  Pt states he also has a headache and his family told him he passed out this evening.  Pt has small bump to forehead where he hit his head on hot water tank

## 2014-09-26 LAB — BASIC METABOLIC PANEL
Anion gap: 7 (ref 5–15)
BUN: 15 mg/dL (ref 6–23)
CALCIUM: 8.3 mg/dL — AB (ref 8.4–10.5)
CO2: 24 mmol/L (ref 19–32)
Chloride: 105 mmol/L (ref 96–112)
Creatinine, Ser: 0.85 mg/dL (ref 0.50–1.35)
GFR calc non Af Amer: >90 mL/min (ref >90)
GLUCOSE: 107 mg/dL — AB (ref 70–99)
Potassium: 3.6 mmol/L (ref 3.5–5.1)
Sodium: 136 mmol/L (ref 135–145)

## 2014-09-26 LAB — CBC WITH DIFFERENTIAL/PLATELET
Basophils Absolute: 0 10*3/uL (ref 0.0–0.1)
Basophils Relative: 0 % (ref 0–1)
EOS ABS: 0.2 10*3/uL (ref 0.0–0.7)
EOS PCT: 2 % (ref 0–5)
HCT: 42.6 % (ref 39.0–52.0)
Hemoglobin: 15.1 g/dL (ref 13.0–17.0)
LYMPHS ABS: 2.9 10*3/uL (ref 0.7–4.0)
Lymphocytes Relative: 27 % (ref 12–46)
MCH: 32.2 pg (ref 26.0–34.0)
MCHC: 35.4 g/dL (ref 30.0–36.0)
MCV: 90.8 fL (ref 78.0–100.0)
Monocytes Absolute: 1.1 10*3/uL — ABNORMAL HIGH (ref 0.1–1.0)
Monocytes Relative: 10 % (ref 3–12)
NEUTROS ABS: 6.4 10*3/uL (ref 1.7–7.7)
Neutrophils Relative %: 61 % (ref 43–77)
Platelets: 205 10*3/uL (ref 150–400)
RBC: 4.69 MIL/uL (ref 4.22–5.81)
RDW: 13.6 % (ref 11.5–15.5)
WBC: 10.5 10*3/uL (ref 4.0–10.5)

## 2014-09-26 LAB — I-STAT TROPONIN, ED: Troponin i, poc: 0.01 ng/mL (ref 0.00–0.08)

## 2014-09-26 MED ORDER — MORPHINE SULFATE 4 MG/ML IJ SOLN: 4.0000 mg | Freq: Once | INTRAMUSCULAR | Status: DC

## 2014-09-26 NOTE — ED Notes (Signed)
Went into ask patient for urine specimen and to advise him that I was going to bring him some morphine and patient states he is feeling better and wanted to go home. EDP [place] into see patient and explained the benefits and the risk of leaving and patient is leaving AMA.

## 2014-09-26 NOTE — Discharge Instructions (Signed)
Angina Pectoris Angina pectoris is extreme discomfort in your chest, neck, or arm. Your doctor may call it just angina. It is caused by a lack of oxygen to your heart wall. It may feel like tightness or heavy pressure. It may feel like a crushing or squeezing pain. Some people say it feels like gas. It may go down your shoulders, back, and arms. Some people have symptoms other than pain. These include:  Tiredness.  Shortness of breath.  Cold sweats.  Feeling sick to your stomach (nausea). There are four types of angina:  Stable angina. This type often lasts the same amount of time each time it happens. Activity, stress, or excitement can bring it on. It often gets better after taking a medicine called nitroglycerin. This goes under your tongue.  Unstable angina. This type can happen when you are not active or even during sleep. It can suddenly get worse or happen more often. It may not get better after taking the special medicine. It can last up to 30 minutes.  Microvascular angina. This type is more common in women. It may be more severe or last longer than other types.  Prinzmetal angina. This type often happens when you are not active or in the early morning hours. HOME CARE   Only take medicines as told by your doctor.  Stay active or exercise more as told by your doctor.  Limit very hard activity as told by your doctor.  Limit heavy lifting as told by your doctor.  Keep a healthy weight.  Learn about and eat foods that are healthy for your heart.  Do not use any tobacco such as cigarettes, chewing tobacco, or e-cigarettes. GET HELP RIGHT AWAY IF:   You have chest, neck, deep shoulder, or arm pain or discomfort that lasts more than a few minutes.  You have chest, neck, deep shoulder, or arm pain or discomfort that goes away and comes back over and over again.  You have heavy sweating that seems to happen for no reason.  You have shortness of breath or trouble  breathing.  Your angina does not get better after a few minutes of rest.  Your angina does not get better after you take nitroglycerin medicine. These can all be symptoms of a heart attack. Get help right away. Call your local emergency service (911 in [place] Do not  drive yourself to the hospital. Do not  wait to for your symptoms to go away. MAKE SURE YOU:   Understand these instructions.  Will watch your condition.  Will get help right away if you are not doing well or get worse. Document Released: 11/29/2007 Document Revised: 06/17/2013 Document Reviewed: 10/14/2013 [facility] Patient Information 2015 [place], [place]. This information is not intended to replace advice given to you by your health care provider. Make sure you discuss any questions you have with your health care provider.    You have had a head injury which does not appear to require admission at this time. A concussion is a state of changed mental ability from trauma.  SEEK IMMEDIATE MEDICAL ATTENTION IF: There is confusion or drowsiness (although children frequently become drowsy after injury).  You cannot awaken the injured person.  There is nausea (feeling sick to your stomach) or continued, forceful vomiting.  You notice dizziness or unsteadiness which is getting worse, or inability to walk.  You have convulsions or unconsciousness.  You experience severe, persistent headaches not relieved by Tylenol. (Do not take aspirin as this impairs clotting  abilities). Take other pain medications only as directed.  You cannot use arms or legs normally.  There are changes in pupil sizes. (This is the black center in the colored part of the eye)  There is clear or bloody discharge from the nose or ears.  Change in speech, vision, swallowing, or understanding.  Localized weakness, numbness, tingling, or change in bowel or bladder control.

## 2014-12-24 ENCOUNTER — Emergency Department (HOSPITAL_COMMUNITY)
Admission: EM | Admit: 2014-12-24 | Discharge: 2014-12-24 | Discharge status: Against Medical Advice | Payer: Self-pay | Attending: Emergency Medicine | Admitting: Emergency Medicine

## 2014-12-24 ENCOUNTER — Encounter (HOSPITAL_COMMUNITY): Payer: Self-pay | Admitting: Emergency Medicine

## 2014-12-24 DIAGNOSIS — I10 Essential (primary) hypertension: Secondary | ICD-10-CM | POA: Insufficient documentation

## 2014-12-24 DIAGNOSIS — I252 Old myocardial infarction: Secondary | ICD-10-CM | POA: Insufficient documentation

## 2014-12-24 DIAGNOSIS — M25421 Effusion, right elbow: Secondary | ICD-10-CM | POA: Insufficient documentation

## 2014-12-24 DIAGNOSIS — I2510 Atherosclerotic heart disease of native coronary artery without angina pectoris: Secondary | ICD-10-CM | POA: Insufficient documentation

## 2014-12-24 DIAGNOSIS — Z720 Tobacco use: Secondary | ICD-10-CM | POA: Insufficient documentation

## 2014-12-24 NOTE — ED Notes (Signed)
Pain to right elbow this am about 3 am.  Rates pain 9/10.  Elbow red at this time.  Motrin 600 mg at 0300 and 2 percocet at 0900.

## 2014-12-27 ENCOUNTER — Encounter (HOSPITAL_COMMUNITY): Payer: Self-pay | Admitting: *Deleted

## 2014-12-27 ENCOUNTER — Emergency Department (HOSPITAL_COMMUNITY)
Admission: EM | Admit: 2014-12-27 | Discharge: 2014-12-27 | Disposition: A | Discharge status: Home / Self Care | Payer: Self-pay | Attending: Emergency Medicine | Admitting: Emergency Medicine

## 2014-12-27 ENCOUNTER — Emergency Department (HOSPITAL_COMMUNITY): Payer: Self-pay

## 2014-12-27 DIAGNOSIS — Z79899 Other long term (current) drug therapy: Secondary | ICD-10-CM | POA: Insufficient documentation

## 2014-12-27 DIAGNOSIS — M7031 Other bursitis of elbow, right elbow: Secondary | ICD-10-CM | POA: Insufficient documentation

## 2014-12-27 DIAGNOSIS — Z9861 Coronary angioplasty status: Secondary | ICD-10-CM | POA: Insufficient documentation

## 2014-12-27 DIAGNOSIS — Z87828 Personal history of other (healed) physical injury and trauma: Secondary | ICD-10-CM | POA: Insufficient documentation

## 2014-12-27 DIAGNOSIS — I252 Old myocardial infarction: Secondary | ICD-10-CM | POA: Insufficient documentation

## 2014-12-27 DIAGNOSIS — Z7982 Long term (current) use of aspirin: Secondary | ICD-10-CM | POA: Insufficient documentation

## 2014-12-27 DIAGNOSIS — Z8719 Personal history of other diseases of the digestive system: Secondary | ICD-10-CM | POA: Insufficient documentation

## 2014-12-27 DIAGNOSIS — E785 Hyperlipidemia, unspecified: Secondary | ICD-10-CM | POA: Insufficient documentation

## 2014-12-27 DIAGNOSIS — M7021 Olecranon bursitis, right elbow: Secondary | ICD-10-CM

## 2014-12-27 DIAGNOSIS — I10 Essential (primary) hypertension: Secondary | ICD-10-CM | POA: Insufficient documentation

## 2014-12-27 DIAGNOSIS — I2510 Atherosclerotic heart disease of native coronary artery without angina pectoris: Secondary | ICD-10-CM | POA: Insufficient documentation

## 2014-12-27 DIAGNOSIS — Z720 Tobacco use: Secondary | ICD-10-CM | POA: Insufficient documentation

## 2014-12-27 DIAGNOSIS — Y9389 Activity, other specified: Secondary | ICD-10-CM | POA: Insufficient documentation

## 2014-12-27 LAB — BASIC METABOLIC PANEL
ANION GAP: 8 (ref 5–15)
BUN: 16 mg/dL (ref 6–20)
CO2: 26 mmol/L (ref 22–32)
CREATININE: 0.91 mg/dL (ref 0.61–1.24)
Calcium: 8.7 mg/dL — ABNORMAL LOW (ref 8.9–10.3)
Chloride: 102 mmol/L (ref 101–111)
GFR calc Af Amer: >60 mL/min (ref >60)
GFR calc non Af Amer: >60 mL/min (ref >60)
GLUCOSE: 101 mg/dL — AB (ref 65–99)
POTASSIUM: 4.4 mmol/L (ref 3.5–5.1)
SODIUM: 136 mmol/L (ref 135–145)

## 2014-12-27 LAB — CBC WITH DIFFERENTIAL/PLATELET
BASOS PCT: 0 % (ref 0–1)
Basophils Absolute: 0 10*3/uL (ref 0.0–0.1)
EOS ABS: 0.1 10*3/uL (ref 0.0–0.7)
Eosinophils Relative: 1 % (ref 0–5)
HCT: 42.6 % (ref 39.0–52.0)
HEMOGLOBIN: 14.5 g/dL (ref 13.0–17.0)
Lymphocytes Relative: 18 % (ref 12–46)
Lymphs Abs: 1.9 10*3/uL (ref 0.7–4.0)
MCH: 31.3 pg (ref 26.0–34.0)
MCHC: 34 g/dL (ref 30.0–36.0)
MCV: 92 fL (ref 78.0–100.0)
MONO ABS: 1 10*3/uL (ref 0.1–1.0)
MONOS PCT: 9 % (ref 3–12)
NEUTROS ABS: 7.6 10*3/uL (ref 1.7–7.7)
NEUTROS PCT: 72 % (ref 43–77)
Platelets: 188 10*3/uL (ref 150–400)
RBC: 4.63 MIL/uL (ref 4.22–5.81)
RDW: 13.4 % (ref 11.5–15.5)
WBC: 10.6 10*3/uL — AB (ref 4.0–10.5)

## 2014-12-27 MED ORDER — OXYCODONE-ACETAMINOPHEN 5-325 MG PO TABS
2.0000 | ORAL_TABLET | Freq: Once | ORAL | Status: AC
  Administered 2014-12-27: 2 via ORAL
  Filled 2014-12-27: qty 2

## 2014-12-27 MED ORDER — IBUPROFEN 800 MG PO TABS: 800.0000 mg | ORAL_TABLET | Freq: Three times a day (TID) | ORAL | Status: DC

## 2014-12-27 MED ORDER — SULFAMETHOXAZOLE-TRIMETHOPRIM 800-160 MG PO TABS: 1.0000 | ORAL_TABLET | Freq: Two times a day (BID) | ORAL | Status: AC

## 2014-12-27 MED ORDER — OXYCODONE-ACETAMINOPHEN 5-325 MG PO TABS: 1.0000 | ORAL_TABLET | ORAL | Status: DC | PRN
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
## 2014-12-27 NOTE — ED Notes (Signed)
Pt woke up on Thursday with pain, swelling, redness. Area is warm to the touch today. Pt states he is unable to straighten arm.
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
## 2014-12-27 NOTE — Discharge Instructions (Signed)
Bursitis °Bursitis is when the fluid-filled sac (bursa) that covers and protects a joint gets puffy and irritated. The elbow, shoulder, hip, and knee joints are most often affected. °HOME CARE °· Put ice on the area. °¨ Put ice in a plastic bag. °¨ Place a towel between your skin and the bag. °¨ Leave the ice on for 15-20 minutes, 03-04 times a day. °· Put the joint through a full range of motion 4 times a day. Rest the injured joint at other times. When you have less pain, begin slow movements and usual activities. °· Only take medicine as told by your doctor. °· Follow up with your doctor. Any delay in care could stop the bursitis from healing. This could cause long-term pain. °GET HELP RIGHT AWAY IF:  °· You have more pain with treatment. °· You have a temperature by mouth above 102° F (38.9° C), not controlled by medicine. °· You have heat and irritation over the fluid-filled sac. °MAKE SURE YOU:  °· Understand these instructions. °· Will watch your condition. °· Will get help right away if you are not doing well or get worse. °Document Released: 11/30/2009 Document Revised: 09/04/2011 Document Reviewed: 09/01/2013 °[employer]® Patient Information ©2015 [employer]. This information is not intended to replace advice given to you by your health care provider. Make sure you discuss any questions you have with your health care provider. ° °

## 2015-02-06 ENCOUNTER — Emergency Department (HOSPITAL_COMMUNITY): Payer: Self-pay

## 2015-02-06 ENCOUNTER — Encounter (HOSPITAL_COMMUNITY): Payer: Self-pay | Admitting: *Deleted

## 2015-02-06 ENCOUNTER — Emergency Department (HOSPITAL_COMMUNITY)
Admission: EM | Admit: 2015-02-06 | Discharge: 2015-02-06 | Disposition: A | Discharge status: Home / Self Care | Payer: Self-pay | Attending: Emergency Medicine | Admitting: Emergency Medicine

## 2015-02-06 DIAGNOSIS — S60222A Contusion of left hand, initial encounter: Secondary | ICD-10-CM | POA: Insufficient documentation

## 2015-02-06 DIAGNOSIS — Z8719 Personal history of other diseases of the digestive system: Secondary | ICD-10-CM | POA: Insufficient documentation

## 2015-02-06 DIAGNOSIS — E785 Hyperlipidemia, unspecified: Secondary | ICD-10-CM | POA: Insufficient documentation

## 2015-02-06 DIAGNOSIS — Y9389 Activity, other specified: Secondary | ICD-10-CM | POA: Insufficient documentation

## 2015-02-06 DIAGNOSIS — Z720 Tobacco use: Secondary | ICD-10-CM | POA: Insufficient documentation

## 2015-02-06 DIAGNOSIS — S7011XA Contusion of right thigh, initial encounter: Secondary | ICD-10-CM | POA: Insufficient documentation

## 2015-02-06 DIAGNOSIS — I252 Old myocardial infarction: Secondary | ICD-10-CM | POA: Insufficient documentation

## 2015-02-06 DIAGNOSIS — Y9289 Other specified places as the place of occurrence of the external cause: Secondary | ICD-10-CM | POA: Insufficient documentation

## 2015-02-06 DIAGNOSIS — S199XXA Unspecified injury of neck, initial encounter: Secondary | ICD-10-CM | POA: Insufficient documentation

## 2015-02-06 DIAGNOSIS — Y998 Other external cause status: Secondary | ICD-10-CM | POA: Insufficient documentation

## 2015-02-06 DIAGNOSIS — Z791 Long term (current) use of non-steroidal anti-inflammatories (NSAID): Secondary | ICD-10-CM | POA: Insufficient documentation

## 2015-02-06 DIAGNOSIS — S29001A Unspecified injury of muscle and tendon of front wall of thorax, initial encounter: Secondary | ICD-10-CM | POA: Insufficient documentation

## 2015-02-06 DIAGNOSIS — T07XXXA Unspecified multiple injuries, initial encounter: Secondary | ICD-10-CM

## 2015-02-06 DIAGNOSIS — I2510 Atherosclerotic heart disease of native coronary artery without angina pectoris: Secondary | ICD-10-CM | POA: Insufficient documentation

## 2015-02-06 DIAGNOSIS — I10 Essential (primary) hypertension: Secondary | ICD-10-CM | POA: Insufficient documentation

## 2015-02-06 DIAGNOSIS — S6991XA Unspecified injury of right wrist, hand and finger(s), initial encounter: Secondary | ICD-10-CM | POA: Insufficient documentation

## 2015-02-06 DIAGNOSIS — Z7982 Long term (current) use of aspirin: Secondary | ICD-10-CM | POA: Insufficient documentation

## 2015-02-06 MED ORDER — HYDROCODONE-ACETAMINOPHEN 5-325 MG PO TABS
2.0000 | ORAL_TABLET | Freq: Once | ORAL | Status: AC
  Administered 2015-02-06: 2 via ORAL
  Filled 2015-02-06: qty 2

## 2015-02-06 MED ORDER — HYDROCODONE-ACETAMINOPHEN 5-325 MG PO TABS: 1.0000 | ORAL_TABLET | ORAL | Status: DC | PRN
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
## 2015-02-06 NOTE — ED Notes (Signed)
Discharge instructions given, pt demonstrated teach back and verbal understanding. No concerns voiced.  

## 2015-02-06 NOTE — ED Notes (Signed)
Patient c/o extreme pain to right forearm and wants pain meds, EDP notified, patient will be seen next.

## 2015-02-06 NOTE — Discharge Instructions (Signed)
Contusion Take the pain medicine prescribed for bad pain or ibuprofen for mild pain. Don't take the pain medicine prescribed together with alcohol or with Xanax(alprazolam) which was prescribed to you aat an earlier time, as the combination can be dangerous. See an urgent care center or your local health department if continued pain after 3 or 4 days. Your local health department can act as a primary care physician.  A contusion is a deep bruise. Contusions happen when an injury causes bleeding under the skin. Signs of bruising include pain, puffiness (swelling), and discolored skin. The contusion may turn blue, purple, or yellow. HOME CARE   Put ice on the injured area.  Put ice in a plastic bag.  Place a towel between your skin and the bag.  Leave the ice on for 15-20 minutes, 03-04 times a day.  Only take medicine as told by your doctor.  Rest the injured area.  If possible, raise (elevate) the injured area to lessen puffiness. GET HELP RIGHT AWAY IF:   You have more bruising or puffiness.  You have pain that is getting worse.  Your puffiness or pain is not helped by medicine. MAKE SURE YOU:   Understand these instructions.  Will watch your condition.  Will get help right away if you are not doing well or get worse. Document Released: 11/29/2007 Document Revised: 09/04/2011 Document Reviewed: 04/17/2011 [facility] Patient Information 2015 [place], [place]. This information is not intended to replace advice given to you by your health care provider. Make sure you discuss any questions you have with your health care provider.
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
## 2015-02-22 MED FILL — Hydrocodone-Acetaminophen Tab 5-325 MG: ORAL | Qty: 6 | Status: AC

## 2016-01-15 DIAGNOSIS — R519 Headache, unspecified: Secondary | ICD-10-CM | POA: Insufficient documentation

## 2016-01-15 DIAGNOSIS — I119 Hypertensive heart disease without heart failure: Secondary | ICD-10-CM | POA: Insufficient documentation

## 2016-01-15 DIAGNOSIS — R51 Headache: Secondary | ICD-10-CM

## 2016-01-15 DIAGNOSIS — J449 Chronic obstructive pulmonary disease, unspecified: Secondary | ICD-10-CM | POA: Insufficient documentation

## 2016-01-31 DIAGNOSIS — R0609 Other forms of dyspnea: Secondary | ICD-10-CM

## 2016-01-31 DIAGNOSIS — R5383 Other fatigue: Secondary | ICD-10-CM | POA: Insufficient documentation

## 2016-01-31 DIAGNOSIS — R9431 Abnormal electrocardiogram [ECG] [EKG]: Secondary | ICD-10-CM | POA: Insufficient documentation

## 2016-01-31 DIAGNOSIS — Z8249 Family history of ischemic heart disease and other diseases of the circulatory system: Secondary | ICD-10-CM | POA: Insufficient documentation

## 2016-01-31 DIAGNOSIS — Z955 Presence of coronary angioplasty implant and graft: Secondary | ICD-10-CM | POA: Insufficient documentation

## 2016-02-01 DIAGNOSIS — F5104 Psychophysiologic insomnia: Secondary | ICD-10-CM | POA: Insufficient documentation

## 2016-02-01 DIAGNOSIS — I25119 Atherosclerotic heart disease of native coronary artery with unspecified angina pectoris: Secondary | ICD-10-CM | POA: Insufficient documentation

## 2016-02-01 DIAGNOSIS — G8929 Other chronic pain: Secondary | ICD-10-CM | POA: Insufficient documentation

## 2016-02-01 DIAGNOSIS — K22719 Barrett's esophagus with dysplasia, unspecified: Secondary | ICD-10-CM | POA: Insufficient documentation

## 2016-02-01 DIAGNOSIS — F411 Generalized anxiety disorder: Secondary | ICD-10-CM | POA: Insufficient documentation

## 2016-02-01 DIAGNOSIS — M2550 Pain in unspecified joint: Secondary | ICD-10-CM

## 2016-02-01 DIAGNOSIS — R351 Nocturia: Secondary | ICD-10-CM | POA: Insufficient documentation

## 2016-02-17 ENCOUNTER — Emergency Department (HOSPITAL_COMMUNITY)
Admission: EM | Admit: 2016-02-17 | Discharge: 2016-02-17 | Disposition: A | Discharge status: Home / Self Care | Payer: Medicaid Other | Attending: Emergency Medicine | Admitting: Emergency Medicine

## 2016-02-17 ENCOUNTER — Encounter (HOSPITAL_COMMUNITY): Payer: Self-pay

## 2016-02-17 ENCOUNTER — Emergency Department (HOSPITAL_COMMUNITY): Payer: Medicaid Other

## 2016-02-17 DIAGNOSIS — I252 Old myocardial infarction: Secondary | ICD-10-CM | POA: Diagnosis not present

## 2016-02-17 DIAGNOSIS — I10 Essential (primary) hypertension: Secondary | ICD-10-CM | POA: Diagnosis not present

## 2016-02-17 DIAGNOSIS — Y929 Unspecified place or not applicable: Secondary | ICD-10-CM | POA: Diagnosis not present

## 2016-02-17 DIAGNOSIS — W19XXXA Unspecified fall, initial encounter: Secondary | ICD-10-CM | POA: Insufficient documentation

## 2016-02-17 DIAGNOSIS — Y939 Activity, unspecified: Secondary | ICD-10-CM | POA: Diagnosis not present

## 2016-02-17 DIAGNOSIS — M25531 Pain in right wrist: Secondary | ICD-10-CM | POA: Diagnosis present

## 2016-02-17 DIAGNOSIS — Z8781 Personal history of (healed) traumatic fracture: Secondary | ICD-10-CM | POA: Insufficient documentation

## 2016-02-17 DIAGNOSIS — Z79899 Other long term (current) drug therapy: Secondary | ICD-10-CM | POA: Insufficient documentation

## 2016-02-17 DIAGNOSIS — Z7982 Long term (current) use of aspirin: Secondary | ICD-10-CM | POA: Insufficient documentation

## 2016-02-17 DIAGNOSIS — S63501A Unspecified sprain of right wrist, initial encounter: Secondary | ICD-10-CM | POA: Diagnosis not present

## 2016-02-17 DIAGNOSIS — F17210 Nicotine dependence, cigarettes, uncomplicated: Secondary | ICD-10-CM | POA: Diagnosis not present

## 2016-02-17 DIAGNOSIS — Y999 Unspecified external cause status: Secondary | ICD-10-CM | POA: Diagnosis not present

## 2016-02-17 DIAGNOSIS — I2510 Atherosclerotic heart disease of native coronary artery without angina pectoris: Secondary | ICD-10-CM | POA: Insufficient documentation

## 2016-02-17 MED ORDER — IBUPROFEN 800 MG PO TABS
800.0000 mg | ORAL_TABLET | Freq: Once | ORAL | Status: AC
  Administered 2016-02-17: 800 mg via ORAL
  Filled 2016-02-17: qty 1

## 2016-02-17 MED ORDER — IBUPROFEN 600 MG PO TABS: 600.0000 mg | ORAL_TABLET | Freq: Four times a day (QID) | ORAL | 0 refills | Status: DC | PRN

## 2016-02-17 MED ORDER — OXYCODONE-ACETAMINOPHEN 5-325 MG PO TABS
1.0000 | ORAL_TABLET | Freq: Once | ORAL | Status: AC
  Administered 2016-02-17: 1 via ORAL
  Filled 2016-02-17: qty 1

## 2016-02-17 MED ORDER — HYDROCODONE-ACETAMINOPHEN 5-325 MG PO TABS: ORAL_TABLET | ORAL | 0 refills | Status: DC

## 2016-02-17 NOTE — ED Provider Notes (Signed)
[facility] DEPT Provider Note   CSN: [id number] Arrival date & time: 02/17/16  0930     History   Chief Complaint Chief Complaint  Patient presents with  . Wrist Pain    HPI Bradley Hays is a 45 y.o. male.  HPI   Bradley Hays is a 45 y.o. male who presents to the Emergency Department complaining of sudden onset of right wrist pain that began this morning after a mechanical fall onto an outstretched hand. He reports pain with movement of his wrist. Patient has previous wrist fracture 2 years ago that resulted in surgical repair and his pain today is along the incision site. He denies swelling or numbness or pain radiating to the elbow. He has not tried any therapies or medications prior to arrival.  Past Medical History:  Diagnosis Date  . 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 Cocaine abuse    remote, quit 2010  . Gastroesophageal reflux disease   . Hyperlipidemia    lipid profile in 2011:185, 169, 37, 114; 2012: Total cholesterol of 311 and triglycerides of 1730  . Hypertension   . MI, old   . Tobacco abuse    40 pack years; 0.5 PPD  . 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 Preoperative cardiovascular examination 04/02/2014  . Closed fracture of distal ends of radius and ulna, bilateral 04/02/2014  . Distal radius fracture 04/02/2014  . Fatty liver 10/27/2011  . RUQ pain 10/27/2011  . Nausea 10/27/2011  . Hypertension   . Hyperlipidemia   . Tobacco abuse   . Gastroesophageal reflux disease   . Cocaine abuse   . Arteriosclerotic cardiovascular disease  (ASCVD) 11/10/2009    Past Surgical History:  Procedure Laterality Date  . APPENDECTOMY  2009  . CORONARY STENT PLACEMENT  2011   bare metal  . EYE SURGERY    . KNEE ARTHROSCOPY    . NISSEN FUNDOPLICATION  1992  . ORIF WRIST FRACTURE Bilateral 04/02/2014   Procedure: OPEN REDUCTION INTERNAL FIXATION (ORIF) WRIST FRACTURE AND CARPAL TUNNEL RELEASES;  Surgeon: Dominica Severin, MD;  Location: [facility] OR;  Service: Orthopedics;  Laterality: Bilateral;       Home Medications    Prior to Admission medications   Medication Sig Start Date End Date Taking? Authorizing Provider  ALPRAZolam Prudy Feeler) 0.5 MG tablet Take 1 tablet (0.5 mg total) by mouth 2 (two) times daily. Patient not taking: Reported on 06/05/2014 04/04/14   Dominica Severin, MD  aspirin EC 81 MG tablet Take 81 mg by mouth daily.    Historical Provider, MD  Aspirin-Acetaminophen-Caffeine (GOODYS EXTRA STRENGTH PO) Take 1 packet by mouth every 4 (four) hours as needed (Pain).    Historical Provider, MD  HYDROcodone-acetaminophen (NORCO) 5-325 MG per tablet Take 1-2 tablets by mouth every 4 (four) hours as needed for severe pain. 02/06/15   Doug Sou, MD  HYDROmorphone (DILAUDID) 2 MG tablet Take 1 tablet (2 mg total) by mouth every 4 (four) hours as needed for severe pain. Patient not taking: Reported on 06/05/2014 04/04/14   Dominica Severin,  MD  ibuprofen (ADVIL,MOTRIN) 800 MG tablet Take 1 tablet (800 mg total) by mouth 3 (three) times daily. 12/27/14   Ashya Nicolaisen, PA-C  lisinopril (PRINIVIL,ZESTRIL) 10 MG tablet Take 1 tablet (10 mg total) by mouth daily. Patient not taking: Reported on 06/05/2014 04/04/14   Dominica Severin, MD  metoprolol tartrate (LOPRESSOR) 25 MG tablet Take 1 tablet (25 mg total) by mouth 2 (two) times daily. Patient not taking: Reported on 06/05/2014 04/04/14   Dominica Severin, MD  naproxen (NAPROSYN) 500 MG tablet Take 500 mg by mouth 2 (two) times daily as needed for mild pain.    Historical Provider, MD    oxyCODONE-acetaminophen (PERCOCET/ROXICET) 5-325 MG per tablet Take 1 tablet by mouth every 4 (four) hours as needed. 12/27/14   Sotero Brinkmeyer, PA-C  simvastatin (ZOCOR) 20 MG tablet Take 1 tablet (20 mg total) by mouth daily at 6 PM. Patient not taking: Reported on 06/05/2014 04/04/14   Dominica Severin, MD    Family History Family History  Problem Relation Age of Onset  . Heart disease    . Arthritis    . Lung disease    . Cancer    . Asthma    . Kidney disease      Social History Social History  Substance Use Topics  . Smoking status: Current Every Day Smoker    Packs/day: 0.50    Years: 26.00    Types: Cigarettes  . Smokeless tobacco: Never Used  . Alcohol use Yes     Comment: occasionally      Allergies   Review of patient's allergies indicates no known allergies.   Review of Systems Review of Systems  Constitutional: Negative for chills and fever.  Genitourinary: Negative for difficulty urinating and dysuria.  Musculoskeletal: Positive for arthralgias (Right wrist pain). Negative for joint swelling and neck pain.  Skin: Negative for color change and wound.  Neurological: Negative for numbness.  All other systems reviewed and are negative.    Physical Exam Updated Vital Signs BP 159/99 (BP Location: Right Arm)   Pulse 84   Temp 98 F (36.7 C) (Oral)   Resp 20   Ht 5\' 11"  (1.803 m)   Wt 104.3 kg   SpO2 100%   BMI 32.08 kg/m   Physical Exam  Constitutional: He is oriented to person, place, and time. He appears well-developed and well-nourished. No distress.  HENT:  Head: Normocephalic and atraumatic.  Cardiovascular: Normal rate, regular rhythm and normal heart sounds.   Pulmonary/Chest: Effort normal and breath sounds normal.  Musculoskeletal: He exhibits tenderness. He exhibits no edema.  Tenderness to palpation of the distal right wrist, no edema.  Radial pulse is brisk, distal sensation intact.  CR< 2 sec.  No bruising or bony deformity.  No  proximal tenderness  Neurological: He is alert and oriented to person, place, and time. He exhibits normal muscle tone. Coordination normal.  Skin: Skin is warm and dry.  Nursing note and vitals reviewed.    ED Treatments / Results  Labs (all labs ordered are listed, but only abnormal results are displayed) Labs Reviewed - No data to display  EKG  EKG Interpretation None       Radiology Dg Wrist Complete Right  Result Date: 02/17/2016 CLINICAL DATA:  Fall. EXAM: RIGHT WRIST - COMPLETE 3+ VIEW COMPARISON:  02/06/2015 FINDINGS: Chronic healed fracture distal radius. Plate and screw fixation unchanged in position. 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 Mild degenerative change in  the radial ulnar joint with irregularity of the distal radius. Chronic fracture and deformity of the fifth metacarpal. Negative for acute fracture. IMPRESSION: Chronic wrist fracture unchanged.  No acute fracture. Electronically Signed   By: Marlan Palauharles  Clark M.D.   On: 02/17/2016 10:16    Procedures Procedures (including critical care time)  Medications Ordered in ED Medications  ibuprofen (ADVIL,MOTRIN) tablet 800 mg (not administered)  oxyCODONE-acetaminophen (PERCOCET/ROXICET) 5-325 MG per tablet 1 tablet (not administered)     Initial Impression / Assessment and Plan / ED Course  I have reviewed the triage vital signs and the nursing notes.  Pertinent labs & imaging results that were available during my care of the patient were reviewed by me and considered in my medical decision making (see chart for details).  Clinical Course    Wrist neurovascularly intact, x-ray negative for acute fracture.  Velcro wrist splint applied, pain improved after medications. He agrees to symptomatic  treatment and orthopedic follow-up. Requests referral to Dr.Keeling  Final Clinical Impressions(s) / ED Diagnoses   Final diagnoses:  Right wrist sprain, initial encounter     New Prescriptions New Prescriptions   No medications on file     Rosey Bathammy Assia Meanor, PA-C 02/17/16 1030    Rolland PorterMark James, MD 02/22/16 1715

## 2016-02-17 NOTE — ED Triage Notes (Addendum)
Pt reports he had his dog on a leash this morning and fell off of front porch steps.  C/O pain to r wrist.  Reports has history of surgery to R wrist.  Radial pulse present, skin warm and dry, pt can make fist but not without discomfort.

## 2016-02-17 NOTE — Discharge Instructions (Signed)
Elevate and apply ice packs on/off to your wrist.  Follow-up with Dr. Hilda LiasKeeling for recheck if needed

## 2016-03-01 DIAGNOSIS — Z8781 Personal history of (healed) traumatic fracture: Secondary | ICD-10-CM | POA: Insufficient documentation

## 2016-03-01 DIAGNOSIS — Z9229 Personal history of other drug therapy: Secondary | ICD-10-CM | POA: Insufficient documentation

## 2016-03-29 DIAGNOSIS — M1990 Unspecified osteoarthritis, unspecified site: Secondary | ICD-10-CM | POA: Insufficient documentation

## 2016-03-29 DIAGNOSIS — Z0289 Encounter for other administrative examinations: Secondary | ICD-10-CM | POA: Insufficient documentation

## 2016-09-19 ENCOUNTER — Emergency Department (HOSPITAL_COMMUNITY)
Admission: EM | Admit: 2016-09-19 | Discharge: 2016-09-19 | Disposition: A | Discharge status: Home / Self Care | Payer: Medicaid Other | Attending: Emergency Medicine | Admitting: Emergency Medicine

## 2016-09-19 ENCOUNTER — Encounter (HOSPITAL_COMMUNITY): Payer: Self-pay | Admitting: *Deleted

## 2016-09-19 DIAGNOSIS — Z79899 Other long term (current) drug therapy: Secondary | ICD-10-CM | POA: Diagnosis not present

## 2016-09-19 DIAGNOSIS — I2510 Atherosclerotic heart disease of native coronary artery without angina pectoris: Secondary | ICD-10-CM | POA: Diagnosis not present

## 2016-09-19 DIAGNOSIS — Y999 Unspecified external cause status: Secondary | ICD-10-CM | POA: Insufficient documentation

## 2016-09-19 DIAGNOSIS — X501XXA Overexertion from prolonged static or awkward postures, initial encounter: Secondary | ICD-10-CM | POA: Diagnosis not present

## 2016-09-19 DIAGNOSIS — Z7982 Long term (current) use of aspirin: Secondary | ICD-10-CM | POA: Insufficient documentation

## 2016-09-19 DIAGNOSIS — S4992XA Unspecified injury of left shoulder and upper arm, initial encounter: Secondary | ICD-10-CM | POA: Diagnosis present

## 2016-09-19 DIAGNOSIS — F17210 Nicotine dependence, cigarettes, uncomplicated: Secondary | ICD-10-CM | POA: Diagnosis not present

## 2016-09-19 DIAGNOSIS — Y939 Activity, unspecified: Secondary | ICD-10-CM | POA: Diagnosis not present

## 2016-09-19 DIAGNOSIS — I10 Essential (primary) hypertension: Secondary | ICD-10-CM | POA: Insufficient documentation

## 2016-09-19 DIAGNOSIS — S46212A Strain of muscle, fascia and tendon of other parts of biceps, left arm, initial encounter: Secondary | ICD-10-CM | POA: Diagnosis not present

## 2016-09-19 DIAGNOSIS — Y929 Unspecified place or not applicable: Secondary | ICD-10-CM | POA: Insufficient documentation

## 2016-09-19 DIAGNOSIS — I252 Old myocardial infarction: Secondary | ICD-10-CM | POA: Insufficient documentation

## 2016-09-19 MED ORDER — HYDROCODONE-ACETAMINOPHEN 5-325 MG PO TABS
1.0000 | ORAL_TABLET | Freq: Once | ORAL | Status: AC
  Administered 2016-09-19: 1 via ORAL
  Filled 2016-09-19: qty 1

## 2016-09-19 MED ORDER — IBUPROFEN 800 MG PO TABS
800.0000 mg | ORAL_TABLET | Freq: Once | ORAL | Status: AC
  Administered 2016-09-19: 800 mg via ORAL
  Filled 2016-09-19: qty 1

## 2016-09-19 NOTE — ED Triage Notes (Signed)
Pt c/o pain, swelling and limited ROM to left arm after using his arm to catch himself before falling last night; pt states he grabbed on to a rail and used the weight of his body with his arm before he fell; pt states he heard a loud "pop" and pt now has swelling to bicep and pain; pt has positive pulse to left wrist

## 2016-09-19 NOTE — ED Provider Notes (Signed)
[facility] Provider Note   CSN: [id number] Arrival date & time: 09/19/16  0550     History   Chief Complaint Chief Complaint  Patient presents with  . Arm Pain    HPI Bradley Hays is a 46 y.o. male.  The history is provided by the patient.  Arm Pain  This is a new problem. The current episode started yesterday. The problem occurs constantly. The problem has been gradually worsening. Pertinent negatives include no chest pain and no abdominal pain. The symptoms are aggravated by bending. The symptoms are relieved by rest.   Pt reports he grabbed a bar with his left hand and flexed at his elbow and heard a "pop" and had immediate pain and swelling to left upper extremity No fall or direct trauma to his left arm No weakness/numbness to his left arm No other acute complaints Past Medical History:  Diagnosis Date  . 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 Cocaine abuse    remote, quit 2010  . Gastroesophageal reflux disease   . Hyperlipidemia    lipid profile in 2011:185, 169, 37, 114; 2012: Total cholesterol of 311 and triglycerides of 1730  . Hypertension   . MI, old   . Tobacco abuse    40 pack years; 0.5 PPD  . 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 Preoperative cardiovascular examination 04/02/2014  . Closed fracture of distal ends of radius and ulna, bilateral 04/02/2014  . Distal radius fracture 04/02/2014  . Fatty liver 10/27/2011  . RUQ pain 10/27/2011  . Nausea 10/27/2011  . Hypertension   . Hyperlipidemia   . Tobacco abuse   . Gastroesophageal reflux  disease   . Cocaine abuse   . Arteriosclerotic cardiovascular disease (ASCVD) 11/10/2009    Past Surgical History:  Procedure Laterality Date  . APPENDECTOMY  2009  . CORONARY STENT PLACEMENT  2011   bare metal  . EYE SURGERY    . KNEE ARTHROSCOPY    . NISSEN FUNDOPLICATION  1992  . ORIF WRIST FRACTURE Bilateral 04/02/2014   Procedure: OPEN REDUCTION INTERNAL FIXATION (ORIF) WRIST FRACTURE AND CARPAL TUNNEL RELEASES;  Surgeon: Dominica SeverinWilliam Gramig, MD;  Location: [facility] OR;  Service: Orthopedics;  Laterality: Bilateral;       Home Medications    Prior to Admission medications   Medication Sig Start Date End Date Taking? Authorizing Provider  aspirin EC 81 MG tablet Take 81 mg by mouth daily.   Yes Historical Provider, MD  lisinopril (PRINIVIL,ZESTRIL) 10 MG tablet Take 1 tablet (10 mg total) by mouth daily. 04/04/14  Yes Dominica SeverinWilliam Gramig, MD    Family History Family History  Problem Relation Age of Onset  . Heart disease    . Arthritis    . Lung disease    . Cancer    . Asthma    . Kidney disease      Social History Social History  Substance Use Topics  . Smoking status: Current Every Day Smoker    Packs/day: 0.50    Years: 26.00    Types: Cigarettes  .  Smokeless tobacco: Never Used  . Alcohol use Yes     Comment: occasionally      Allergies   Patient has no known allergies.   Review of Systems Review of Systems  Constitutional: Negative for fever.  Cardiovascular: Negative for chest pain.  Gastrointestinal: Negative for abdominal pain.  Musculoskeletal: Positive for arthralgias. Negative for back pain.  Neurological: Negative for weakness and numbness.  All other systems reviewed and are negative.    Physical Exam Updated Vital Signs BP (!) 154/99 (BP Location: Right Arm)   Pulse 90   Temp 98.1 F (36.7 C) (Oral)   Resp 16   Ht 5\' 11"  (1.803 m)   Wt 106.6 kg   SpO2 95%   BMI 32.78 kg/m   Physical Exam CONSTITUTIONAL: Well developed/well nourished,  anxious HEAD: Normocephalic/atraumatic EYES: EOMI ENMT: Mucous membranes moist NECK: supple no meningeal signs CV: S1/S2 noted LUNGS:scattered wheezing bilaterally, no apparent distress ABDOMEN: soft NEURO: Pt is awake/alert/appropriate, moves all extremitiesx4.  No facial droop.  He is able to move left hand and wiggles all fingers without difficulty EXTREMITIES: pulses normal/equal in both upper extremities "popeye sign" of left bicep noted and it is worsened by left elbow flexion.  No bruising.  No crepitus.  No bony tenderness noted to left shoulder/elbow.  No bony deformity noted SKIN: warm, color normal PSYCH: anxious  ED Treatments / Results  Labs (all labs ordered are listed, but only abnormal results are displayed) Labs Reviewed - No data to display  EKG  EKG Interpretation None       Radiology No results found.  Procedures Procedures   SPLINT APPLICATION Date/Time: 6:21 AM Authorized by: Joya Gaskins Consent: Verbal consent obtained. Risks and benefits: risks, benefits and alternatives were discussed Consent given by: patient Splint applied by: nurse Location details: left arm Splint type: sling Supplies used: sling Post-procedure: The splinted body part was neurovascularly unchanged following the procedure. Patient tolerance: Patient tolerated the procedure well with no immediate complications.     Medications Ordered in ED Medications  HYDROcodone-acetaminophen (NORCO/VICODIN) 5-325 MG per tablet 1 tablet (1 tablet Oral Given 09/19/16 0619)  ibuprofen (ADVIL,MOTRIN) tablet 800 mg (800 mg Oral Given 09/19/16 1914)     Initial Impression / Assessment and Plan / ED Course  I have reviewed the triage vital signs and the nursing notes.      Pt with proximal bicep tendon rupture No other acute issues at this time Sling provided Advised ICE and NSAIDs Referred to ortho  Final Clinical Impressions(s) / ED Diagnoses   Final diagnoses:    Biceps tendon rupture, proximal, left, initial encounter    New Prescriptions New Prescriptions   No medications on file     Zadie Rhine, MD 09/19/16 [phone]

## 2016-09-20 ENCOUNTER — Encounter (INDEPENDENT_AMBULATORY_CARE_PROVIDER_SITE_OTHER): Payer: Self-pay | Admitting: Orthopedic Surgery

## 2016-09-20 ENCOUNTER — Other Ambulatory Visit (INDEPENDENT_AMBULATORY_CARE_PROVIDER_SITE_OTHER): Payer: Self-pay | Admitting: *Deleted

## 2016-09-20 ENCOUNTER — Ambulatory Visit (INDEPENDENT_AMBULATORY_CARE_PROVIDER_SITE_OTHER): Payer: Medicaid Other

## 2016-09-20 ENCOUNTER — Ambulatory Visit (INDEPENDENT_AMBULATORY_CARE_PROVIDER_SITE_OTHER): Payer: Medicaid Other | Admitting: Orthopedic Surgery

## 2016-09-20 ENCOUNTER — Telehealth (INDEPENDENT_AMBULATORY_CARE_PROVIDER_SITE_OTHER): Payer: Self-pay | Admitting: Orthopedic Surgery

## 2016-09-20 DIAGNOSIS — S46212A Strain of muscle, fascia and tendon of other parts of biceps, left arm, initial encounter: Secondary | ICD-10-CM

## 2016-09-20 DIAGNOSIS — M25512 Pain in left shoulder: Secondary | ICD-10-CM

## 2016-09-20 MED ORDER — TRAMADOL HCL 50 MG PO TABS: 50.0000 mg | ORAL_TABLET | Freq: Four times a day (QID) | ORAL | 0 refills | Status: DC | PRN

## 2016-09-20 NOTE — Progress Notes (Signed)
Office Visit Note   Patient: Bradley Hays           Date of Birth: [birth year]           MRN: [id number] Visit Date: 09/20/2016 Requested by: No referring provider defined for this encounter. PCP: No PCP Per Patient  Subjective: Chief Complaint  Patient presents with  . Left Shoulder - Injury    HPI: Bradley Hays is a 46 year old patient with left arm pain.  He had an injury 09/18/2016 when he tripped and fell and reach behind him to grab a post to keep her from falling.  Heart pop and developed a muscular deformity in this biceps region.  He is right-hand-dominant.  He is disabled due to heart issues.  This was just approved.  He had stent placed 2 for his heart attack.  He is not seen a cardiologist in 3 years.  He was on Plavix.  He is typically worked in Facilities manager.  He denies any right-sided symptoms.              ROS: All systems reviewed are negative as they relate to the chief complaint within the history of present illness.  Patient denies  fevers or chills.   Assessment & Plan: Visit Diagnoses:  1. 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  He may have rotator cuff pathology as well.  Statistically speaking this is probably the case.  In regards to surgical intervention he is 89 and does have this deformity.  I think if we can get to this within a couple of weeks we may be able to find the distal end of the biceps tendon and pull it proximally to reattach it.  This could give him less cramping with activity.  I did tell him that the pain would pass that some people manage with this deformity long-term.  He wants to try to avoid that if possible.  I do want to get a CT arthrogram to evaluate for rotator cuff pathology.  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  Also he will need cardiac risk stratification prior to surgery.   That will be arranged and may delay his surgery enough that biceps tenodesis may not be possible.  Nonetheless we will do our best to schedule these things as soon as possible.  I'll see him back next week just for clinical recheck and to see if he still is going to consider surgical intervention on the arm and we'll see what the CT arthrogram shows at that time.  Follow-Up Instructions: No Follow-up on file.   Orders:  Orders Placed This Encounter  Procedures  . XR Shoulder Left  . Arthrogram   No orders of the defined types were placed in this encounter.     Procedures: No procedures performed   Clinical Data: No additional findings.  Objective: Vital Signs: There were no vitals taken for this visit.  Physical Exam:   Constitutional: Patient appears well-developed HEENT:  Head: Normocephalic Eyes:EOM are normal Neck: Normal range of motion Cardiovascular: Normal rate Pulmonary/chest: Effort normal Neurologic: Patient is alert Skin: Skin is warm Psychiatric: Patient has normal mood and affect    Ortho Exam: Orthopedic exam demonstrates Popeye deformity in the left arm which is tender to palpation.  Motor sensory function to the hand is intact.  Incision is present  on the left wrist.  Cervical spine range of motion is good.  Shoulder range of motion is painful and I don't detect a lot of course grinding or crepitus but it's difficult to get a good exam because of the patient's pain sensitivity.  He is currently not on blood thinners and does not have an extreme amount of swelling from the biceps rupture.  Distally the biceps tendon is palpable and intact  Specialty Comments:  No specialty comments available.  Imaging: No results found.   PMFS History: Patient Active Problem List   Diagnosis Date Noted  . Preoperative cardiovascular examination 04/02/2014  . Closed fracture of distal ends of radius and ulna, bilateral 04/02/2014  . Distal radius fracture 04/02/2014   . Fatty liver 10/27/2011  . RUQ pain 10/27/2011  . Nausea 10/27/2011  . Hypertension   . Hyperlipidemia   . Tobacco abuse   . Gastroesophageal reflux disease   . Cocaine abuse   . Arteriosclerotic cardiovascular disease (ASCVD) 11/10/2009   Past Medical History:  Diagnosis Date  . 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 Cocaine abuse    remote, quit 2010  . Gastroesophageal reflux disease   . Hyperlipidemia    lipid profile in 2011:185, 169, 37, 114; 2012: Total cholesterol of 311 and triglycerides of 1730  . Hypertension   . MI, old   . Tobacco abuse    40 pack years; 0.5 PPD  . 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 Heart disease    . Arthritis    . Lung disease    . Cancer    . Asthma    . Kidney disease      Past Surgical History:  Procedure Laterality Date  . APPENDECTOMY  2009  . CORONARY STENT PLACEMENT  2011   bare metal  . EYE SURGERY    . KNEE ARTHROSCOPY    . NISSEN FUNDOPLICATION  1992  . ORIF WRIST FRACTURE Bilateral 04/02/2014   Procedure: OPEN REDUCTION INTERNAL FIXATION (ORIF) WRIST FRACTURE AND CARPAL TUNNEL RELEASES;  Surgeon: Dominica SeverinWilliam Gramig, MD;  Location: [facility] OR;  Service: Orthopedics;  Laterality: Bilateral;   Social History   Occupational History  . unemployed x 1 yr, Personal assistantconstruction/painter    Social History Main Topics  . Smoking status: Current Every Day Smoker    Packs/day: 0.50    Years: 26.00    Types: Cigarettes  . Smokeless tobacco: Never Used  . Alcohol use Yes     Comment: occasionally   . Drug use: No     Comment: remote cocaine, quit  2003  . Sexual activity: Not on file

## 2016-09-20 NOTE — Telephone Encounter (Signed)
LVM with patient that he has a cardiology at [facility] Friday April 6th at 9:00am to get cardiac clearance.

## 2016-09-27 ENCOUNTER — Other Ambulatory Visit: Payer: Self-pay

## 2016-09-27 NOTE — Progress Notes (Signed)
Cardiology Office Note    Date:  09/29/2016   ID:  Bradley Hays, DOB 12/08/1970, MRN [id number]  PCP:  No PCP Per Patient  Cardiologist: Dr. Rennis Golden (2015) --> Wishes to Follow-up in [place], [place]  Chief Complaint  Patient presents with  . New Patient (Initial Visit)    needs medical clearance for Left shoulder sergery, history of heart attacks, has stents,no current chest pain    History of Present Illness:    Bradley Hays is a 46 y.o. male with past medical history of CAD (s/p BMS to LCx in 2012 in the setting of cocaine use), ischemic cardiomyopathy (EF 45% at time of MI, improved to 50-55% in 2013), COPD, HTN, HLD, and tobacco use who presents to the office today for preoperative cardiac clearance.   Was initially followed by Dr. Dietrich Pates. Was seen by Dr. Rennis Golden in consult for cardiac clearance in 03/2014 as he was needing to undergo repair of his distal radius and ulna following a fall off his ladder. He denied any recent anginal symptoms and was cleared for the procedure. No post-operative complications were noted.    He was recently seen at [facility] ED on 09/19/2016 after hearing a popping sensation along his left elbow after reaching behind to grab something. He was diagnosed with a proximal bicep tendon rupture. Seen in the office by Dr. August Saucer on 09/20/2016 and is planning to undergo repair of the biceps tendon with possible rotator cuff repair.   In talking with the patient today, he reports living in [place], [place] for the past 2 years and was followed by Dr. Karalee Height while there. He underwent a cardiac catheterization in 01/2016 which from the report showed severe hypokinesis of basal-to-mid segment of inferior wall; ejection fraction 40-45%; normal filling pressures. Significant, multi-vessel atherosclerotic coronary artery sequential moderate and high grade "intra-stent" restenoses within mid and (small diameter) distal segments of left circumflex coronary  artery (note: diameter of distal segment actually matches diameter of low obtuse marginal coronary arteries, which are small diameter; thus, physiologic significance unclear); and  low/moderate grade stenosis within mid segment of right coronary artery.Medical management was recommended at that time.   He denies any recent chest pain. Has baseline dyspnea on exertion secondary to his COPD but denies any worsening of this. He walks his dog over a mile per day and denies any exertional chest pain with this. Is able to climb the stairs in his home without difficulty.   He reports taking ASA, Lisinopril, and Lopressor daily. Plavix is listed on his medication list but he has not taken this in over 1 month. Was also on Lipitor in the past (unsure of dosing) but has not taken this as well due to running out of refills. His last Lipid Profile in 12/2015 showed total cholesterol 265, triglycerides 825, and LDL 106.  He continues to smoke 0.5 ppd but has decreased this from 1.0 ppd just 6 months ago. Consumes a beer 2-3 times per week. Denies any recreational drug use. Family history is significant for his father having an MI at age 80.     Past Medical History:  Diagnosis Date  . Arteriosclerotic cardiovascular disease (ASCVD) 11/10/09   Rothbart-BMS to circumflex posterior acute MI in the setting of cocaine use, peak CK of 3590, MB of 511 and troponin of 48; posterolateral hypokinesis with EF of 50% and total obstruction of circumflex; [facility] in 09/2010 40 chest pain, d-dimer of 1.57 with negative CT; EF  of 45% with negative stress echo  . Cocaine abuse    remote, quit 2010  . Gastroesophageal reflux disease   . Hyperlipidemia    lipid profile in 2011:185, 169, 37, 114; 2012: Total cholesterol of 311 and triglycerides of 1730  . Hypertension   . MI, old   . Tobacco abuse    40 pack years; 0.5 PPD  . Trauma to eye    Retained projectile on left    Past Surgical History:  Procedure  Laterality Date  . APPENDECTOMY  2009  . CORONARY STENT PLACEMENT  2011   bare metal  . EYE SURGERY    . KNEE ARTHROSCOPY    . NISSEN FUNDOPLICATION  1992  . ORIF WRIST FRACTURE Bilateral 04/02/2014   Procedure: OPEN REDUCTION INTERNAL FIXATION (ORIF) WRIST FRACTURE AND CARPAL TUNNEL RELEASES;  Surgeon: Dominica Severin, MD;  Location: [facility] OR;  Service: Orthopedics;  Laterality: Bilateral;    Current Medications: Outpatient Medications Prior to Visit  Medication Sig Dispense Refill  . albuterol (PROVENTIL HFA;VENTOLIN HFA) 108 (90 Base) MCG/ACT inhaler Inhale 2 puffs into the lungs every 6 (six) hours.    Marland Kitchen aspirin EC 81 MG tablet Take 81 mg by mouth daily.    Marland Kitchen oxyCODONE-acetaminophen (PERCOCET/ROXICET) 5-325 MG tablet Take 1 tablet by mouth every 8 (eight) hours.    Marland Kitchen QUEtiapine (SEROQUEL) 50 MG tablet Take 50 mg by mouth at bedtime.    Marland Kitchen lisinopril (PRINIVIL,ZESTRIL) 10 MG tablet Take 1 tablet (10 mg total) by mouth daily. 30 tablet 2  . metoprolol tartrate (LOPRESSOR) 25 MG tablet Take 25 mg by mouth 2 (two) times daily.    . nitroGLYCERIN (NITROSTAT) 0.4 MG SL tablet Place 0.4 mg under the tongue as needed.    . clopidogrel (PLAVIX) 75 MG tablet Take 75 mg by mouth daily.    . fenofibrate (TRICOR) 145 MG tablet Take 145 mg by mouth daily.    . traMADol (ULTRAM) 50 MG tablet Take 1 tablet (50 mg total) by mouth every 6 (six) hours as needed. (Patient not taking: Reported on 09/29/2016) 30 tablet 0   No facility-administered medications prior to visit.      Allergies:   Patient has no known allergies.   Social History   Social History  . Marital status: Married    Spouse name: N/A  . Number of children: 3  . Years of education: N/A   Occupational History  . unemployed x 1 yr, Personal assistant    Social History Main Topics  . Smoking status: Current Every Day Smoker    Packs/day: 0.50    Years: 26.00    Types: Cigarettes  . Smokeless tobacco: Never Used  . Alcohol use  Yes     Comment: occasionally   . Drug use: No     Comment: remote cocaine, quit 2003  . Sexual activity: Not Asked   Other Topics Concern  . None   Social History Narrative   Lives w/ wife & 2 sons, 1 daughter lives w/ her mother   3 grown step-children     Family History:  The patient's family history includes Cancer in his father; Heart disease in his father; Hypertension in his father and mother.   Review of Systems:   Please see the history of present illness.     General:  No chills, fever, night sweats or weight changes.  Cardiovascular:  No chest pain, edema, orthopnea, palpitations, paroxysmal nocturnal dyspnea. Positive for dyspnea on exertion.  Dermatological: No  rash, lesions/masses Respiratory: No cough, dyspnea Urologic: No hematuria, dysuria Abdominal:   No nausea, vomiting, diarrhea, bright red blood per rectum, melena, or hematemesis MSK: Positive for left arm pain.  Neurologic:  No visual changes, wkns, changes in mental status. All other systems reviewed and are otherwise negative except as noted above.   Physical Exam:    VS:  BP (!) 140/96 (BP Location: Right Arm, Patient Position: Sitting, Cuff Size: Large) Comment: patient refuse bp check in left arm  Pulse 80   Ht  (1.803 m)   Wt 230 lb (104.3 kg)   BMI 32.08 kg/m    General: Well developed, well nourished Caucasian male appearing in no acute distress. Head: Normocephalic, atraumatic, sclera non-icteric, no xanthomas, nares are without discharge.  Neck: No carotid bruits. JVD not elevated.  Lungs: Respirations regular and unlabored, without wheezes or rales.  Heart: Regular rate and rhythm. No S3 or S4.  No murmur, no rubs, or gallops appreciated. Abdomen: Soft, non-tender, non-distended with normoactive bowel sounds. No hepatomegaly. No rebound/guarding. No obvious abdominal masses. Msk:  Strength and tone appear normal for age. Left arm in sling.  Extremities: No clubbing or cyanosis. No  lower extremity edema.  Distal pedal pulses are 2+ bilaterally. Neuro: Alert and oriented X 3. Moves all extremities spontaneously. No focal deficits noted. Psych:  Responds to questions appropriately with a normal affect. Skin: No rashes or lesions noted  Wt Readings from Last 3 Encounters:  09/29/16 230 lb (104.3 kg)  09/19/16 235 lb (106.6 kg)  02/17/16 230 lb (104.3 kg)     Studies/Labs Reviewed:   EKG:  EKG is ordered today.  The ekg ordered today demonstrates NSR, HR 80, with no acute ST or T-wave changes.   Recent Labs: No results found for requested labs within last 8760 hours.   Lipid Panel    Component Value Date/Time   CHOL 199 04/03/2014 1430   TRIG 340 (H) 04/03/2014 1430   HDL 31 (L) 04/03/2014 1430   CHOLHDL 6.4 04/03/2014 1430   VLDL 68 (H) 04/03/2014 1430   LDLCALC 100 (H) 04/03/2014 1430    Additional studies/ records that were reviewed today include:   Echocardiogram: 08/2011 Study Conclusions  - Left ventricle: The cavity size was normal. Wall thickness was increased in a pattern of mild LVH. Systolic function was normal. The estimated ejection fraction was in the range of 50% to 55%. There is akinesis and scarring of the basal-mid posterolateral myocardium. Features are consistent with a pseudonormal left ventricular filling pattern, with concomitant abnormal relaxation and increased filling pressure (grade 2 diastolic dysfunction). - Mitral valve: Trivial regurgitation. - Tricuspid valve: Trivial regurgitation. - Pericardium, extracardiac: There was no pericardial effusion.  Cardiac Catheterization: 01/2016 (From Care Everywhere) 1. Mild/moderately abnormal structure and function of left ventricle (in   setting of trace mitral valve regurgitation) characterized by: (a)   mild/moderate dilatation; (b) severe hypokinesis of basal-to-mid segment   of inferior wall; otherwise essentially normal systolic function; (c)     estimated ejection fraction 40-45%; (d) normal filling pressures (which   increase mildly abnormally following atrial contraction).    2. Significant, multi-vessel atherosclerotic coronary artery disease   characterized by: (a) sequential moderate and high grade "intra-stent"   restenoses within mid and (small diameter) distal segments of left   circumflex coronary artery (note: diameter of distal segment actually   matches diameter of low obtuse marginal coronary arteries, which are small   diameter; thus, physiologic significance unclear); (  b) low/moderate grade   stenosis within mid segment of right coronary artery.    PLAN:    1. Continue aggressive medical management oriented towards a combined -   but chronic - ischemic and hypertensive cardiomyopathy (and concurrent   dyslipidemia), all in setting of concurrent history diagnosed chronic   obstructive pulmonary disease;    2. Continue effort at complete cessation of cigarette smoking;    3. For inadequate clinical response to (1)-(2), consider po Ranolazine   and/or external enhanced counter-pulsation therapy.  Assessment:    1. Preoperative cardiovascular examination   2. Coronary artery disease involving native coronary artery of native heart without angina pectoris   3. Ischemic cardiomyopathy   4. Essential hypertension   5. Hyperlipidemia LDL goal <70   6. Chronic obstructive pulmonary disease, unspecified COPD type (HCC)   7. Tobacco use      Plan:   In order of problems listed above:  1. Preoperative Cardiac Clearance for repair of biceps tendon and possible rotator cuff repair - the patient has known CAD with BMS to LCx in 2012 and a recent cath in 01/2016. Unfortunately, the cath report from 01/2016 is limited but is read as showing sequential moderate and high grade "intra-stent" restenoses within mid and (small diameter) distal segments of left circumflex coronary artery and  low/moderate grade stenosis within mid segment of right coronary artery.Medical management was recommended at that time and PCI was not performed. - thankfully, he denies any recent anginal symptoms. EKG shows NSR with no acute ST or T-wave changes. He does have known COPD and ischemic cardiomyopathy as well.  - overall, he is of moderate-risk for the procedure due to the above conditions with no further ischemic evaluation being indicated prior to the procedure. He has not taken Plavix in over 1 month. Recommended the continual holding of this as his surgery is anticipated for next week. Would resume ASA and Plavix following the procedure.    2. CAD - s/p BMS to LCx in 2012 in the setting of cocaine use. Recent cath in 01/2016 showed significant, multi-vessel atherosclerotic coronary artery sequential moderate and high grade "intra-stent" restenoses within mid and (small diameter) distal segments of left circumflex coronary artery (note: diameter of distal segment actually matches diameter of low obtuse marginal coronary arteries, which are small diameter; thus, physiologic significance unclear. Medical management was recommended at that time.  - he denies any recent anginal symptoms. EKG today is without acute ischemic changes. - continue ASA, Plavix (has not taken this in 1+ months, informed to resume this following his procedure), and Lopressor. Will reinitate statin therapy with Atorvastatin 40mg  daily.   3. Ischemic Cardiomyopathy - EF at 45% in 2012, improved to 50-55% in 2013. Back at 40-45% by cath in 01/2016. - he does not appear volume overloaded by physical examination.  - continue BB and ACE-I. Would obtain a repeat echocardiogram within the next 6 months to reassess EF. Consider switching to Coreg or Toprol-XL if EF remains reduced.   4. HTN - BP elevated at 140/96 today. Has not taken any of his morning medications and is unsure if he has been taking Lisinopril.   - continue  Lisinopril 10mg  daily and Lopressor 25mg  BID. Encouraged to check his BP regularly at home.   5. HLD - Lipid Panel in 12/2015 showed total cholesterol 265, triglycerides 825, and LDL 106. LFT's within normal limits at that time.  - he was previously on Lipitor but unsure of dosing as he  ran out of refills and has not taken this in months.  - will restart Atorvastatin  daily. Plan for repeat FLP/LFT's in 6-8 weeks.   6. COPD - uses Advair and Albuterol PRN.  - recommended he establish with a Pulmonologist in the area as he recently relocated to [place] from [place].   7. Tobacco Use  - cessation advised.    Medication Adjustments/Labs and Tests Ordered: Current medicines are reviewed at length with the patient today.  Concerns regarding medicines are outlined above.  Medication changes, Labs and Tests ordered today are listed in the Patient Instructions below. Patient Instructions  Medication Instructions:  START atorvastatin  once daily.  Labwork: NONE  Testing/Procedures: NONE  Follow-Up: Your physician recommends that you schedule a follow-up appointment in: 2 MONTHS with Cardiologist at [facility].    Any Other Special Instructions Will Be Listed Below (If Applicable).  Refills have been sent for the following medications:  Plavix, lisinopril, lopressor, NTG.    You have been cleared for surgery-you may hold Plavix for 5 days prior to procedure.  We will fax clearance form to surgeon.  If you need a refill on your cardiac medications before your next appointment, please call your pharmacy.   Signed, Ellsworth Lennox, PA-C  09/29/2016 1:23 PM    [facility] [address], [address] [place], [place]  [address] Phone: [phone]; Fax: [phone]  [address] [place], [place] [address] Phone: [phone]

## 2016-09-29 ENCOUNTER — Telehealth (INDEPENDENT_AMBULATORY_CARE_PROVIDER_SITE_OTHER): Payer: Self-pay | Admitting: Orthopedic Surgery

## 2016-09-29 ENCOUNTER — Ambulatory Visit (INDEPENDENT_AMBULATORY_CARE_PROVIDER_SITE_OTHER): Payer: Medicaid Other | Admitting: Student

## 2016-09-29 ENCOUNTER — Encounter: Payer: Self-pay | Admitting: Student

## 2016-09-29 VITALS — BP 140/96 | HR 80 | Ht 71.0 in | Wt 230.0 lb

## 2016-09-29 DIAGNOSIS — Z720 Tobacco use: Secondary | ICD-10-CM

## 2016-09-29 DIAGNOSIS — I2510 Atherosclerotic heart disease of native coronary artery without angina pectoris: Secondary | ICD-10-CM

## 2016-09-29 DIAGNOSIS — E785 Hyperlipidemia, unspecified: Secondary | ICD-10-CM | POA: Diagnosis not present

## 2016-09-29 DIAGNOSIS — I10 Essential (primary) hypertension: Secondary | ICD-10-CM | POA: Diagnosis not present

## 2016-09-29 DIAGNOSIS — Z01810 Encounter for preprocedural cardiovascular examination: Secondary | ICD-10-CM | POA: Diagnosis not present

## 2016-09-29 DIAGNOSIS — J449 Chronic obstructive pulmonary disease, unspecified: Secondary | ICD-10-CM

## 2016-09-29 DIAGNOSIS — I255 Ischemic cardiomyopathy: Secondary | ICD-10-CM | POA: Diagnosis not present

## 2016-09-29 MED ORDER — LISINOPRIL 10 MG PO TABS: 10.0000 mg | ORAL_TABLET | Freq: Every day | ORAL | 3 refills | Status: DC

## 2016-09-29 MED ORDER — ATORVASTATIN CALCIUM 40 MG PO TABS: 40.0000 mg | ORAL_TABLET | Freq: Every day | ORAL | 3 refills | Status: DC

## 2016-09-29 MED ORDER — METOPROLOL TARTRATE 25 MG PO TABS: 25.0000 mg | ORAL_TABLET | Freq: Two times a day (BID) | ORAL | 3 refills | Status: DC

## 2016-09-29 MED ORDER — CLOPIDOGREL BISULFATE 75 MG PO TABS: 75.0000 mg | ORAL_TABLET | Freq: Every day | ORAL | 3 refills | Status: DC

## 2016-09-29 MED ORDER — NITROGLYCERIN 0.4 MG SL SUBL: 0.4000 mg | SUBLINGUAL_TABLET | SUBLINGUAL | 3 refills | Status: DC | PRN

## 2016-09-29 NOTE — Telephone Encounter (Signed)
I called spoke with patient advised Dr August Saucer not back in clinic until Monday afternoon and I would ask him then. He is to go today for appt with cardiology to see if he would be cleared for surgery. He will call us and let us know about his appt. He also is wanting MRI results.

## 2016-09-29 NOTE — Patient Instructions (Signed)
Medication Instructions:  START atorvastatin  once daily.  Labwork: NONE  Testing/Procedures: NONE  Follow-Up: Your physician recommends that you schedule a follow-up appointment in: 2 MONTHS with Cardiologist at [facility].     Any Other Special Instructions Will Be Listed Below (If Applicable).  Refills have been sent for the following medications:  Plavix, lisinopril, lopressor, NTG.    You have been cleared for surgery-you may hold Plavix for 5 days prior to procedure.  We will fax clearance form to surgeon.   If you need a refill on your cardiac medications before your next appointment, please call your pharmacy.

## 2016-09-29 NOTE — Telephone Encounter (Signed)
Pt asked if there was anything else he can get for pain. He has Tramadol but stated it was not doing anything for him.  [phone]

## 2016-09-29 NOTE — Telephone Encounter (Signed)
Patient called and would like to let us know he has been cleared from his cardiologist. He would like SU ASAP. He states he stopped taking Plavix.  CB [phone]

## 2016-10-02 ENCOUNTER — Telehealth (INDEPENDENT_AMBULATORY_CARE_PROVIDER_SITE_OTHER): Payer: Self-pay | Admitting: Orthopedic Surgery

## 2016-10-02 ENCOUNTER — Other Ambulatory Visit (INDEPENDENT_AMBULATORY_CARE_PROVIDER_SITE_OTHER): Payer: Self-pay | Admitting: Orthopedic Surgery

## 2016-10-02 DIAGNOSIS — S46212A Strain of muscle, fascia and tendon of other parts of biceps, left arm, initial encounter: Secondary | ICD-10-CM

## 2016-10-02 NOTE — Telephone Encounter (Signed)
Called advised patient I am waiting on a response from Dr August Saucer about medication change and MRI results. Advised patient would call him as soon as I knew something.

## 2016-10-02 NOTE — Telephone Encounter (Signed)
Patient wants to speak with you to go over some questions regarding his appt for wed. & surgery. He will not specify with me.  cb#: [phone]

## 2016-10-02 NOTE — Telephone Encounter (Signed)
While scheduling pt for surgery pt stated that he needs a stronger pain medicine. He stated the toradol isn't touching his pain at all and wants to know if there is anything else you can prescribe him? CB# [phone]

## 2016-10-02 NOTE — Telephone Encounter (Signed)
Patient wanting something else other than ultram for pain until he is scheduled for surgery. Also wanting to know results of his MRI scan. Please advise.

## 2016-10-02 NOTE — Telephone Encounter (Signed)
Surgery Thursday I went ahead and wrote him his pain medicine and muscle relaxer beforehand to be taken primarily after surgery but if he needs to take 1 or 2 before surgery that's fine to

## 2016-10-03 ENCOUNTER — Telehealth (INDEPENDENT_AMBULATORY_CARE_PROVIDER_SITE_OTHER): Payer: Self-pay | Admitting: Orthopedic Surgery

## 2016-10-03 MED ORDER — OXYCODONE HCL 5 MG PO CAPS: ORAL_CAPSULE | ORAL | 0 refills | Status: DC

## 2016-10-03 MED ORDER — METHOCARBAMOL 500 MG PO TABS: ORAL_TABLET | ORAL | 0 refills | Status: DC

## 2016-10-03 NOTE — Addendum Note (Signed)
Addended byPrescott Parma on: 10/03/2016 09:21 AM   Modules accepted: Orders

## 2016-10-03 NOTE — Telephone Encounter (Signed)
Patient aware. rx entered into patients chart.

## 2016-10-03 NOTE — Telephone Encounter (Signed)
auth done #[id number] Pharmacy advised as well as patient

## 2016-10-03 NOTE — Telephone Encounter (Signed)
Patient called advised he went to the pharmacy and was told his Rx Robaxin and Oxycodone need prior approval from medicaid before the Rx's can be filled.  The number to contact patient is [phone]

## 2016-10-04 ENCOUNTER — Inpatient Hospital Stay (INDEPENDENT_AMBULATORY_CARE_PROVIDER_SITE_OTHER): Payer: Medicaid Other | Admitting: Orthopedic Surgery

## 2016-10-05 ENCOUNTER — Other Ambulatory Visit (INDEPENDENT_AMBULATORY_CARE_PROVIDER_SITE_OTHER): Payer: Self-pay | Admitting: Orthopedic Surgery

## 2016-10-05 DIAGNOSIS — S46212A Strain of muscle, fascia and tendon of other parts of biceps, left arm, initial encounter: Secondary | ICD-10-CM

## 2016-10-07 ENCOUNTER — Encounter (HOSPITAL_COMMUNITY): Payer: Self-pay | Admitting: *Deleted

## 2016-10-07 NOTE — Progress Notes (Addendum)
Patient denies current chest pain. NPO status per Dr. Maple Hudson: Full breakfast up until 6 am; Clear liquids up until 12 pm; Patient given instructions and verbalized understanding. Patient not currently on plavix; requested patient stop ASA and goody powders today.

## 2016-10-09 ENCOUNTER — Other Ambulatory Visit (INDEPENDENT_AMBULATORY_CARE_PROVIDER_SITE_OTHER): Payer: Self-pay | Admitting: Orthopedic Surgery

## 2016-10-09 ENCOUNTER — Telehealth (INDEPENDENT_AMBULATORY_CARE_PROVIDER_SITE_OTHER): Payer: Self-pay | Admitting: Radiology

## 2016-10-09 NOTE — H&P (Signed)
Bradley Hays is an 46 y.o. male.   Chief Complaint: Left shoulder pain HPI: Bradley Hays is a 46 year old patient with left shoulder pain.  Patient sustained an injury to his left shoulder and subsequently had proximal biceps tendon rupture.  He does not have a rotator cuff tear via arthrogram study.  He reports significant symptoms and would like to have improvement in the shoulder and biceps function.  His injury is now several weeks old and retrieval of the biceps tendon may not be possible.  All this is explained to the patient.  Unless he would like an attempt at biceps tenodesis in order to restore better function to his arm.  Past Medical History:  Diagnosis Date  . 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 Asthma   . Cocaine abuse    remote, quit 2010  . COPD (chronic obstructive pulmonary disease) (HCC)   . Gastroesophageal reflux disease   . Hyperlipidemia    lipid profile in 2011:185, 169, 37, 114; 2012: Total cholesterol of 311 and triglycerides of 1730  . Hypertension   . 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 Tobacco abuse    40 pack years; 0.5 PPD  . Trauma to eye    Retained projectile on left    Past Surgical History:  Procedure Laterality Date  . APPENDECTOMY  2009  . CORONARY STENT PLACEMENT  2011   bare metal  . EYE SURGERY    . KNEE ARTHROSCOPY    . NISSEN FUNDOPLICATION  1992  . ORIF WRIST FRACTURE Bilateral 04/02/2014   Procedure: OPEN REDUCTION INTERNAL FIXATION (ORIF) WRIST FRACTURE AND CARPAL TUNNEL RELEASES;  Surgeon: Dominica Severin, MD;  Location: [facility] OR;  Service: Orthopedics;  Laterality: Bilateral;    Family History  Problem Relation Age of  Onset  . Heart disease    . Arthritis    . Lung disease    . Cancer    . Asthma    . Kidney disease    . Hypertension Mother   . Cancer Father   . Heart disease Father   . Hypertension Father    Social History:  reports that he has been smoking Cigarettes.  He has a 13.00 pack-year smoking history. He has never used smokeless tobacco. He reports that he drinks alcohol. He reports that he does not use drugs.  Allergies:  Allergies  Allergen Reactions  . No Known Allergies     No prescriptions prior to admission.    No results found for this or any previous visit (from the past 48 hour(s)). No results found.  Review of Systems  Musculoskeletal: Positive for joint pain.  All other systems reviewed and are negative.   There were no vitals taken for this visit. Physical Exam  Constitutional: He appears well-developed.  HENT:  Head: Normocephalic.  Eyes: Pupils are equal, round, and reactive to light.  Neck: Normal range of motion.  Cardiovascular: Normal rate.   Respiratory: Effort normal.  Neurological: He is alert.  Skin: Skin is warm.  Psychiatric: He has a normal mood and affect.     Assessment/Plan Impression is left shoulder proximal biceps tendon rupture with some  retraction.  Plan is arthroscopy with labral debridement and attempt at biceps tenodesis.  It is explained to the patient that due to the length of time out from this injury which was required in order to obtain cardiac risk stratification that the biceps tendon may not be retrievable.  Patient understands but nonetheless wants to attempt to perform biceps tenodesis.  Risks and benefits are discussed including limited to infection or vessel damage along with incomplete pain relief.  All questions answered.  Burnard Bunting, MD 10/09/2016, 8:24 PM

## 2016-10-09 NOTE — Telephone Encounter (Signed)
Called and discussed with patient

## 2016-10-09 NOTE — Progress Notes (Signed)
Anesthesia Chart Review:  Pt is a same day work up.   Pt is a 46 year old male scheduled for L shoulder arthroscopy with debridement and biceps tenodesis on 10/10/2016 with Cammy Copa, M.D.  - No PCP  - Saw Randall An, [place] with cardiology 09/29/16 for pre-op evaluation and to establish care. Was cleared for surgery at moderate to high risk. Pt to follow up with new cardiologist Dina Rich, MD in 2 months.   PMH includes:  CAD (BMS to CX in the setting of cocaine use 2011), ischemic cardiomyopathy, HTN, hyperlipidemia, COPD, asthma, hx cocaine use (quit 2010). Current smoker. BMI 32. S/p ORIF wrist fx 04/02/14.   Medications include: Albuterol, ASA 81 mg, Lipitor, lisinopril, metoprolol, Seroquel. Pt stopped his plavix on his own over a month ago; it will be restarted after surgery.   Preoperative labs will be obtained DOS.   EKG 09/29/16: NSR  Cardiac cath 02/04/16 (care everywhere): - Medical management recommended  1. Mild/moderately abnormal structure and function of left ventricle (in setting of trace mitral valve regurgitation) characterized by: (a) mild/moderate dilatation; (b) severe hypokinesis of basal-to-mid segment of inferior wall; otherwise essentially normal systolic function; (c) estimated ejection fraction 40-45%; (d) normal filling pressures (which  increase mildly abnormally following atrial contraction).  2. Significant, multi-vessel atherosclerotic coronary artery disease characterized by: (a) sequential moderate and high grade "intra-stent" restenoses within mid and (small diameter) distal segments of left CX (note: diameter of distal segment actually matches diameter of low obtuse marginal coronary arteries, which are small diameter; thus, physiologic significance unclear); (b) low/moderate grade stenosis within mid segment of RCA.     Echo 08/31/11:  - Left ventricle: The cavity size was normal. Wall thickness was increased in a pattern of mild LVH.  Systolic function was normal. The estimated ejection fraction was in the range of 50% to 55%. There is akinesis and scarring of the basal-mid posterolateral myocardium. Features are consistent with a pseudonormal left ventricular fillingpattern, with concomitant abnormal relaxation andincreased filling pressure (grade 2 diastolicdysfunction). - Mitral valve: Trivial regurgitation. - Tricuspid valve: Trivial regurgitation. - Pericardium, extracardiac: There was no pericardial effusion.  Pt will need further assessment by assigned anesthesiologist DOS. If no acute CV symptoms, I anticipate pt can proceed as scheduled.   Rica Mast, FNP-BC [facility]/Anesthesiology Phone: [phone] 10/09/2016 2:38 PM

## 2016-10-09 NOTE — Telephone Encounter (Signed)
Patient called with a few questions about his surgery. I explained to him that he is having shoulder arthroscopy and the biceps repair, but he would like to know if there is a plan to do anything to the rotator cuff.   He also states that due to his surgery being put off for over a week, he will run out of the Oxycodone and Robaxin. He has enough now, as he is only taking 4-6 Oxycodone a day, but will run out shortly after surgery. He is also taking the Robaxin and states he takes one every 8 hours. He is very concerned about this.   Please call patient back to advise today. Surgery is in the morning.  [phone]

## 2016-10-10 ENCOUNTER — Encounter (HOSPITAL_COMMUNITY): Payer: Self-pay | Admitting: *Deleted

## 2016-10-10 ENCOUNTER — Ambulatory Visit (HOSPITAL_COMMUNITY): Payer: Medicaid Other | Admitting: Emergency Medicine

## 2016-10-10 ENCOUNTER — Encounter (HOSPITAL_COMMUNITY)
Admission: RE | Disposition: A | Discharge status: Home / Self Care | Payer: Self-pay | Source: Ambulatory Visit | Attending: Orthopedic Surgery

## 2016-10-10 ENCOUNTER — Ambulatory Visit (HOSPITAL_COMMUNITY)
Admission: RE | Admit: 2016-10-10 | Discharge: 2016-10-10 | Disposition: A | Discharge status: Home / Self Care | Payer: Medicaid Other | Source: Ambulatory Visit | Attending: Orthopedic Surgery | Admitting: Orthopedic Surgery

## 2016-10-10 DIAGNOSIS — E785 Hyperlipidemia, unspecified: Secondary | ICD-10-CM | POA: Insufficient documentation

## 2016-10-10 DIAGNOSIS — I255 Ischemic cardiomyopathy: Secondary | ICD-10-CM | POA: Diagnosis not present

## 2016-10-10 DIAGNOSIS — S43492A Other sprain of left shoulder joint, initial encounter: Secondary | ICD-10-CM | POA: Insufficient documentation

## 2016-10-10 DIAGNOSIS — I2510 Atherosclerotic heart disease of native coronary artery without angina pectoris: Secondary | ICD-10-CM | POA: Insufficient documentation

## 2016-10-10 DIAGNOSIS — J449 Chronic obstructive pulmonary disease, unspecified: Secondary | ICD-10-CM | POA: Insufficient documentation

## 2016-10-10 DIAGNOSIS — K219 Gastro-esophageal reflux disease without esophagitis: Secondary | ICD-10-CM | POA: Insufficient documentation

## 2016-10-10 DIAGNOSIS — I10 Essential (primary) hypertension: Secondary | ICD-10-CM | POA: Diagnosis not present

## 2016-10-10 DIAGNOSIS — I252 Old myocardial infarction: Secondary | ICD-10-CM | POA: Diagnosis not present

## 2016-10-10 DIAGNOSIS — X58XXXA Exposure to other specified factors, initial encounter: Secondary | ICD-10-CM | POA: Diagnosis not present

## 2016-10-10 DIAGNOSIS — S46212D Strain of muscle, fascia and tendon of other parts of biceps, left arm, subsequent encounter: Secondary | ICD-10-CM | POA: Diagnosis not present

## 2016-10-10 DIAGNOSIS — S46212A Strain of muscle, fascia and tendon of other parts of biceps, left arm, initial encounter: Secondary | ICD-10-CM | POA: Diagnosis not present

## 2016-10-10 DIAGNOSIS — F17210 Nicotine dependence, cigarettes, uncomplicated: Secondary | ICD-10-CM | POA: Diagnosis not present

## 2016-10-10 DIAGNOSIS — Z955 Presence of coronary angioplasty implant and graft: Secondary | ICD-10-CM | POA: Diagnosis not present

## 2016-10-10 HISTORY — PX: SHOULDER ARTHROSCOPY WITH DEBRIDEMENT AND BICEP TENDON REPAIR: SHX5690

## 2016-10-10 HISTORY — DX: Chronic obstructive pulmonary disease, unspecified: J44.9

## 2016-10-10 HISTORY — DX: Unspecified asthma, uncomplicated: J45.909

## 2016-10-10 LAB — BASIC METABOLIC PANEL
ANION GAP: 11 (ref 5–15)
BUN: 10 mg/dL (ref 6–20)
CO2: 23 mmol/L (ref 22–32)
Calcium: 8.8 mg/dL — ABNORMAL LOW (ref 8.9–10.3)
Chloride: 105 mmol/L (ref 101–111)
Creatinine, Ser: 0.76 mg/dL (ref 0.61–1.24)
GLUCOSE: 94 mg/dL (ref 65–99)
POTASSIUM: 3.8 mmol/L (ref 3.5–5.1)
Sodium: 139 mmol/L (ref 135–145)

## 2016-10-10 LAB — CBC
HEMATOCRIT: 45.5 % (ref 39.0–52.0)
HEMOGLOBIN: 15.8 g/dL (ref 13.0–17.0)
MCH: 30.4 pg (ref 26.0–34.0)
MCHC: 34.7 g/dL (ref 30.0–36.0)
MCV: 87.7 fL (ref 78.0–100.0)
Platelets: 201 10*3/uL (ref 150–400)
RBC: 5.19 MIL/uL (ref 4.22–5.81)
RDW: 13.4 % (ref 11.5–15.5)
WBC: 9.3 10*3/uL (ref 4.0–10.5)

## 2016-10-10 SURGERY — SHOULDER ARTHROSCOPY WITH DEBRIDEMENT AND BICEP TENDON REPAIR
Anesthesia: General | Site: Shoulder | Laterality: Left

## 2016-10-10 MED ORDER — CEFAZOLIN SODIUM-DEXTROSE 2-4 GM/100ML-% IV SOLN
INTRAVENOUS | Status: AC
  Filled 2016-10-10: qty 100

## 2016-10-10 MED ORDER — FENTANYL CITRATE (PF) 100 MCG/2ML IJ SOLN
100.0000 ug | Freq: Once | INTRAMUSCULAR | Status: AC
  Administered 2016-10-10: 100 ug via INTRAVENOUS

## 2016-10-10 MED ORDER — METOPROLOL TARTRATE 12.5 MG HALF TABLET
ORAL_TABLET | ORAL | Status: AC
  Administered 2016-10-10: 25 mg via ORAL
  Filled 2016-10-10: qty 2

## 2016-10-10 MED ORDER — ROCURONIUM BROMIDE 50 MG/5ML IV SOSY
PREFILLED_SYRINGE | INTRAVENOUS | Status: AC
  Filled 2016-10-10: qty 5

## 2016-10-10 MED ORDER — LIDOCAINE HCL (CARDIAC) 20 MG/ML IV SOLN
INTRAVENOUS | Status: DC | PRN
  Administered 2016-10-10: 60 mg via INTRAVENOUS

## 2016-10-10 MED ORDER — CEFAZOLIN SODIUM-DEXTROSE 2-4 GM/100ML-% IV SOLN
2.0000 g | INTRAVENOUS | Status: AC
  Administered 2016-10-10: 2 g via INTRAVENOUS

## 2016-10-10 MED ORDER — MIDAZOLAM HCL 5 MG/5ML IJ SOLN
INTRAMUSCULAR | Status: DC | PRN
  Administered 2016-10-10: 2 mg via INTRAVENOUS

## 2016-10-10 MED ORDER — SODIUM CHLORIDE 0.9 % IR SOLN
Status: DC | PRN
  Administered 2016-10-10 (×3): 3000 mL

## 2016-10-10 MED ORDER — LIDOCAINE 2% (20 MG/ML) 5 ML SYRINGE
INTRAMUSCULAR | Status: AC
  Filled 2016-10-10: qty 5

## 2016-10-10 MED ORDER — DEXAMETHASONE SODIUM PHOSPHATE 10 MG/ML IJ SOLN
INTRAMUSCULAR | Status: DC | PRN
  Administered 2016-10-10: 10 mg via INTRAVENOUS

## 2016-10-10 MED ORDER — MIDAZOLAM HCL 2 MG/2ML IJ SOLN
INTRAMUSCULAR | Status: AC
  Filled 2016-10-10: qty 2

## 2016-10-10 MED ORDER — MIDAZOLAM HCL 2 MG/2ML IJ SOLN
INTRAMUSCULAR | Status: AC
  Administered 2016-10-10: 2 mg via INTRAVENOUS
  Filled 2016-10-10: qty 2

## 2016-10-10 MED ORDER — OXYCODONE HCL 5 MG PO TABS
5.0000 mg | ORAL_TABLET | Freq: Once | ORAL | Status: AC
Start: 2016-10-10 — End: 2016-10-10
  Administered 2016-10-10: 5 mg via ORAL

## 2016-10-10 MED ORDER — CHLORHEXIDINE GLUCONATE 4 % EX LIQD: 60.0000 mL | Freq: Once | CUTANEOUS | Status: DC

## 2016-10-10 MED ORDER — METOPROLOL TARTRATE 12.5 MG HALF TABLET
25.0000 mg | ORAL_TABLET | Freq: Once | ORAL | Status: AC
  Administered 2016-10-10: 25 mg via ORAL

## 2016-10-10 MED ORDER — ROCURONIUM BROMIDE 100 MG/10ML IV SOLN
INTRAVENOUS | Status: DC | PRN
  Administered 2016-10-10: 50 mg via INTRAVENOUS

## 2016-10-10 MED ORDER — FENTANYL CITRATE (PF) 250 MCG/5ML IJ SOLN
INTRAMUSCULAR | Status: AC
  Filled 2016-10-10: qty 5

## 2016-10-10 MED ORDER — ARTIFICIAL TEARS OP OINT
TOPICAL_OINTMENT | OPHTHALMIC | Status: DC | PRN
  Administered 2016-10-10: 1 via OPHTHALMIC

## 2016-10-10 MED ORDER — ONDANSETRON HCL 4 MG/2ML IJ SOLN
INTRAMUSCULAR | Status: DC | PRN
  Administered 2016-10-10: 4 mg via INTRAVENOUS

## 2016-10-10 MED ORDER — LACTATED RINGERS IV SOLN
INTRAVENOUS | Status: DC
  Administered 2016-10-10: 16:00:00 via INTRAVENOUS

## 2016-10-10 MED ORDER — PHENYLEPHRINE HCL 10 MG/ML IJ SOLN
INTRAVENOUS | Status: DC | PRN
  Administered 2016-10-10: 20 ug/min via INTRAVENOUS

## 2016-10-10 MED ORDER — SUGAMMADEX SODIUM 200 MG/2ML IV SOLN
INTRAVENOUS | Status: AC
  Filled 2016-10-10: qty 2

## 2016-10-10 MED ORDER — BUPIVACAINE-EPINEPHRINE (PF) 0.5% -1:200000 IJ SOLN
INTRAMUSCULAR | Status: DC | PRN
  Administered 2016-10-10: 30 mL via PERINEURAL

## 2016-10-10 MED ORDER — FENTANYL CITRATE (PF) 100 MCG/2ML IJ SOLN
INTRAMUSCULAR | Status: AC
  Administered 2016-10-10: 100 ug via INTRAVENOUS
  Filled 2016-10-10: qty 2

## 2016-10-10 MED ORDER — GLYCOPYRROLATE 0.2 MG/ML IJ SOLN
INTRAMUSCULAR | Status: DC | PRN
  Administered 2016-10-10: 0.4 mg via INTRAVENOUS

## 2016-10-10 MED ORDER — DEXTROSE 5 % IV SOLN
INTRAVENOUS | Status: DC | PRN
  Administered 2016-10-10: 19:00:00 via INTRAVENOUS

## 2016-10-10 MED ORDER — EPINEPHRINE PF 1 MG/ML IJ SOLN
INTRAMUSCULAR | Status: AC
  Filled 2016-10-10: qty 1

## 2016-10-10 MED ORDER — FENTANYL CITRATE (PF) 100 MCG/2ML IJ SOLN
INTRAMUSCULAR | Status: DC | PRN
  Administered 2016-10-10: 100 ug via INTRAVENOUS
  Administered 2016-10-10: 150 ug via INTRAVENOUS

## 2016-10-10 MED ORDER — PROPOFOL 10 MG/ML IV BOLUS
INTRAVENOUS | Status: DC | PRN
  Administered 2016-10-10: 200 mg via INTRAVENOUS

## 2016-10-10 MED ORDER — MIDAZOLAM HCL 2 MG/2ML IJ SOLN
2.0000 mg | Freq: Once | INTRAMUSCULAR | Status: AC
  Administered 2016-10-10: 2 mg via INTRAVENOUS

## 2016-10-10 MED ORDER — LACTATED RINGERS IV SOLN
INTRAVENOUS | Status: DC | PRN
  Administered 2016-10-10: 18:00:00 via INTRAVENOUS

## 2016-10-10 MED ORDER — ONDANSETRON HCL 4 MG/2ML IJ SOLN
INTRAMUSCULAR | Status: AC
  Filled 2016-10-10: qty 2

## 2016-10-10 MED ORDER — SUCCINYLCHOLINE CHLORIDE 200 MG/10ML IV SOSY
PREFILLED_SYRINGE | INTRAVENOUS | Status: AC
  Filled 2016-10-10: qty 10

## 2016-10-10 MED ORDER — DEXAMETHASONE SODIUM PHOSPHATE 10 MG/ML IJ SOLN
INTRAMUSCULAR | Status: AC
  Filled 2016-10-10: qty 1

## 2016-10-10 MED ORDER — EPHEDRINE 5 MG/ML INJ
INTRAVENOUS | Status: AC
  Filled 2016-10-10: qty 10

## 2016-10-10 MED ORDER — OXYCODONE HCL 5 MG PO TABS
ORAL_TABLET | ORAL | Status: AC
  Administered 2016-10-10: 5 mg via ORAL
  Filled 2016-10-10: qty 1

## 2016-10-10 MED ORDER — SUGAMMADEX SODIUM 200 MG/2ML IV SOLN
INTRAVENOUS | Status: DC | PRN
  Administered 2016-10-10: 200 mg via INTRAVENOUS

## 2016-10-10 SURGICAL SUPPLY — 67 items
APL SKNCLS STERI-STRIP NONHPOA (GAUZE/BANDAGES/DRESSINGS) ×1
BENZOIN TINCTURE PRP APPL 2/3 (GAUZE/BANDAGES/DRESSINGS) ×3 IMPLANT
BLADE CUTTER GATOR 3.5 (BLADE) IMPLANT
BLADE GREAT WHITE 4.2 (BLADE) ×2 IMPLANT
BLADE GREAT WHITE 4.2MM (BLADE) ×1
BLADE SURG 11 STRL SS (BLADE) ×3 IMPLANT
BUR OVAL 6.0 (BURR) IMPLANT
CLOSURE WOUND 1/2 X4 (GAUZE/BANDAGES/DRESSINGS)
DRAPE INCISE IOBAN 66X45 STRL (DRAPES) ×6 IMPLANT
DRAPE STERI 35X30 U-POUCH (DRAPES) ×3 IMPLANT
DRAPE U-SHAPE 47X51 STRL (DRAPES) ×6 IMPLANT
DRESSING AQUACEL AG SP 3.5X6 (GAUZE/BANDAGES/DRESSINGS) IMPLANT
DRSG AQUACEL AG ADV 3.5X 4 (GAUZE/BANDAGES/DRESSINGS) ×4 IMPLANT
DRSG AQUACEL AG SP 3.5X6 (GAUZE/BANDAGES/DRESSINGS) ×3
DRSG TEGADERM 4X4.75 (GAUZE/BANDAGES/DRESSINGS) ×12 IMPLANT
DURAPREP 26ML APPLICATOR (WOUND CARE) ×3 IMPLANT
ELECT REM PT RETURN 9FT ADLT (ELECTROSURGICAL) ×3
ELECTRODE REM PT RTRN 9FT ADLT (ELECTROSURGICAL) ×1 IMPLANT
FILTER STRAW FLUID ASPIR (MISCELLANEOUS) ×3 IMPLANT
GAUZE SPONGE 4X4 12PLY STRL (GAUZE/BANDAGES/DRESSINGS) ×3 IMPLANT
GAUZE XEROFORM 1X8 LF (GAUZE/BANDAGES/DRESSINGS) ×3 IMPLANT
GLOVE BIOGEL PI IND STRL 8 (GLOVE) ×1 IMPLANT
GLOVE BIOGEL PI INDICATOR 8 (GLOVE) ×2
GLOVE SURG ORTHO 8.0 STRL STRW (GLOVE) ×3 IMPLANT
GOWN STRL REUS W/ TWL LRG LVL3 (GOWN DISPOSABLE) ×3 IMPLANT
GOWN STRL REUS W/TWL LRG LVL3 (GOWN DISPOSABLE) ×9
KIT BASIN OR (CUSTOM PROCEDURE TRAY) ×3 IMPLANT
KIT ROOM TURNOVER OR (KITS) ×3 IMPLANT
MANIFOLD NEPTUNE II (INSTRUMENTS) ×3 IMPLANT
NDL HYPO 25X1 1.5 SAFETY (NEEDLE) ×1 IMPLANT
NDL SPNL 18GX3.5 QUINCKE PK (NEEDLE) ×1 IMPLANT
NDL SUT 6 .5 CRC .975X.05 MAYO (NEEDLE) ×1 IMPLANT
NEEDLE HYPO 25X1 1.5 SAFETY (NEEDLE) ×3 IMPLANT
NEEDLE MAYO TAPER (NEEDLE) ×3
NEEDLE SPNL 18GX3.5 QUINCKE PK (NEEDLE) ×3 IMPLANT
NS IRRIG 1000ML POUR BTL (IV SOLUTION) ×3 IMPLANT
PACK SHOULDER (CUSTOM PROCEDURE TRAY) ×3 IMPLANT
PAD ARMBOARD 7.5X6 YLW CONV (MISCELLANEOUS) ×6 IMPLANT
SET ARTHROSCOPY TUBING (MISCELLANEOUS) ×3
SET ARTHROSCOPY TUBING LN (MISCELLANEOUS) ×1 IMPLANT
SLING ARM IMMOBILIZER MED (SOFTGOODS) ×2 IMPLANT
SPONGE LAP 4X18 X RAY DECT (DISPOSABLE) ×8 IMPLANT
STRIP CLOSURE SKIN 1/2X4 (GAUZE/BANDAGES/DRESSINGS) IMPLANT
SUCTION FRAZIER HANDLE 10FR (MISCELLANEOUS)
SUCTION TUBE FRAZIER 10FR DISP (MISCELLANEOUS) IMPLANT
SUT ETHILON 3 0 PS 1 (SUTURE) ×7 IMPLANT
SUT FIBERWIRE 2-0 18 17.9 3/8 (SUTURE)
SUT MNCRL 3 0 RB1 (SUTURE) ×1 IMPLANT
SUT MNCRL AB 3-0 PS2 18 (SUTURE) ×2 IMPLANT
SUT MONOCRYL 3 0 RB1 (SUTURE) ×2
SUT VIC AB 0 CT1 27 (SUTURE)
SUT VIC AB 0 CT1 27XBRD ANBCTR (SUTURE) IMPLANT
SUT VIC AB 1 CT1 27 (SUTURE)
SUT VIC AB 1 CT1 27XBRD ANBCTR (SUTURE) IMPLANT
SUT VIC AB 2-0 CT1 27 (SUTURE) ×3
SUT VIC AB 2-0 CT1 TAPERPNT 27 (SUTURE) IMPLANT
SUT VICRYL 0 UR6 27IN ABS (SUTURE) ×2 IMPLANT
SUTURE FIBERWR 2-0 18 17.9 3/8 (SUTURE) IMPLANT
SYR 20CC LL (SYRINGE) ×6 IMPLANT
SYR 3ML LL SCALE MARK (SYRINGE) ×3 IMPLANT
SYR TB 1ML LUER SLIP (SYRINGE) ×3 IMPLANT
SYSTEM TENDODESIS IMPLANT (Miscellaneous) ×2 IMPLANT
TAPE STRIPS DRAPE STRL (GAUZE/BANDAGES/DRESSINGS) ×2 IMPLANT
TOWEL OR 17X24 6PK STRL BLUE (TOWEL DISPOSABLE) ×3 IMPLANT
TOWEL OR 17X26 10 PK STRL BLUE (TOWEL DISPOSABLE) ×3 IMPLANT
WAND HAND CNTRL MULTIVAC 90 (MISCELLANEOUS) ×2 IMPLANT
WATER STERILE IRR 1000ML POUR (IV SOLUTION) ×3 IMPLANT

## 2016-10-10 NOTE — Anesthesia Procedure Notes (Signed)
Anesthesia Regional Block: Interscalene brachial plexus block   Pre-Anesthetic Checklist: ,, timeout performed, Correct Patient, Correct Site, Correct Laterality, Correct Procedure, Correct Position, site marked, Risks and benefits discussed,  Surgical consent,  Pre-op evaluation,  At surgeon's request and post-op pain management  Laterality: Left  Prep: chloraprep       Needles:  Injection technique: Single-shot  Needle Type: Echogenic Stimulator Needle     Needle Length: 5cm  Needle Gauge: 21     Additional Needles:   Procedures: ultrasound guided, nerve stimulator,,,,,,   Nerve Stimulator or Paresthesia:  Response: 0.4 mA,   Additional Responses:   Narrative:  Start time: 10/10/2016 6:20 PM End time: 10/10/2016 6:35 PM Injection made incrementally with aspirations every 5 mL.  Performed by: Personally  Anesthesiologist: Arta Bruce  Additional Notes: Monitors applied. Patient sedated. Sterile prep and drape,hand hygiene and sterile gloves were used. Relevant anatomy identified.Needle position confirmed.Local anesthetic injected incrementally after negative aspiration. Local anesthetic spread visualized around nerve(s). Vascular puncture avoided. No complications. Image printed for medical record.The patient tolerated the procedure well.

## 2016-10-10 NOTE — Brief Op Note (Signed)
10/10/2016  8:39 PM  PATIENT:  Bradley Hays  46 y.o. male  PRE-OPERATIVE DIAGNOSIS:  LEFT SHOULDER BICEPS RUPTURE  POST-OPERATIVE DIAGNOSIS:  LEFT SHOULDER BICEPS RUPTURE  PROCEDURE:  Procedure(s): LEFT SHOULDER ARTHROSCOPY WITH DEBRIDEMENT AND BICEPS TENODESIS  SURGEON:  Surgeon(s): Cammy Copa, MD  ASSISTANT: cARLA bETHUNERNFA*  ANESTHESIA:   general  EBL: 15 ml    Total I/O In: 100 [I.V.:100] Out: 50 [Blood:50]  BLOOD ADMINISTERED: none  DRAINS: none   LOCAL MEDICATIONS USED:  none  SPECIMEN:  No Specimen  COUNTS:  YES  TOURNIQUET:  * No tourniquets in log *  DICTATION: .Other Dictation: Dictation Number [id number]  PLAN OF CARE: Discharge to home after PACU  PATIENT DISPOSITION:  PACU - hemodynamically stable

## 2016-10-10 NOTE — Anesthesia Preprocedure Evaluation (Signed)
Anesthesia Evaluation  Patient identified by MRN, date of birth, ID band Patient awake    Reviewed: Allergy & Precautions, NPO status , Patient's Chart, lab work & pertinent test results  Airway Mallampati: II  TM Distance: >3 FB     Dental   Pulmonary asthma , COPD, Current Smoker,     + wheezing      Cardiovascular hypertension, + angina + CAD and + Past MI   Rhythm:Regular Rate:Normal     Neuro/Psych  Headaches,    GI/Hepatic Neg liver ROS, GERD  ,  Endo/Other  negative endocrine ROS  Renal/GU negative Renal ROS     Musculoskeletal   Abdominal   Peds  Hematology   Anesthesia Other Findings   Reproductive/Obstetrics                             Anesthesia Physical Anesthesia Plan  ASA: III  Anesthesia Plan: General   Post-op Pain Management:    Induction: Intravenous  Airway Management Planned: Oral ETT  Additional Equipment:   Intra-op Plan:   Post-operative Plan: Extubation in OR  Informed Consent: I have reviewed the patients History and Physical, chart, labs and discussed the procedure including the risks, benefits and alternatives for the proposed anesthesia with the patient or authorized representative who has indicated his/her understanding and acceptance.   Dental advisory given  Plan Discussed with: CRNA and Anesthesiologist  Anesthesia Plan Comments:         Anesthesia Quick Evaluation

## 2016-10-10 NOTE — Transfer of Care (Signed)
Immediate Anesthesia Transfer of Care Note  Patient: Bradley Hays  Procedure(s) Performed: Procedure(s): LEFT SHOULDER ARTHROSCOPY WITH DEBRIDEMENT AND BICEPS TENODESIS (Left)  Patient Location: PACU  Anesthesia Type:General, Regional and GA combined with regional for post-op pain  Level of Consciousness: awake, oriented, sedated, patient cooperative and responds to stimulation  Airway & Oxygen Therapy: Patient Spontanous Breathing and Patient connected to face mask oxygen  Post-op Assessment: Report given to RN, Post -op Vital signs reviewed and stable, Patient moving all extremities and Patient moving all extremities X 4  Post vital signs: Reviewed and stable  Last Vitals:  Vitals:   10/10/16 1835 10/10/16 2040  BP:  (!) 179/98  Pulse: 79 95  Resp: 13 20  Temp:  36.5 C    Last Pain:  Vitals:   10/10/16 2040  TempSrc:   PainSc: 0-No pain      Patients Stated Pain Goal: 4 (10/10/16 1539)  Complications: No apparent anesthesia complications

## 2016-10-10 NOTE — Anesthesia Procedure Notes (Signed)
Procedure Name: Intubation Date/Time: 10/10/2016 7:01 PM Performed by: Jacquiline Doe A Pre-anesthesia Checklist: Patient identified, Emergency Drugs available, Suction available and Patient being monitored Patient Re-evaluated:Patient Re-evaluated prior to inductionOxygen Delivery Method: Circle System Utilized and Circle system utilized Preoxygenation: Pre-oxygenation with 100% oxygen Intubation Type: IV induction and Cricoid Pressure applied Ventilation: Mask ventilation without difficulty Laryngoscope Size: Mac and 4 Grade View: Grade I Tube type: Oral Tube size: 7.5 mm Number of attempts: 1 Airway Equipment and Method: Stylet Placement Confirmation: ETT inserted through vocal cords under direct vision,  positive ETCO2 and breath sounds checked- equal and bilateral Secured at: 23 cm Tube secured with: Tape Dental Injury: Teeth and Oropharynx as per pre-operative assessment

## 2016-10-11 ENCOUNTER — Telehealth (INDEPENDENT_AMBULATORY_CARE_PROVIDER_SITE_OTHER): Payer: Self-pay | Admitting: Orthopedic Surgery

## 2016-10-11 ENCOUNTER — Encounter (HOSPITAL_COMMUNITY): Payer: Self-pay | Admitting: Orthopedic Surgery

## 2016-10-11 NOTE — Telephone Encounter (Signed)
Patient called advised his Rx need prior approval by medicaid before the pharmacy will fill them. Patient asked for a call back to let him know when he can pick up his Rx (Oxycodone 5 mg and Robaxin . The number to contact patient is [phone]

## 2016-10-11 NOTE — Anesthesia Postprocedure Evaluation (Signed)
Anesthesia Post Note  Patient: JANCARLO BIERMANN  Procedure(s) Performed: Procedure(s) (LRB): LEFT SHOULDER ARTHROSCOPY WITH DEBRIDEMENT AND BICEPS TENODESIS (Left)  Patient location during evaluation: PACU Anesthesia Type: General Level of consciousness: awake and alert Pain management: pain level controlled Vital Signs Assessment: post-procedure vital signs reviewed and stable Respiratory status: spontaneous breathing, nonlabored ventilation, respiratory function stable and patient connected to nasal cannula oxygen Cardiovascular status: blood pressure returned to baseline and stable Postop Assessment: no signs of nausea or vomiting Anesthetic complications: no       Last Vitals:  Vitals:   10/10/16 2055 10/10/16 2100  BP: (!) 141/92 (!) 141/99  Pulse: 90   Resp: 12   Temp:  36.5 C    Last Pain:  Vitals:   10/10/16 2100  TempSrc:   PainSc: 0-No pain                 Miguel Christiana DAVID

## 2016-10-11 NOTE — Op Note (Signed)
NAME:  Hays, Bradley                   ACCOUNT NO.:  MEDICAL RECORD NO.:  [id number]  LOCATION:                                 FACILITY:  PHYSICIAN:  Burnard Bunting, M.D.         DATE OF BIRTH:  DATE OF PROCEDURE: DATE OF DISCHARGE:                              OPERATIVE REPORT   PREOPERATIVE DIAGNOSES:  Left shoulder biceps tendon tear and superior labral tear.  POSTOPERATIVE DIAGNOSES:  Left shoulder biceps tendon tear and superior labral tear.  PROCEDURE:  Left shoulder arthroscopy with debridement of the superior labrum and some synovitis in the superior and anterior aspect of the shoulder along with open biceps tenodesis.  SURGEON:  Burnard Bunting, M.D.  ASSISTANT:  Patrick Jupiter, RNFA.  INDICATIONS:  Bradley Hays is a 46 year old patient with left shoulder pain, presents for operative management after sustaining left biceps tendon rupture about 2 weeks ago.  CT arthrogram negative for rotator cuff tear.  PROCEDURE IN DETAIL:  The patient was brought to the operating room where general anesthetic was induced.  Preoperative antibiotics were administered.  Time-out was called.  Left shoulder was examined under anesthesia.  The patient had full range of motion and good stability. The patient was placed in the beach chair position, head in neutral position.  Left arm then prescrubbed with alcohol and Betadine and allowed to air dry, prepped with DuraPrep solution and draped in sterile manner.  Time-out was called.  Posterior portal created 2 cm medial and inferior to the posterolateral margin of the acromion.  Diagnostic arthroscopy demonstrated superior labral tear with fraying and unstable labral fragments.  These were debrided after creating an anterior portal.  ArthroCare wand was utilized.  Other synovitis was also debrided, but the patient did not have a frozen shoulder.  Glenohumeral articular surfaces were intact and there was no full-thickness rotator cuff tear.  At  this time, instruments were removed after debridement of the superior labrum and synovitis.  Portals were closed using 3-0 nylon. Ioban used to cover the operative field entirely particularly around the biceps tendon.  Anterior incision was made along the mid humeral region. Skin and subcutaneous tissue were sharply divided.  The biceps tendon was identified and delivered into the incision.  The incision measured about 5 cm.  A FiberLoop suture was placed at the beginning of the muscle tendon junction and moving proximally about 4 cm.  Then, under appropriate tension, Arthrex Endobutton was used to place the biceps tendon into the bicipital groove near the bottom of the pectoral tendon. The subpectoral biceps tenodesis was stable and under appropriate tension.  Thorough irrigation was then performed.  That incision was then closed using 0 Vicryl suture, 2-0 Vicryl suture, and 3-0 Monocryl. Aquacel dressings placed over all incisions.  The patient tolerated the procedure well without immediate complications, transferred to the recovery room in stable condition.     Burnard Bunting, M.D.     GSD/MEDQ  D:  10/10/2016  T:  10/11/2016  Job:  [id number]

## 2016-10-11 NOTE — Telephone Encounter (Signed)
IC patient advised that he should not need prior auth b/c we got auth to cover for several months since he was having surgery.

## 2016-10-16 ENCOUNTER — Inpatient Hospital Stay (INDEPENDENT_AMBULATORY_CARE_PROVIDER_SITE_OTHER): Payer: Medicaid Other | Admitting: Orthopedic Surgery

## 2016-10-19 ENCOUNTER — Ambulatory Visit (INDEPENDENT_AMBULATORY_CARE_PROVIDER_SITE_OTHER): Payer: Medicaid Other | Admitting: Orthopedic Surgery

## 2016-10-19 ENCOUNTER — Encounter (INDEPENDENT_AMBULATORY_CARE_PROVIDER_SITE_OTHER): Payer: Self-pay | Admitting: Orthopedic Surgery

## 2016-10-19 DIAGNOSIS — Z9889 Other specified postprocedural states: Secondary | ICD-10-CM

## 2016-10-19 MED ORDER — OXYCODONE HCL 5 MG PO TABS: 5.0000 mg | ORAL_TABLET | Freq: Four times a day (QID) | ORAL | 0 refills | Status: DC | PRN

## 2016-10-19 MED ORDER — METHOCARBAMOL 500 MG PO TABS: 500.0000 mg | ORAL_TABLET | Freq: Three times a day (TID) | ORAL | 0 refills | Status: DC | PRN

## 2016-10-22 NOTE — Progress Notes (Signed)
Post-Op Visit Note   Patient: Bradley Hays           Date of Birth: 1971/04/05           MRN: [id number] Visit Date: 10/19/2016 PCP: No PCP Per Patient   Assessment & Plan:  Chief Complaint:  Chief Complaint  Patient presents with  . Left Shoulder - Routine Post Op   Visit Diagnoses:  1. S/P arthroscopy of left shoulder     Plan: Bradley Hays is a 46 year old patient is now about a week out left shoulder arthroscopy debridement biceps tenodesis.  Taking oxycodone for pain.  On the CPM machine about 3 times a day.  On exam is Pretty good passive range of motion biceps contour is restored.  Motor sensory function to the hand is intact.  On keeping him in the sling until May 1 2 weeks after that he can resume more activities.  After May 1 and before his next clinic visit he is okay to come out of the sling but do no lifting with that left arm.  Refill his oxycodone and muscle relaxer 1.  Insert transitioning him off higher strength opioid pain medicines when these run out  Follow-Up Instructions: Return in about 4 weeks (around 11/16/2016).   Orders:  No orders of the defined types were placed in this encounter.  Meds ordered this encounter  Medications  . oxyCODONE (OXY IR/ROXICODONE) 5 MG immediate release tablet    Sig: Take 1 tablet (5 mg total) by mouth every 6 (six) hours as needed for severe pain.    Dispense:  60 tablet    Refill:  0  . methocarbamol (ROBAXIN) 500 MG tablet    Sig: Take 1 tablet (500 mg total) by mouth every 8 (eight) hours as needed for muscle spasms.    Dispense:  30 tablet    Refill:  0    Imaging: No results found.  PMFS History: Patient Active Problem List   Diagnosis Date Noted  . Arthritis 03/29/2016  . Pain medication agreement 03/29/2016  . History of wrist fracture 03/01/2016  . Hx of long term use of blood thinners 03/01/2016  . Barrett's esophagus with dysplasia 02/01/2016  . Chronic pain of multiple joints 02/01/2016  . Coronary  artery disease involving native coronary artery of native heart with angina pectoris (HCC) 02/01/2016  . Generalized anxiety disorder 02/01/2016  . Nocturia more than twice per night 02/01/2016  . Psychophysiological insomnia 02/01/2016  . Abnormal EKG 01/31/2016  . Dyspnea on exertion 01/31/2016  . Easy fatigability 01/31/2016  . Family history of premature CAD 01/31/2016  . S/P coronary artery stent placement 01/31/2016  . Accelerated hypertension with heart disease and without congestive heart failure 01/15/2016  . COPD (chronic obstructive pulmonary disease) (HCC) 01/15/2016  . Headache 01/15/2016  . Preoperative cardiovascular examination 04/02/2014  . Closed fracture of distal ends of radius and ulna, bilateral 04/02/2014  . Distal radius fracture 04/02/2014  . Fatty liver 10/27/2011  . RUQ pain 10/27/2011  . Nausea 10/27/2011  . Hypertension   . Hyperlipidemia LDL goal <70   . Tobacco abuse   . Gastroesophageal reflux disease   . Cocaine abuse   . Arteriosclerotic cardiovascular disease (ASCVD) 11/10/2009   Past Medical History:  Diagnosis Date  . Arteriosclerotic cardiovascular disease (ASCVD) 11/10/09   Rothbart-BMS to circumflex posterior acute MI in the setting of cocaine use, peak CK of 3590, MB of 511 and troponin of 48; posterolateral hypokinesis with EF of 50%  and total obstruction of circumflex; [facility] in 09/2010 40 chest pain, d-dimer of 1.57 with negative CT; EF of 45% with negative stress echo  . Asthma   . Cocaine abuse    remote, quit 2010  . COPD (chronic obstructive pulmonary disease) (HCC)   . Gastroesophageal reflux disease   . Hyperlipidemia    lipid profile in 2011:185, 169, 37, 114; 2012: Total cholesterol of 311 and triglycerides of 1730  . Hypertension   . 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 Tobacco abuse    40 pack years; 0.5 PPD  . 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 Heart disease    .  Arthritis    . Lung disease    . Cancer    . Asthma    . Kidney disease    . Hypertension Mother   . Cancer Father   . Heart disease Father   . Hypertension Father     Past Surgical History:  Procedure Laterality Date  . APPENDECTOMY  2009  . CORONARY STENT PLACEMENT  2011   bare metal  . EYE SURGERY    . KNEE ARTHROSCOPY    . NISSEN FUNDOPLICATION  1992  . ORIF WRIST FRACTURE Bilateral 04/02/2014   Procedure: OPEN REDUCTION INTERNAL FIXATION (ORIF) WRIST FRACTURE AND CARPAL TUNNEL RELEASES;  Surgeon: Dominica Severin, MD;  Location: [facility] OR;  Service: Orthopedics;  Laterality: Bilateral;  . SHOULDER ARTHROSCOPY WITH DEBRIDEMENT AND BICEP TENDON REPAIR Left 10/10/2016   Procedure: LEFT SHOULDER ARTHROSCOPY WITH DEBRIDEMENT AND BICEPS TENODESIS;  Surgeon: Cammy Copa, MD;  Location: [facility] OR;  Service: Orthopedics;  Laterality: Left;   Social History   Occupational History  . unemployed x 1 yr, Personal assistant    Social History Main Topics  . Smoking status: Current Every Day Smoker    Packs/day: 0.50    Years: 26.00    Types: Cigarettes  . Smokeless tobacco: Never Used  . Alcohol use Yes     Comment: occasionally; reports not very often   . Drug use: No     Comment: remote cocaine, quit 2003  . Sexual activity: Not on file

## 2016-10-23 ENCOUNTER — Encounter (INDEPENDENT_AMBULATORY_CARE_PROVIDER_SITE_OTHER): Payer: Self-pay | Admitting: *Deleted

## 2016-10-24 ENCOUNTER — Ambulatory Visit: Admit: 2016-10-24 | Payer: Medicaid Other | Admitting: Orthopedic Surgery

## 2016-10-24 SURGERY — SHOULDER ARTHROSCOPY WITH DEBRIDEMENT AND BICEP TENDON REPAIR
Anesthesia: General | Site: Shoulder | Laterality: Left

## 2016-10-30 ENCOUNTER — Inpatient Hospital Stay (INDEPENDENT_AMBULATORY_CARE_PROVIDER_SITE_OTHER): Payer: Medicaid Other | Admitting: Orthopedic Surgery

## 2016-11-08 ENCOUNTER — Ambulatory Visit (INDEPENDENT_AMBULATORY_CARE_PROVIDER_SITE_OTHER): Payer: Medicaid Other | Admitting: Orthopedic Surgery

## 2016-11-23 ENCOUNTER — Emergency Department (HOSPITAL_COMMUNITY): Payer: Medicaid Other

## 2016-11-23 ENCOUNTER — Encounter (HOSPITAL_COMMUNITY): Payer: Self-pay | Admitting: Emergency Medicine

## 2016-11-23 ENCOUNTER — Emergency Department (HOSPITAL_COMMUNITY)
Admission: EM | Admit: 2016-11-23 | Discharge: 2016-11-23 | Disposition: A | Discharge status: Home / Self Care | Payer: Medicaid Other | Attending: Emergency Medicine | Admitting: Emergency Medicine

## 2016-11-23 DIAGNOSIS — Z79899 Other long term (current) drug therapy: Secondary | ICD-10-CM | POA: Insufficient documentation

## 2016-11-23 DIAGNOSIS — Z7982 Long term (current) use of aspirin: Secondary | ICD-10-CM | POA: Insufficient documentation

## 2016-11-23 DIAGNOSIS — I25119 Atherosclerotic heart disease of native coronary artery with unspecified angina pectoris: Secondary | ICD-10-CM | POA: Diagnosis not present

## 2016-11-23 DIAGNOSIS — F17210 Nicotine dependence, cigarettes, uncomplicated: Secondary | ICD-10-CM | POA: Insufficient documentation

## 2016-11-23 DIAGNOSIS — R079 Chest pain, unspecified: Secondary | ICD-10-CM | POA: Diagnosis present

## 2016-11-23 DIAGNOSIS — G44209 Tension-type headache, unspecified, not intractable: Secondary | ICD-10-CM | POA: Diagnosis not present

## 2016-11-23 DIAGNOSIS — I10 Essential (primary) hypertension: Secondary | ICD-10-CM | POA: Diagnosis not present

## 2016-11-23 DIAGNOSIS — R0789 Other chest pain: Secondary | ICD-10-CM | POA: Diagnosis not present

## 2016-11-23 DIAGNOSIS — J449 Chronic obstructive pulmonary disease, unspecified: Secondary | ICD-10-CM | POA: Insufficient documentation

## 2016-11-23 DIAGNOSIS — J45909 Unspecified asthma, uncomplicated: Secondary | ICD-10-CM | POA: Diagnosis not present

## 2016-11-23 LAB — CBC WITH DIFFERENTIAL/PLATELET
BASOS PCT: 0 %
Basophils Absolute: 0 10*3/uL (ref 0.0–0.1)
EOS ABS: 0.2 10*3/uL (ref 0.0–0.7)
EOS PCT: 2 %
HCT: 47.3 % (ref 39.0–52.0)
Hemoglobin: 16.3 g/dL (ref 13.0–17.0)
LYMPHS ABS: 1.8 10*3/uL (ref 0.7–4.0)
Lymphocytes Relative: 21 %
MCH: 30.1 pg (ref 26.0–34.0)
MCHC: 34.5 g/dL (ref 30.0–36.0)
MCV: 87.3 fL (ref 78.0–100.0)
MONO ABS: 0.8 10*3/uL (ref 0.1–1.0)
MONOS PCT: 9 %
Neutro Abs: 6 10*3/uL (ref 1.7–7.7)
Neutrophils Relative %: 68 %
PLATELETS: 212 10*3/uL (ref 150–400)
RBC: 5.42 MIL/uL (ref 4.22–5.81)
RDW: 13.4 % (ref 11.5–15.5)
WBC: 8.8 10*3/uL (ref 4.0–10.5)

## 2016-11-23 LAB — I-STAT TROPONIN, ED
TROPONIN I, POC: 0 ng/mL (ref 0.00–0.08)
TROPONIN I, POC: 0.01 ng/mL (ref 0.00–0.08)

## 2016-11-23 LAB — BASIC METABOLIC PANEL
Anion gap: 10 (ref 5–15)
BUN: 13 mg/dL (ref 6–20)
CALCIUM: 9 mg/dL (ref 8.9–10.3)
CO2: 25 mmol/L (ref 22–32)
Chloride: 102 mmol/L (ref 101–111)
Creatinine, Ser: 0.82 mg/dL (ref 0.61–1.24)
GFR calc Af Amer: >60 mL/min (ref >60)
GLUCOSE: 112 mg/dL — AB (ref 65–99)
Potassium: 3.9 mmol/L (ref 3.5–5.1)
SODIUM: 137 mmol/L (ref 135–145)

## 2016-11-23 MED ORDER — ASPIRIN 81 MG PO CHEW
324.0000 mg | CHEWABLE_TABLET | Freq: Once | ORAL | Status: AC
  Administered 2016-11-23: 324 mg via ORAL
  Filled 2016-11-23: qty 4

## 2016-11-23 MED ORDER — ACETAMINOPHEN 500 MG PO TABS
1000.0000 mg | ORAL_TABLET | Freq: Once | ORAL | Status: AC
  Administered 2016-11-23: 1000 mg via ORAL
  Filled 2016-11-23: qty 2

## 2016-11-23 MED ORDER — PROCHLORPERAZINE EDISYLATE 5 MG/ML IJ SOLN
10.0000 mg | Freq: Once | INTRAMUSCULAR | Status: AC
  Administered 2016-11-23: 10 mg via INTRAVENOUS
  Filled 2016-11-23: qty 2

## 2016-11-23 MED ORDER — NITROGLYCERIN 0.4 MG SL SUBL
0.4000 mg | SUBLINGUAL_TABLET | SUBLINGUAL | Status: DC | PRN
Start: 2016-11-23 — End: 2016-11-23
  Administered 2016-11-23: 0.4 mg via SUBLINGUAL
  Filled 2016-11-23: qty 1

## 2016-11-23 MED ORDER — DEXAMETHASONE SODIUM PHOSPHATE 10 MG/ML IJ SOLN
10.0000 mg | Freq: Once | INTRAMUSCULAR | Status: AC
  Administered 2016-11-23: 10 mg via INTRAVENOUS
  Filled 2016-11-23: qty 1

## 2016-11-23 MED ORDER — IPRATROPIUM-ALBUTEROL 0.5-2.5 (3) MG/3ML IN SOLN
3.0000 mL | Freq: Once | RESPIRATORY_TRACT | Status: AC
  Administered 2016-11-23: 3 mL via RESPIRATORY_TRACT
  Filled 2016-11-23: qty 3

## 2016-11-23 MED ORDER — ONDANSETRON HCL 4 MG/2ML IJ SOLN
4.0000 mg | Freq: Once | INTRAMUSCULAR | Status: AC
  Administered 2016-11-23: 4 mg via INTRAVENOUS
  Filled 2016-11-23: qty 2

## 2016-11-23 MED ORDER — MORPHINE SULFATE (PF) 4 MG/ML IV SOLN
4.0000 mg | Freq: Once | INTRAVENOUS | Status: AC
  Administered 2016-11-23: 4 mg via INTRAVENOUS
  Filled 2016-11-23: qty 1

## 2016-11-23 MED ORDER — DIPHENHYDRAMINE HCL 50 MG/ML IJ SOLN
25.0000 mg | Freq: Once | INTRAMUSCULAR | Status: AC
  Administered 2016-11-23: 25 mg via INTRAVENOUS
  Filled 2016-11-23: qty 1

## 2016-11-23 NOTE — ED Notes (Signed)
Pt states he has to leave now, he has some issues at home and he cant wait any longer. Explained to pt he is not up for discharge yet.   EDP in a procedure at this time.

## 2016-11-23 NOTE — ED Triage Notes (Signed)
Pt c/o chest tightness with sob since last night. ntg x 2 taken at home with no relief. Pt c/o dizziness and ha since yesterday with fall x 1 due to dizziness. Nondiaphoretic. Pa in with p tnow. Pt states his cough is chronic and no wrose

## 2016-11-23 NOTE — ED Notes (Signed)
Pt to xray

## 2016-11-23 NOTE — ED Notes (Signed)
EDP at bedside.  Explained to pt d/t his heart hx he needs to stay overnight in the hospital.  Pt states he has some issues at home and is unable to stay.  Agreed to have an istat troponin ran prior to d/c.

## 2016-11-23 NOTE — ED Provider Notes (Signed)
[facility] DEPT Provider Note   CSN: [id number] Arrival date & time: 11/23/16  0821     History   Chief Complaint Chief Complaint  Patient presents with  . Chest Pain    HPI Bradley Hays is a 46 y.o. male with a history of ASCVD with MI x 2 in the setting cocaine abuse (no longer uses), COPD, tobacco abuse, HTN, hyperlipidemia, presenting with constant, aching left sided chest pain which woke him around 11 pm last night, in association with increased sob (from his baseline), nausea and lightheadedness.  He took 2 of his ntg tablets an hour ago without relief of chest pain but now has a severe headache.  He denies focal weakness, confusion, has no abdominal pain.  He has run out of all his medications including his plavix, has taken no medicines in over 3 weeks.  He did take a goody powder this am without any improvement.  He was being followed by cardiology in [place], [place], last cardiac cath as copied below in August 2017, is scheduled to establish care with cardiology here in [place] in a few weeks.  HPI  Past Medical History:  Diagnosis Date  . 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 Asthma   . Cocaine abuse    remote, quit 2010  . COPD (chronic obstructive pulmonary disease) (HCC)   . Gastroesophageal reflux disease   . Hyperlipidemia    lipid profile in 2011:185, 169, 37, 114; 2012: Total cholesterol of 311 and triglycerides of 1730  . Hypertension   . 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 Tobacco abuse    40 pack years; 0.5 PPD  . 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 Arthritis  03/29/2016  . Pain medication agreement 03/29/2016  . History of wrist fracture 03/01/2016  . Hx of long term use of blood thinners 03/01/2016  . Barrett's esophagus with dysplasia 02/01/2016  . Chronic pain of multiple joints 02/01/2016  . Coronary artery disease involving native coronary artery of native heart with angina pectoris (HCC) 02/01/2016  . Generalized anxiety disorder 02/01/2016  . Nocturia more than twice per night 02/01/2016  . Psychophysiological insomnia 02/01/2016  . Abnormal EKG 01/31/2016  . Dyspnea on exertion 01/31/2016  . Easy fatigability 01/31/2016  . Family history of premature CAD 01/31/2016  . S/P coronary artery stent placement 01/31/2016  . Accelerated hypertension with heart disease and without congestive heart failure 01/15/2016  . COPD (chronic obstructive pulmonary disease) (HCC) 01/15/2016  . Headache 01/15/2016  . Preoperative cardiovascular examination 04/02/2014  . Closed fracture of distal ends of radius and ulna, bilateral 04/02/2014  . Distal radius fracture 04/02/2014  . Fatty liver 10/27/2011  . RUQ pain 10/27/2011  . Nausea 10/27/2011  . Hypertension   . Hyperlipidemia LDL goal <70   . Tobacco abuse   . Gastroesophageal reflux disease   . Cocaine abuse   . Arteriosclerotic cardiovascular disease (ASCVD) 11/10/2009    Past Surgical History:  Procedure Laterality Date  . APPENDECTOMY  2009  . CORONARY STENT PLACEMENT  2011  bare metal  . EYE SURGERY    . KNEE ARTHROSCOPY    . NISSEN FUNDOPLICATION  1992  . ORIF WRIST FRACTURE Bilateral 04/02/2014   Procedure: OPEN REDUCTION INTERNAL FIXATION (ORIF) WRIST FRACTURE AND CARPAL TUNNEL RELEASES;  Surgeon: Dominica Severin, MD;  Location: [facility] OR;  Service: Orthopedics;  Laterality: Bilateral;  . SHOULDER ARTHROSCOPY WITH DEBRIDEMENT AND BICEP TENDON REPAIR Left 10/10/2016   Procedure: LEFT SHOULDER ARTHROSCOPY WITH DEBRIDEMENT AND BICEPS TENODESIS;  Surgeon: Cammy Copa, MD;  Location:  [facility] OR;  Service: Orthopedics;  Laterality: Left;       Home Medications    Prior to Admission medications   Medication Sig Start Date End Date Taking? Authorizing Provider  albuterol (PROVENTIL HFA;VENTOLIN HFA) 108 (90 Base) MCG/ACT inhaler Inhale 2 puffs into the lungs every 6 (six) hours as needed for wheezing or shortness of breath.  09/19/16 09/19/17 Yes [provider]  aspirin EC 81 MG tablet Take 81 mg by mouth daily. 01/16/16  Yes [provider]  Aspirin-Acetaminophen-Caffeine (GOODY HEADACHE PO) Take 1 packet by mouth daily as needed (headache).   Yes [provider]  atorvastatin (LIPITOR) 40 MG tablet Take 1 tablet (40 mg total) by mouth daily. Patient taking differently: Take 40 mg by mouth every evening.  09/29/16 12/28/16 Yes Strader, Lennart Pall, PA-C  lisinopril (PRINIVIL,ZESTRIL) 10 MG tablet Take 1 tablet (10 mg total) by mouth daily. 09/29/16  Yes Strader, [place] M, PA-C  metoprolol tartrate (LOPRESSOR) 25 MG tablet Take 1 tablet (25 mg total) by mouth 2 (two) times daily. 09/29/16  Yes Strader, [place] M, PA-C  nitroGLYCERIN (NITROSTAT) 0.4 MG SL tablet Place 1 tablet (0.4 mg total) under the tongue as needed. Patient taking differently: Place 0.4 mg under the tongue every 5 (five) minutes as needed for chest pain.  09/29/16  Yes Strader, [place] M, PA-C  QUEtiapine (SEROQUEL) 50 MG tablet Take 50 mg by mouth at bedtime. 02/21/16  Yes [provider]  clopidogrel (PLAVIX) 75 MG tablet Take 1 tablet (75 mg total) by mouth daily. Patient not taking: Reported on 11/23/2016 09/29/16 09/29/17  Iran Ouch, Lennart Pall, PA-C  methocarbamol (ROBAXIN) 500 MG tablet 1 po q 8hrs prn pain Patient not taking: Reported on 11/23/2016 10/03/16   Cammy Copa, MD  methocarbamol (ROBAXIN) 500 MG tablet Take 1 tablet (500 mg total) by mouth every 8 (eight) hours as needed for muscle spasms. Patient not taking: Reported on 11/23/2016 10/19/16   Cammy Copa, MD    oxyCODONE (OXY IR/ROXICODONE) 5 MG immediate release tablet Take 1 tablet (5 mg total) by mouth every 6 (six) hours as needed for severe pain. Patient not taking: Reported on 11/23/2016 10/19/16   Cammy Copa, MD  oxycodone (OXY-IR) 5 MG capsule 1-2 po q 6hrs prn pain Patient not taking: Reported on 11/23/2016 10/03/16   Cammy Copa, MD    Family History Family History  Problem Relation Age of Onset  . Heart disease Unknown   . Arthritis Unknown   . Lung disease Unknown   . Cancer Unknown   . Asthma Unknown   . Kidney disease Unknown   . Hypertension Mother   . Cancer Father   . Heart disease Father   . Hypertension Father     Social History Social History  Substance Use Topics  . Smoking status: Current Every Day Smoker    Packs/day: 0.50    Years: 26.00    Types: Cigarettes  . Smokeless tobacco: Never  Used  . Alcohol use Yes     Comment: occasionally; reports not very often      Allergies   No known allergies   Review of Systems Review of Systems  Constitutional: Negative for fever.  HENT: Negative for congestion and sore throat.   Eyes: Negative.   Respiratory: Positive for chest tightness and shortness of breath.   Cardiovascular: Positive for chest pain.  Gastrointestinal: Positive for nausea. Negative for abdominal pain.  Genitourinary: Negative.   Musculoskeletal: Negative for arthralgias, joint swelling and neck pain.  Skin: Negative.  Negative for rash and wound.  Neurological: Positive for light-headedness and headaches. Negative for dizziness, weakness and numbness.  Psychiatric/Behavioral: Negative.      Physical Exam Updated Vital Signs BP 127/81   Pulse 74   Temp 98.1 F (36.7 C) (Oral)   Resp 18   SpO2 91%   Physical Exam  Constitutional: He appears well-developed and well-nourished.  Appears uncomfortable  HENT:  Head: Normocephalic and atraumatic.  Mouth/Throat: Oropharynx is clear and moist.  Eyes: Conjunctivae are  normal.  Neck: Normal range of motion.  Cardiovascular: Normal rate, regular rhythm, normal heart sounds and intact distal pulses.   Pulmonary/Chest: Effort normal. He has wheezes.  Occasional expiratory wheeze.  Frequent cough.  Abdominal: Soft. Bowel sounds are normal. There is no tenderness.  Musculoskeletal: Normal range of motion. He exhibits no edema.  Neurological: He is alert.  Skin: Skin is warm and dry.  Psychiatric: He has a normal mood and affect.  Nursing note and vitals reviewed.    ED Treatments / Results  Labs (all labs ordered are listed, but only abnormal results are displayed)  Results for orders placed or performed during the hospital encounter of 11/23/16  Basic metabolic panel  Result Value Ref Range   Sodium 137 135 - 145 mmol/L   Potassium 3.9 3.5 - 5.1 mmol/L   Chloride 102 101 - 111 mmol/L   CO2 25 22 - 32 mmol/L   Glucose, Bld 112 (H) 65 - 99 mg/dL   BUN 13 6 - 20 mg/dL   Creatinine, Ser 1.61 0.61 - 1.24 mg/dL   Calcium 9.0 8.9 - 09.6 mg/dL   GFR calc non Af Amer >60 >60 mL/min   GFR calc Af Amer >60 >60 mL/min   Anion gap 10 5 - 15  CBC with Differential  Result Value Ref Range   WBC 8.8 4.0 - 10.5 K/uL   RBC 5.42 4.22 - 5.81 MIL/uL   Hemoglobin 16.3 13.0 - 17.0 g/dL   HCT 04.5 40.9 - 81.1 %   MCV 87.3 78.0 - 100.0 fL   MCH 30.1 26.0 - 34.0 pg   MCHC 34.5 30.0 - 36.0 g/dL   RDW 91.4 78.2 - 95.6 %   Platelets 212 150 - 400 K/uL   Neutrophils Relative % 68 %   Neutro Abs 6.0 1.7 - 7.7 K/uL   Lymphocytes Relative 21 %   Lymphs Abs 1.8 0.7 - 4.0 K/uL   Monocytes Relative 9 %   Monocytes Absolute 0.8 0.1 - 1.0 K/uL   Eosinophils Relative 2 %   Eosinophils Absolute 0.2 0.0 - 0.7 K/uL   Basophils Relative 0 %   Basophils Absolute 0.0 0.0 - 0.1 K/uL  I-stat troponin, ED  Result Value Ref Range   Troponin i, poc 0.00 0.00 - 0.08 ng/mL   Comment 3          I-stat troponin, ED  Result Value Ref Range  Troponin i, poc 0.01 0.00 - 0.08 ng/mL     Comment 3              EKG  EKG Interpretation  Date/Time:  Thursday Nov 23 2016 08:31:27 EDT Ventricular Rate:  80 PR Interval:    QRS Duration: 106 QT Interval:  372 QTC Calculation: 430 R Axis:   65 Text Interpretation:  Sinus rhythm no acute ST/T changes no significant change compared to 2016 Confirmed by Pricilla Loveless [phone]) on 11/23/2016 8:44:48 AM       Radiology Dg Chest 2 View  Result Date: 11/23/2016 CLINICAL DATA:  Central chest pain EXAM: CHEST  2 VIEW COMPARISON:  02/06/2015 FINDINGS: Normal heart size and mediastinal contours. No acute infiltrate or edema. No effusion or pneumothorax. No acute osseous findings. IMPRESSION: Negative chest. Electronically Signed   By: Marnee Spring M.D.   On: 11/23/2016 10:07   Ct Head Wo Contrast  Result Date: 11/23/2016 CLINICAL DATA:  Dizziness for 1 day EXAM: CT HEAD WITHOUT CONTRAST TECHNIQUE: Contiguous axial images were obtained from the base of the skull through the vertex without intravenous contrast. COMPARISON:  10/18/2013 FINDINGS: Brain: No evidence of acute infarction, hemorrhage, hydrocephalus, extra-axial collection or mass lesion/mass effect. Vascular: No hyperdense vessel or unexpected calcification. Skull: Normal. Negative for fracture or focal lesion. Sinuses/Orbits: Radiopaque foreign body is again noted in the posterior aspect of the right orbit stable from the prior exam. Other: None. IMPRESSION: No acute intracranial abnormality is noted. Stable foreign body in the posterior aspect of the right orbit. Electronically Signed   By: Alcide Clever M.D.   On: 11/23/2016 10:31    Procedures Procedures (including critical care time)  Medications Ordered in ED Medications  aspirin chewable tablet 324 mg (324 mg Oral Given 11/23/16 0918)  morphine 4 MG/ML injection 4 mg (4 mg Intravenous Given 11/23/16 0919)  ondansetron (ZOFRAN) injection 4 mg (4 mg Intravenous Given 11/23/16 0916)  acetaminophen (TYLENOL) tablet  1,000 mg (1,000 mg Oral Given 11/23/16 0924)  ipratropium-albuterol (DUONEB) 0.5-2.5 (3) MG/3ML nebulizer solution 3 mL (3 mLs Nebulization Given 11/23/16 1106)  prochlorperazine (COMPAZINE) injection 10 mg (10 mg Intravenous Given 11/23/16 1013)  diphenhydrAMINE (BENADRYL) injection 25 mg (25 mg Intravenous Given 11/23/16 1010)  dexamethasone (DECADRON) injection 10 mg (10 mg Intravenous Given 11/23/16 1006)     Initial Impression / Assessment and Plan / ED Course  I have reviewed the triage vital signs and the nursing notes.  Pertinent labs & imaging results that were available during my care of the patient were reviewed by me and considered in my medical decision making (see chart for details).     10:02 PM Recheck of pt,  Cp better, not resolved, persistent cough, sparse wheezing, will add neb tx.  First troponin neg.  C/o persistent headache,  States has "kept a headache" for at least a week.  Reports had a syncopal event one week ago, fell and hit his head.  Hasn't felt well since.  States was asymptomatic prior to syncope. Will give migraine cocktail, order head CT to r/o intracranial injury.  Ct negative for acute intracranial injury.  Given migraine cocktail, now headache 4/10, tolerable.  Advised admission due to cardiac risk factors.  Pt refuses, stating "it's complicated but I have things to do". He did agree to stay for a 3 hour delta trop.    Second troponin with slight bump at 0.01 from 0.00 but still negative range more than 14 hours out from onset  of chest pain . By the time the second troponin had resulted, pt had eloped.  Final Clinical Impressions(s) / ED Diagnoses   Final diagnoses:  Other chest pain  Acute non intractable tension-type headache    New Prescriptions Discharge Medication List as of 11/23/2016 12:24 PM       Burgess Amordol, Leanny Moeckel, PA-C 11/23/16 2202    Pricilla LovelessGoldston, Scott, MD 11/30/16 [phone]

## 2016-12-06 ENCOUNTER — Encounter: Payer: Self-pay | Admitting: Cardiology

## 2016-12-06 ENCOUNTER — Ambulatory Visit: Payer: Medicaid Other | Admitting: Cardiology

## 2016-12-06 NOTE — Progress Notes (Deleted)
Clinical Summary Mr. Legler is a 46 y.o.male last seen by PA Iran Ouch, this is our first visit together.  1. CAD - history of BMS to LCX in 2012 - cath 01/2016 at outside hospital, notes indicate some ISR of LCX stent without significant other disease that looked to be overall moderate, recs for medical therapy. LVgram 40-45%   2. ICM   3. Cocaine abuse   4. COPD   5. HTN   6. Hyperlipidemia     Past Medical History:  Diagnosis Date  . 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 Asthma   . Cocaine abuse    remote, quit 2010  . COPD (chronic obstructive pulmonary disease) (HCC)   . Gastroesophageal reflux disease   . Hyperlipidemia    lipid profile in 2011:185, 169, 37, 114; 2012: Total cholesterol of 311 and triglycerides of 1730  . Hypertension   . 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 Tobacco abuse    40 pack years; 0.5 PPD  . Trauma to eye    Retained projectile on left     Allergies  Allergen Reactions  . No Known Allergies      Current Outpatient Prescriptions  Medication Sig Dispense Refill  . albuterol (PROVENTIL HFA;VENTOLIN HFA) 108 (90 Base) MCG/ACT inhaler Inhale 2 puffs into the lungs every 6 (six) hours as needed for wheezing or shortness of breath.     Marland Kitchen aspirin EC 81 MG tablet Take 81 mg by mouth daily.    . Aspirin-Acetaminophen-Caffeine (GOODY HEADACHE PO) Take 1 packet by mouth daily as needed (headache).    Marland Kitchen atorvastatin (LIPITOR) 40 MG tablet Take 1 tablet (40 mg total) by mouth daily. (Patient taking differently: Take 40 mg by mouth every evening. ) 90 tablet 3  . clopidogrel (PLAVIX) 75 MG tablet Take 1 tablet (75 mg  total) by mouth daily. (Patient not taking: Reported on 11/23/2016) 90 tablet 3  . lisinopril (PRINIVIL,ZESTRIL) 10 MG tablet Take 1 tablet (10 mg total) by mouth daily. 90 tablet 3  . methocarbamol (ROBAXIN) 500 MG tablet 1 po q 8hrs prn pain (Patient not taking: Reported on 11/23/2016) 30 tablet 0  . methocarbamol (ROBAXIN) 500 MG tablet Take 1 tablet (500 mg total) by mouth every 8 (eight) hours as needed for muscle spasms. (Patient not taking: Reported on 11/23/2016) 30 tablet 0  . metoprolol tartrate (LOPRESSOR) 25 MG tablet Take 1 tablet (25 mg total) by mouth 2 (two) times daily. 180 tablet 3  . nitroGLYCERIN (NITROSTAT) 0.4 MG SL tablet Place 1 tablet (0.4 mg total) under the tongue as needed. (Patient taking differently: Place 0.4 mg under the tongue every 5 (five) minutes as needed for chest pain. ) 25 tablet 3  . oxyCODONE (OXY IR/ROXICODONE) 5 MG immediate release tablet Take 1 tablet (5 mg total) by mouth every 6 (six) hours as needed for severe pain. (Patient not taking: Reported on 11/23/2016) 60 tablet 0  . oxycodone (OXY-IR) 5 MG capsule 1-2 po q 6hrs prn pain (Patient not taking: Reported on 11/23/2016) 60 capsule 0  . QUEtiapine (SEROQUEL) 50 MG tablet Take 50 mg by mouth at  bedtime.     No current facility-administered medications for this visit.      Past Surgical History:  Procedure Laterality Date  . APPENDECTOMY  2009  . CORONARY STENT PLACEMENT  2011   bare metal  . EYE SURGERY    . KNEE ARTHROSCOPY    . NISSEN FUNDOPLICATION  1992  . ORIF WRIST FRACTURE Bilateral 04/02/2014   Procedure: OPEN REDUCTION INTERNAL FIXATION (ORIF) WRIST FRACTURE AND CARPAL TUNNEL RELEASES;  Surgeon: Dominica Severin, MD;  Location: [facility] OR;  Service: Orthopedics;  Laterality: Bilateral;  . SHOULDER ARTHROSCOPY WITH DEBRIDEMENT AND BICEP TENDON REPAIR Left 10/10/2016   Procedure: LEFT SHOULDER ARTHROSCOPY WITH DEBRIDEMENT AND BICEPS TENODESIS;  Surgeon: Cammy Copa, MD;  Location: [facility] OR;   Service: Orthopedics;  Laterality: Left;     Allergies  Allergen Reactions  . No Known Allergies       Family History  Problem Relation Age of Onset  . Heart disease Unknown   . Arthritis Unknown   . Lung disease Unknown   . Cancer Unknown   . Asthma Unknown   . Kidney disease Unknown   . Hypertension Mother   . Cancer Father   . Heart disease Father   . Hypertension Father      Social History Mr. Borgen reports that he has been smoking Cigarettes.  He has a 13.00 pack-year smoking history. He has never used smokeless tobacco. Mr. Knoke reports that he drinks alcohol.   Review of Systems CONSTITUTIONAL: No weight loss, fever, chills, weakness or fatigue.  HEENT: Eyes: No visual loss, blurred vision, double vision or yellow sclerae.No hearing loss, sneezing, congestion, runny nose or sore throat.  SKIN: No rash or itching.  CARDIOVASCULAR:  RESPIRATORY: No shortness of breath, cough or sputum.  GASTROINTESTINAL: No anorexia, nausea, vomiting or diarrhea. No abdominal pain or blood.  GENITOURINARY: No burning on urination, no polyuria NEUROLOGICAL: No headache, dizziness, syncope, paralysis, ataxia, numbness or tingling in the extremities. No change in bowel or bladder control.  MUSCULOSKELETAL: No muscle, back pain, joint pain or stiffness.  LYMPHATICS: No enlarged nodes. No history of splenectomy.  PSYCHIATRIC: No history of depression or anxiety.  ENDOCRINOLOGIC: No reports of sweating, cold or heat intolerance. No polyuria or polydipsia.  Marland Kitchen   Physical Examination There were no vitals filed for this visit. There were no vitals filed for this visit.  Gen: resting comfortably, no acute distress HEENT: no scleral icterus, pupils equal round and reactive, no palptable cervical adenopathy,  CV Resp: Clear to auscultation bilaterally GI: abdomen is soft, non-tender, non-distended, normal bowel sounds, no hepatosplenomegaly MSK: extremities are warm, no edema.    Skin: warm, no rash Neuro:  no focal deficits Psych: appropriate affect   Diagnostic Studies Echocardiogram: 08/2011 Study Conclusions  - Left ventricle: The cavity size was normal. Wall thickness was increased in a pattern of mild LVH. Systolic function was normal. The estimated ejection fraction was in the range of 50% to 55%. There is akinesis and scarring of the basal-mid posterolateral myocardium. Features are consistent with a pseudonormal left ventricular filling pattern, with concomitant abnormal relaxation and increased filling pressure (grade 2 diastolic dysfunction). - Mitral valve: Trivial regurgitation. - Tricuspid valve: Trivial regurgitation. - Pericardium, extracardiac: There was no pericardial effusion.  Cardiac Catheterization: 01/2016 (From Care Everywhere) 1. Mild/moderately abnormal structure and function of left ventricle (in   setting of trace mitral valve regurgitation) characterized by: (a)   mild/moderate dilatation; (b) severe hypokinesis of basal-to-mid segment  of inferior wall; otherwise essentially normal systolic function; (c)   estimated ejection fraction 40-45%; (d) normal filling pressures (which   increase mildly abnormally following atrial contraction).    2. Significant, multi-vessel atherosclerotic coronary artery disease   characterized by: (a) sequential moderate and high grade "intra-stent"   restenoses within mid and (small diameter) distal segments of left   circumflex coronary artery (note: diameter of distal segment actually   matches diameter of low obtuse marginal coronary arteries, which are small   diameter; thus, physiologic significance unclear); (b) low/moderate grade   stenosis within mid segment of right coronary artery.    Post cath PLAN:    1. 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 Continue effort at complete cessation of cigarette smoking;    3. For inadequate clinical response to (1)-(2), consider po Ranolazine   and/or external enhanced counter-pulsation therapy.    Assessment and Plan  1. CAD -       Antoine PocheJonathan F. Cheryllynn Sarff, M.D., F.A.C.C.

## 2016-12-17 ENCOUNTER — Emergency Department (HOSPITAL_COMMUNITY): Payer: Medicaid Other

## 2016-12-17 ENCOUNTER — Inpatient Hospital Stay (HOSPITAL_COMMUNITY)
Admission: EM | Admit: 2016-12-17 | Discharge: 2016-12-18 | DRG: 191 | Disposition: A | Discharge status: Home / Self Care | Payer: Medicaid Other | Attending: Internal Medicine | Admitting: Internal Medicine

## 2016-12-17 ENCOUNTER — Encounter (HOSPITAL_COMMUNITY): Payer: Self-pay | Admitting: Cardiology

## 2016-12-17 DIAGNOSIS — Z955 Presence of coronary angioplasty implant and graft: Secondary | ICD-10-CM | POA: Diagnosis not present

## 2016-12-17 DIAGNOSIS — I110 Hypertensive heart disease with heart failure: Secondary | ICD-10-CM | POA: Diagnosis present

## 2016-12-17 DIAGNOSIS — Z23 Encounter for immunization: Secondary | ICD-10-CM

## 2016-12-17 DIAGNOSIS — I5022 Chronic systolic (congestive) heart failure: Secondary | ICD-10-CM | POA: Diagnosis not present

## 2016-12-17 DIAGNOSIS — J441 Chronic obstructive pulmonary disease with (acute) exacerbation: Principal | ICD-10-CM

## 2016-12-17 DIAGNOSIS — J438 Other emphysema: Secondary | ICD-10-CM

## 2016-12-17 DIAGNOSIS — Z7982 Long term (current) use of aspirin: Secondary | ICD-10-CM

## 2016-12-17 DIAGNOSIS — F17210 Nicotine dependence, cigarettes, uncomplicated: Secondary | ICD-10-CM | POA: Diagnosis present

## 2016-12-17 DIAGNOSIS — R55 Syncope and collapse: Secondary | ICD-10-CM

## 2016-12-17 DIAGNOSIS — I10 Essential (primary) hypertension: Secondary | ICD-10-CM | POA: Diagnosis not present

## 2016-12-17 DIAGNOSIS — Z7902 Long term (current) use of antithrombotics/antiplatelets: Secondary | ICD-10-CM | POA: Diagnosis not present

## 2016-12-17 DIAGNOSIS — Z79899 Other long term (current) drug therapy: Secondary | ICD-10-CM | POA: Diagnosis not present

## 2016-12-17 DIAGNOSIS — J449 Chronic obstructive pulmonary disease, unspecified: Secondary | ICD-10-CM | POA: Diagnosis present

## 2016-12-17 DIAGNOSIS — Z87898 Personal history of other specified conditions: Secondary | ICD-10-CM | POA: Diagnosis not present

## 2016-12-17 DIAGNOSIS — R0609 Other forms of dyspnea: Secondary | ICD-10-CM

## 2016-12-17 DIAGNOSIS — F1411 Cocaine abuse, in remission: Secondary | ICD-10-CM

## 2016-12-17 DIAGNOSIS — F1410 Cocaine abuse, uncomplicated: Secondary | ICD-10-CM | POA: Diagnosis present

## 2016-12-17 DIAGNOSIS — I252 Old myocardial infarction: Secondary | ICD-10-CM | POA: Diagnosis not present

## 2016-12-17 DIAGNOSIS — R079 Chest pain, unspecified: Secondary | ICD-10-CM | POA: Diagnosis not present

## 2016-12-17 DIAGNOSIS — Z720 Tobacco use: Secondary | ICD-10-CM | POA: Diagnosis not present

## 2016-12-17 DIAGNOSIS — I2510 Atherosclerotic heart disease of native coronary artery without angina pectoris: Secondary | ICD-10-CM | POA: Diagnosis present

## 2016-12-17 DIAGNOSIS — J45901 Unspecified asthma with (acute) exacerbation: Secondary | ICD-10-CM | POA: Diagnosis not present

## 2016-12-17 DIAGNOSIS — Z9114 Patient's other noncompliance with medication regimen: Secondary | ICD-10-CM

## 2016-12-17 DIAGNOSIS — K219 Gastro-esophageal reflux disease without esophagitis: Secondary | ICD-10-CM | POA: Diagnosis present

## 2016-12-17 LAB — DIFFERENTIAL
BASOS PCT: 0 %
Basophils Absolute: 0 10*3/uL (ref 0.0–0.1)
EOS ABS: 0.2 10*3/uL (ref 0.0–0.7)
Eosinophils Relative: 2 %
LYMPHS PCT: 28 %
Lymphs Abs: 2.8 10*3/uL (ref 0.7–4.0)
Monocytes Absolute: 0.7 10*3/uL (ref 0.1–1.0)
Monocytes Relative: 7 %
NEUTROS ABS: 6.4 10*3/uL (ref 1.7–7.7)
NEUTROS PCT: 63 %

## 2016-12-17 LAB — BASIC METABOLIC PANEL
ANION GAP: 10 (ref 5–15)
BUN: 15 mg/dL (ref 6–20)
CO2: 25 mmol/L (ref 22–32)
Calcium: 9.3 mg/dL (ref 8.9–10.3)
Chloride: 108 mmol/L (ref 101–111)
Creatinine, Ser: 0.99 mg/dL (ref 0.61–1.24)
GFR calc Af Amer: >60 mL/min (ref >60)
GLUCOSE: 143 mg/dL — AB (ref 65–99)
POTASSIUM: 3.7 mmol/L (ref 3.5–5.1)
Sodium: 143 mmol/L (ref 135–145)

## 2016-12-17 LAB — CBC
HEMATOCRIT: 45.1 % (ref 39.0–52.0)
HEMOGLOBIN: 15.4 g/dL (ref 13.0–17.0)
MCH: 30.3 pg (ref 26.0–34.0)
MCHC: 34.1 g/dL (ref 30.0–36.0)
MCV: 88.8 fL (ref 78.0–100.0)
Platelets: 221 10*3/uL (ref 150–400)
RBC: 5.08 MIL/uL (ref 4.22–5.81)
RDW: 13.6 % (ref 11.5–15.5)
WBC: 10.1 10*3/uL (ref 4.0–10.5)

## 2016-12-17 LAB — I-STAT TROPONIN, ED
TROPONIN I, POC: 0 ng/mL (ref 0.00–0.08)
Troponin i, poc: 0 ng/mL (ref 0.00–0.08)

## 2016-12-17 MED ORDER — QUETIAPINE FUMARATE 25 MG PO TABS
50.0000 mg | ORAL_TABLET | Freq: Every day | ORAL | Status: DC
  Administered 2016-12-17: 50 mg via ORAL
  Filled 2016-12-17: qty 2

## 2016-12-17 MED ORDER — ENOXAPARIN SODIUM 60 MG/0.6ML ~~LOC~~ SOLN
50.0000 mg | SUBCUTANEOUS | Status: DC
  Administered 2016-12-17: 50 mg via SUBCUTANEOUS
  Filled 2016-12-17: qty 0.6

## 2016-12-17 MED ORDER — METOPROLOL TARTRATE 25 MG PO TABS
25.0000 mg | ORAL_TABLET | Freq: Two times a day (BID) | ORAL | Status: DC
  Administered 2016-12-17 – 2016-12-18 (×2): 25 mg via ORAL
  Filled 2016-12-17 (×2): qty 1

## 2016-12-17 MED ORDER — MORPHINE SULFATE (PF) 4 MG/ML IV SOLN
4.0000 mg | INTRAVENOUS | Status: DC | PRN
  Administered 2016-12-17: 4 mg via INTRAVENOUS
  Filled 2016-12-17: qty 1

## 2016-12-17 MED ORDER — ALBUTEROL SULFATE (2.5 MG/3ML) 0.083% IN NEBU
2.5000 mg | INHALATION_SOLUTION | Freq: Once | RESPIRATORY_TRACT | Status: AC
  Administered 2016-12-17: 2.5 mg via RESPIRATORY_TRACT
  Filled 2016-12-17: qty 3

## 2016-12-17 MED ORDER — IPRATROPIUM-ALBUTEROL 0.5-2.5 (3) MG/3ML IN SOLN
3.0000 mL | Freq: Once | RESPIRATORY_TRACT | Status: AC
  Administered 2016-12-17: 3 mL via RESPIRATORY_TRACT
  Filled 2016-12-17: qty 3

## 2016-12-17 MED ORDER — SODIUM CHLORIDE 0.9% FLUSH
3.0000 mL | Freq: Two times a day (BID) | INTRAVENOUS | Status: DC
  Administered 2016-12-17 – 2016-12-18 (×2): 3 mL via INTRAVENOUS

## 2016-12-17 MED ORDER — NITROGLYCERIN 0.4 MG SL SUBL
0.4000 mg | SUBLINGUAL_TABLET | SUBLINGUAL | Status: DC | PRN
Start: 2016-12-17 — End: 2016-12-17
  Administered 2016-12-17 (×2): 0.4 mg via SUBLINGUAL
  Filled 2016-12-17: qty 1

## 2016-12-17 MED ORDER — SODIUM CHLORIDE 0.9 % IV SOLN: 250.0000 mL | INTRAVENOUS | Status: DC | PRN

## 2016-12-17 MED ORDER — IPRATROPIUM-ALBUTEROL 0.5-2.5 (3) MG/3ML IN SOLN
3.0000 mL | Freq: Three times a day (TID) | RESPIRATORY_TRACT | Status: DC
  Administered 2016-12-18 (×2): 3 mL via RESPIRATORY_TRACT
  Filled 2016-12-17 (×2): qty 3

## 2016-12-17 MED ORDER — ACETAMINOPHEN 650 MG RE SUPP: 650.0000 mg | Freq: Four times a day (QID) | RECTAL | Status: DC | PRN

## 2016-12-17 MED ORDER — POLYETHYLENE GLYCOL 3350 17 G PO PACK: 17.0000 g | PACK | Freq: Every day | ORAL | Status: DC | PRN

## 2016-12-17 MED ORDER — ASPIRIN 81 MG PO CHEW
324.0000 mg | CHEWABLE_TABLET | Freq: Once | ORAL | Status: AC
  Administered 2016-12-17: 324 mg via ORAL
  Filled 2016-12-17: qty 4

## 2016-12-17 MED ORDER — HYDROCODONE-ACETAMINOPHEN 5-325 MG PO TABS
1.0000 | ORAL_TABLET | ORAL | Status: DC | PRN
  Administered 2016-12-17 – 2016-12-18 (×2): 2 via ORAL
  Filled 2016-12-17 (×2): qty 2

## 2016-12-17 MED ORDER — SODIUM CHLORIDE 0.9% FLUSH: 3.0000 mL | INTRAVENOUS | Status: DC | PRN

## 2016-12-17 MED ORDER — IPRATROPIUM-ALBUTEROL 0.5-2.5 (3) MG/3ML IN SOLN: 3.0000 mL | Freq: Four times a day (QID) | RESPIRATORY_TRACT | Status: DC

## 2016-12-17 MED ORDER — CLOPIDOGREL BISULFATE 75 MG PO TABS
75.0000 mg | ORAL_TABLET | Freq: Every day | ORAL | Status: DC
  Administered 2016-12-17 – 2016-12-18 (×2): 75 mg via ORAL
  Filled 2016-12-17 (×2): qty 1

## 2016-12-17 MED ORDER — ATORVASTATIN CALCIUM 40 MG PO TABS
40.0000 mg | ORAL_TABLET | Freq: Every day | ORAL | Status: DC
Start: 2016-12-17 — End: 2016-12-18
  Administered 2016-12-17 – 2016-12-18 (×2): 40 mg via ORAL
  Filled 2016-12-17 (×2): qty 1

## 2016-12-17 MED ORDER — PREDNISONE 20 MG PO TABS
40.0000 mg | ORAL_TABLET | Freq: Every day | ORAL | Status: DC
  Administered 2016-12-18: 40 mg via ORAL
  Filled 2016-12-17: qty 2

## 2016-12-17 MED ORDER — METHYLPREDNISOLONE SODIUM SUCC 125 MG IJ SOLR
125.0000 mg | Freq: Once | INTRAMUSCULAR | Status: AC
  Administered 2016-12-17: 125 mg via INTRAVENOUS
  Filled 2016-12-17: qty 2

## 2016-12-17 MED ORDER — ASPIRIN EC 81 MG PO TBEC
81.0000 mg | DELAYED_RELEASE_TABLET | Freq: Every day | ORAL | Status: DC
  Administered 2016-12-18: 81 mg via ORAL
  Filled 2016-12-17: qty 1

## 2016-12-17 MED ORDER — PNEUMOCOCCAL VAC POLYVALENT 25 MCG/0.5ML IJ INJ
0.5000 mL | INJECTION | INTRAMUSCULAR | Status: AC
  Administered 2016-12-18: 0.5 mL via INTRAMUSCULAR
  Filled 2016-12-17: qty 0.5

## 2016-12-17 MED ORDER — ACETAMINOPHEN 325 MG PO TABS: 650.0000 mg | ORAL_TABLET | Freq: Four times a day (QID) | ORAL | Status: DC | PRN

## 2016-12-17 MED ORDER — ALBUTEROL SULFATE (2.5 MG/3ML) 0.083% IN NEBU: 2.5000 mg | INHALATION_SOLUTION | Freq: Four times a day (QID) | RESPIRATORY_TRACT | Status: DC | PRN

## 2016-12-17 NOTE — H&P (Signed)
[facility] History and Physical  JI FELDNER WUJ:[id number] DOB: 10-Jul-1970 DOA: 12/17/2016  Referring physician: DR Clarene Duke PCP: Patient, No Pcp Per   Chief Complaint: Chest pain  HPI: Bradley Hays is a 46 y.o. male w/ history of tobacco use, past hx MI w/ cor stent, HTN, HL, COPD , cocaine use, asthma presented to ED today with chest pain x 3 hours and syncopal episode x 1.  Was here 1 month ago w/ similar issues and refused admission.  In ED CXR, EKG and troponin are negative.  Has rec'd po ASA, albuterol/ duoneb nebs and IV steroids, as well as 4mg  IV ms04 and SL NTG x 2.  Patient has multiple c/o's, chest pain, passing and DOE.  DOE - can't walk "to the mailbox" , get's very weak and SOB.  Chest pain - comes and goes and occ associated with chronic cough. No crushign CP, no assoc n/v or diaphoresis. Not assoc w/ activity but IS associated w/ emotional state ("angry" or "agitated").  Syncope - happens frequently ,going on for 2-3 years now, has happened while driving so no longer is he driving; has happened from coughing, from standing up and while seated in a chair w/o coughing.  Today's episode was typical, wakes up w/o CP or tongue biting or incontinence.  He does endorse palpitations when asked, but not being treated for any arrhythmia that he is aware of.   Last cardiology visit in Aug 2017, they did heart cath put pt thinks they didn't "do anything", this was in [place] while he was living in [place] area.  Moves to [place] 4-5 mos ago and is out of all his medications except Seroquel and Albuterol.  Has medicaid and gets disability.  Says he needs a heart doctor and a family doctor here in [place].   Denies any issues with chronic pain and is not taking any narcotics at this time at  Home.     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 Has hx of chronic pain as well.      Old chart Oct 15 - fall off roof w/ bilat wrist fx's > underwent closed reconstruction bilat wrist, also HTN/ HL/ tobacco use.   ROS  no joint pain   no HA  no blurry vision  no rash  no diarrhea  no nausea/ vomiting  no dysuria  no difficulty voiding  no change in urine color    Past Medical History  Past Medical History:  Diagnosis Date  . 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 Asthma   . Cocaine abuse    remote, quit 2010  . COPD (chronic obstructive pulmonary disease) (HCC)   . Gastroesophageal reflux disease   . Hyperlipidemia    lipid profile in 2011:185, 169, 37, 114; 2012: Total cholesterol of 311 and triglycerides of 1730  . Hypertension   . 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 Tobacco abuse    40 pack years; 0.5 PPD  . 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 APPENDECTOMY  2009  . CORONARY STENT PLACEMENT  2011   bare metal  . EYE SURGERY    . KNEE ARTHROSCOPY    . NISSEN FUNDOPLICATION  1992  . ORIF WRIST FRACTURE Bilateral 04/02/2014   Procedure: OPEN REDUCTION INTERNAL FIXATION (ORIF) WRIST FRACTURE AND CARPAL TUNNEL RELEASES;  Surgeon: Dominica Severin, MD;  Location: [facility] OR;  Service: Orthopedics;  Laterality: Bilateral;  . SHOULDER ARTHROSCOPY WITH DEBRIDEMENT AND BICEP TENDON REPAIR Left 10/10/2016   Procedure: LEFT SHOULDER ARTHROSCOPY WITH DEBRIDEMENT AND BICEPS TENODESIS;  Surgeon: Cammy Copa, MD;  Location: [facility] OR;  Service: Orthopedics;  Laterality: Left;    Family History  Family History  Problem Relation Age of Onset  . Heart disease Unknown   . Arthritis Unknown   . Lung disease Unknown   . Cancer Unknown   . Asthma Unknown   . Kidney disease Unknown   . Hypertension Mother   . Cancer Father   . Heart disease Father   . Hypertension Father    Social History  reports that he has been smoking Cigarettes.  He has a 13.00 pack-year smoking history. He has never used smokeless tobacco. He reports that he drinks alcohol. He reports that he does not use drugs. Allergies  Allergies  Allergen Reactions  . No Known Allergies    Home medications Prior to Admission medications   Medication Sig Start Date End Date Taking? Authorizing Provider  albuterol (PROVENTIL HFA;VENTOLIN HFA) 108 (90 Base) MCG/ACT inhaler Inhale 2 puffs into the lungs every 6 (six) hours as needed for wheezing or shortness of breath.  09/19/16 09/19/17 Yes [provider]  aspirin EC 81 MG tablet Take 81 mg by mouth daily. 01/16/16  Yes [provider]  Aspirin-Acetaminophen-Caffeine (GOODY HEADACHE PO) Take 1 packet by mouth daily as needed (headache).   Yes [provider]  nitroGLYCERIN (NITROSTAT) 0.4 MG SL tablet Place 1 tablet (0.4 mg total) under the tongue as needed. 09/29/16  Yes Strader, [place] M, PA-C  QUEtiapine (SEROQUEL) 50 MG tablet Take 50 mg by mouth at bedtime. 02/21/16  Yes [provider]  atorvastatin (LIPITOR) 40 MG tablet Take 1 tablet (40 mg total) by mouth daily. Patient not taking: Reported on 12/17/2016 09/29/16 12/28/16  [place] Ouch Lennart Pall, PA-C  clopidogrel (PLAVIX) 75 MG tablet Take 1 tablet (75 mg total) by mouth daily. Patient not taking: Reported on 11/23/2016 09/29/16 09/29/17  [place] Ouch, Lennart Pall, PA-C  lisinopril (PRINIVIL,ZESTRIL) 10 MG tablet Take 1 tablet (10 mg total) by mouth daily. Patient not taking: Reported on 12/17/2016 09/29/16   Ellsworth Lennox, PA-C  methocarbamol (ROBAXIN) 500 MG tablet 1 po q  8hrs prn pain Patient not taking: Reported on 11/23/2016 10/03/16   Cammy Copa, MD  methocarbamol (ROBAXIN) 500 MG tablet Take 1 tablet (500 mg total) by mouth every 8 (eight) hours as needed for muscle spasms. Patient not taking: Reported on 11/23/2016 10/19/16   Cammy Copa, MD  metoprolol tartrate (LOPRESSOR) 25 MG tablet Take 1 tablet (25 mg total) by mouth 2 (two) times daily. Patient not taking: Reported on 12/17/2016 09/29/16   Ellsworth Lennox, PA-C  oxyCODONE (OXY IR/ROXICODONE) 5 MG immediate release tablet Take 1 tablet (5 mg total) by mouth every 6 (six) hours as needed for severe pain. Patient not taking: Reported on 11/23/2016 10/19/16  Cammy Copaean, Scott Gregory, MD  oxycodone (OXY-IR) 5 MG capsule 1-2 po q 6hrs prn pain Patient not taking: Reported on 11/23/2016 10/03/16   Cammy Copaean, Scott Gregory, MD   Liver Function Tests No results for input(s): AST, ALT, ALKPHOS, BILITOT, PROT, ALBUMIN in the last 168 hours. No results for input(s): LIPASE, AMYLASE in the last 168 hours. CBC  Recent Labs Lab 12/17/16 1708  WBC 10.1  NEUTROABS 6.4  HGB 15.4  HCT 45.1  MCV 88.8  PLT 221   Basic Metabolic Panel  Recent Labs Lab 12/17/16 1708  NA 143  K 3.7  CL 108  CO2 25  GLUCOSE 143*  BUN 15  CREATININE 0.99  CALCIUM 9.3     Vitals:   12/17/16 1816 12/17/16 1817 12/17/16 1830 12/17/16 1914  BP: 133/86 (!) 151/88 132/79   Pulse: 88 97 86   Resp: (!) 21 16 12    Temp:      TempSrc:      SpO2: 92% 91% 95% 96%  Weight:      Height:       Exam: Gen alert, no distress, tatoos No rash, cyanosis or gangrene Sclera anicteric, throat clear and moist  No jvd or bruits Chest clear bilat RRR no MRG Abd soft ntnd no mass or ascites +bs GU normal male MS no joint effusions or deformity Ext no LE or UE edema / no wounds or ulcers Neuro is alert, Ox 3 , nf   Na 143  K 3.7  BUN 15  Cr 0.99  Ca 9.3  WBC 10k  Hb 15  plt 221 Trop 0.00  Home meds > albuterol mdi, asa,  goody powder prn, seroquel, lipitor, plavix, prinivil, robaxin prn, lopressor, Oxy IR 5 qid prn  EKG (independ reviewed) > NSR w/o acute changes CXR (independ reviewed) > no acute disease    Assess: 1  Chest pain / recurrent syncope/ PVC's w/ trigeminy - hx of LCx stent, not taking Plavix or BP meds; moved here 5 mos ago and needs new doctors. Plan is to admit, serial trop r/o MI, cardiology consult in am.  May need EP evaluation.  2  Syncope - recurrent x 2-3 yrs. Related to coughing/ standing/ palpitations, but also occurs w/o other symptoms. Not sure etiology, concerning are the mult PVC's and trigemny on EKG.  Check orthostatics. Get carotid dopplers and head CT, ECHO in am.  3  Hx CAD/ MI / ischemic and HTN'sive CM - last ECHO Ef was 45-50%.  No signs of decomp CHF today.  4  HTN - not taking any BP meds for sometime. BP ok here. Will order metoprolol and hold lisinopril for now. 5  COPD - still smoking, wheezing a bit and coughing. Plan duoneb + alb neb prn, po pred 40 mg/ d.  6  Hx drug use (cocaine)  Plan - as above      Liberty Seto D [facility] Pager [phone]   If 7PM-7AM, please contact night-coverage [email or web address] Password [email or web address] 12/17/2016, 7:31 PM

## 2016-12-17 NOTE — Progress Notes (Signed)
MEDICATION RELATED CONSULT NOTE   Pharmacy Consult for lovenox Indication: dvt pxl  Allergies  Allergen Reactions  . No Known Allergies     Patient Measurements: Height: 5\' 11"  (180.3 cm) Weight: 230 lb (104.3 kg) IBW/kg (Calculated) : 75.3   Vital Signs: Temp: 98.3 F (36.8 C) (06/24 2141) Temp Source: Axillary (06/24 2141) BP: 156/73 (06/24 2141) Pulse Rate: 89 (06/24 2141) Intake/Output from previous day: No intake/output data recorded. Intake/Output from this shift: No intake/output data recorded.  Labs:  Recent Labs  12/17/16 1708  WBC 10.1  HGB 15.4  HCT 45.1  PLT 221  CREATININE 0.99   Estimated Creatinine Clearance: 113.4 mL/min (by C-G formula based on SCr of 0.99 mg/dL).   Microbiology: No results found for this or any previous visit (from the past 720 hour(s)).  Medical History: Past Medical History:  Diagnosis Date  . 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 Asthma   . Cocaine abuse    remote, quit 2010  . COPD (chronic obstructive pulmonary disease) (HCC)   . Gastroesophageal reflux disease   . Hyperlipidemia    lipid profile in 2011:185, 169, 37, 114; 2012: Total cholesterol of 311 and triglycerides of 1730  . Hypertension   . 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 Tobacco abuse    40 pack years; 0.5 PPD  . 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 albuterol (PROVENTIL HFA;VENTOLIN HFA) 108 (90 Base) MCG/ACT inhaler Inhale 2 puffs into the lungs every 6 (six) hours as needed for wheezing or shortness of breath.    Past Week at  Unknown time  . aspirin EC 81 MG tablet Take 81 mg by mouth daily.   12/17/2016 at Unknown time  . Aspirin-Acetaminophen-Caffeine (GOODY HEADACHE PO) Take 1 packet by mouth daily as needed (headache).   12/17/2016 at Unknown time  . nitroGLYCERIN (NITROSTAT) 0.4 MG SL tablet Place 1 tablet (0.4 mg total) under the tongue as needed. 25 tablet 3 unknown  . QUEtiapine (SEROQUEL) 50 MG tablet Take 50 mg by mouth at bedtime.   12/16/2016 at Unknown time  . atorvastatin (LIPITOR) 40 MG tablet Take 1 tablet (40 mg total) by mouth daily. (Patient not taking: Reported on 12/17/2016) 90 tablet 3 Not Taking at Unknown time  . clopidogrel (PLAVIX) 75 MG tablet Take 1 tablet (75 mg total) by mouth daily. (Patient not taking: Reported on 11/23/2016) 90 tablet 3 Not Taking at Unknown time  . lisinopril (PRINIVIL,ZESTRIL) 10 MG tablet Take 1 tablet (10 mg total) by mouth daily. (Patient not taking: Reported on 12/17/2016) 90 tablet 3 Not Taking at Unknown time  . methocarbamol (ROBAXIN) 500 MG tablet 1 po q 8hrs prn pain (Patient not taking: Reported on 11/23/2016) 30 tablet 0 Completed Course at Unknown time  . methocarbamol (ROBAXIN) 500 MG tablet Take 1 tablet (500 mg total) by mouth every 8 (eight) hours as needed for muscle spasms. (Patient not taking: Reported on 11/23/2016) 30 tablet 0 Completed Course at Unknown time  . metoprolol tartrate (LOPRESSOR) 25 MG  tablet Take 1 tablet (25 mg total) by mouth 2 (two) times daily. (Patient not taking: Reported on 12/17/2016) 180 tablet 3 Not Taking at Unknown time  . oxyCODONE (OXY IR/ROXICODONE) 5 MG immediate release tablet Take 1 tablet (5 mg total) by mouth every 6 (six) hours as needed for severe pain. (Patient not taking: Reported on 11/23/2016) 60 tablet 0 Completed Course at Unknown time  . oxycodone (OXY-IR) 5 MG capsule 1-2 po q 6hrs prn pain (Patient not taking: Reported on 11/23/2016) 60 capsule 0 Completed Course at Unknown time    Assessment: 46 yo M admitted for  CP.  Plan to r/o MI.  Lovenox ordered for DVT pxl.  Based on ht/wt, pt BMI >30.  Goal of Therapy:  dvt pxl  Plan:  Adjust lovenox to 50 mg sq q24h for dvt pxl in obestiy  Drusilla KannerGrimsley, Ismar Yabut Lydia 12/17/2016,9:42 PM

## 2016-12-17 NOTE — ED Notes (Signed)
Pt c/o headache.  Dr.Mcmanus notified and orders received

## 2016-12-17 NOTE — ED Triage Notes (Signed)
Chest pain times 3 hours.  States he passed out .  Here with same one month ago and refused admission

## 2016-12-17 NOTE — ED Notes (Signed)
Morphine given.  Pt states his headache is better.  Continue to c/o chest pain that now radiates to left side of chest.  Unable to rate .  States "it just hurts"

## 2016-12-17 NOTE — ED Provider Notes (Signed)
[facility] DEPT Provider Note   CSN: [id number] Arrival date & time: 12/17/16  1646     History   Chief Complaint Chief Complaint  Patient presents with  . Chest Pain    HPI Bradley Hays is a 46 y.o. male.  HPI  Pt was seen at 1700. Per pt, c/o sudden onset and resolution of one episode of near syncope that occurred approximately 3 to 4 hours PTA. Pt states he was sitting in a chair, coughed, then stood up and had a near syncopal episode. Endorses coughing and wheezing for the past 2 to 3 days; states he has a MDI prescription but "didn't fill it." Pt states since the near syncopal episode PTA, he has developed mid-sternal chest "pain." Describes the CP as "tightness" and "worsening." Endorses hx of CAD with stent placement. Pt has not been taking any of his meds "for a while now." Pt was evaluated in the ED approximately 1 month ago for similar symptoms and refused admission. Pt also has not f/u with Cards MD (no show to appt). Denies palpitations, no fevers, no back pain, no abd pain, no N/V/D, no headache, no visual changes, no focal motor weakness, no tingling/numbness in extremities.    Past Medical History:  Diagnosis Date  . 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 Asthma   . Cocaine abuse    remote, quit 2010  . COPD (chronic obstructive pulmonary disease) (HCC)   . Gastroesophageal reflux disease   . Hyperlipidemia    lipid profile in 2011:185, 169, 37, 114; 2012: Total cholesterol of 311 and triglycerides of 1730  . Hypertension   . 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 Tobacco abuse    40 pack years; 0.5 PPD  . 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 Arthritis 03/29/2016  . Pain medication agreement 03/29/2016  . History of wrist fracture 03/01/2016  . Hx of long term use of blood thinners 03/01/2016  . Barrett's esophagus with dysplasia 02/01/2016  . Chronic pain of multiple joints 02/01/2016  . Coronary artery disease involving native coronary artery of native heart with angina pectoris (HCC) 02/01/2016  . Generalized anxiety disorder 02/01/2016  . Nocturia more than twice per night 02/01/2016  . Psychophysiological insomnia 02/01/2016  . Abnormal EKG 01/31/2016  . Dyspnea on exertion 01/31/2016  . Easy fatigability 01/31/2016  . Family history of premature CAD 01/31/2016  . S/P coronary artery stent placement 01/31/2016  . Accelerated hypertension with heart disease and without congestive heart failure 01/15/2016  . COPD (chronic obstructive pulmonary disease) (HCC) 01/15/2016  . Headache 01/15/2016  . Preoperative cardiovascular examination 04/02/2014  . Closed fracture of distal ends of radius and ulna, bilateral 04/02/2014  . Distal radius fracture 04/02/2014  . Fatty liver 10/27/2011  . RUQ pain 10/27/2011  . Nausea 10/27/2011  . Hypertension   . Hyperlipidemia LDL goal <70   . Tobacco abuse   . Gastroesophageal reflux disease   . Cocaine abuse   . Arteriosclerotic cardiovascular disease (ASCVD) 11/10/2009    Past Surgical History:  Procedure Laterality Date  . APPENDECTOMY  2009  . CORONARY STENT PLACEMENT  2011   bare metal  . EYE SURGERY    . KNEE ARTHROSCOPY    . NISSEN FUNDOPLICATION  1992  . ORIF WRIST FRACTURE Bilateral 04/02/2014   Procedure: OPEN REDUCTION INTERNAL FIXATION (ORIF) WRIST FRACTURE AND CARPAL TUNNEL RELEASES;  Surgeon: Dominica Severin, MD;  Location: [facility] OR;  Service: Orthopedics;  Laterality: Bilateral;  . SHOULDER ARTHROSCOPY WITH DEBRIDEMENT AND BICEP TENDON REPAIR Left 10/10/2016   Procedure: LEFT SHOULDER  ARTHROSCOPY WITH DEBRIDEMENT AND BICEPS TENODESIS;  Surgeon: Cammy Copa, MD;  Location: [facility] OR;  Service: Orthopedics;  Laterality: Left;       Home Medications    Prior to Admission medications   Medication Sig Start Date End Date Taking? Authorizing Provider  albuterol (PROVENTIL HFA;VENTOLIN HFA) 108 (90 Base) MCG/ACT inhaler Inhale 2 puffs into the lungs every 6 (six) hours as needed for wheezing or shortness of breath.  09/19/16 09/19/17  [provider]  aspirin EC 81 MG tablet Take 81 mg by mouth daily. 01/16/16   [provider]  Aspirin-Acetaminophen-Caffeine (GOODY HEADACHE PO) Take 1 packet by mouth daily as needed (headache).    [provider]  atorvastatin (LIPITOR) 40 MG tablet Take 1 tablet (40 mg total) by mouth daily. Patient taking differently: Take 40 mg by mouth every evening.  09/29/16 12/28/16  Ellsworth Lennox, PA-C  clopidogrel (PLAVIX) 75 MG tablet Take 1 tablet (75 mg total) by mouth daily. Patient not taking: Reported on 11/23/2016 09/29/16 09/29/17  Iran Ouch, Lennart Pall, PA-C  lisinopril (PRINIVIL,ZESTRIL) 10 MG tablet Take 1 tablet (10 mg total) by mouth daily. 09/29/16   Strader, Lennart Pall, PA-C  methocarbamol (ROBAXIN) 500 MG tablet 1 po q 8hrs prn pain Patient not taking: Reported on 11/23/2016 10/03/16   Cammy Copa, MD  methocarbamol (ROBAXIN) 500 MG tablet Take 1 tablet (500 mg total) by mouth every 8 (eight) hours as needed for muscle spasms. Patient not taking: Reported on 11/23/2016 10/19/16   Cammy Copa, MD  metoprolol tartrate (LOPRESSOR) 25 MG tablet Take 1 tablet (25 mg total) by mouth 2 (two) times daily. 09/29/16   Strader, Lennart Pall, PA-C  nitroGLYCERIN (NITROSTAT) 0.4 MG SL tablet Place 1 tablet (0.4 mg total) under the tongue as needed. Patient taking differently: Place 0.4 mg under the tongue every 5 (five) minutes as needed for chest pain.  09/29/16   Strader, Lennart Pall, PA-C  oxyCODONE (OXY IR/ROXICODONE) 5  MG immediate release tablet Take 1 tablet (5 mg total) by mouth every 6 (six) hours as needed for severe pain. Patient not taking: Reported on 11/23/2016 10/19/16   Cammy Copa, MD  oxycodone (OXY-IR) 5 MG capsule 1-2 po q 6hrs prn pain Patient not taking: Reported on 11/23/2016 10/03/16   Cammy Copa, MD  QUEtiapine (SEROQUEL) 50 MG tablet Take 50 mg by mouth at bedtime. 02/21/16   [provider]    Family History Family History  Problem Relation Age of Onset  . Heart disease Unknown   . Arthritis Unknown   . Lung disease Unknown   . Cancer Unknown   . Asthma Unknown   . Kidney disease Unknown   . Hypertension Mother   . Cancer Father   . Heart disease Father   . Hypertension Father     Social History Social History  Substance Use Topics  . Smoking status: Current Every Day Smoker    Packs/day:  0.50    Years: 26.00    Types: Cigarettes  . Smokeless tobacco: Never Used  . Alcohol use Yes     Comment: occasionally; reports not very often      Allergies   No known allergies   Review of Systems Review of Systems ROS: Statement: All systems negative except as marked or noted in the HPI; Constitutional: Negative for fever and chills. ; ; Eyes: Negative for eye pain, redness and discharge. ; ; ENMT: Negative for ear pain, hoarseness, nasal congestion, sinus pressure and sore throat. ; ; Cardiovascular: +chest pain. Negative for palpitations, diaphoresis, dyspnea and peripheral edema. ; ; Respiratory: +cough, wheezing. Negative for stridor. ; ; Gastrointestinal: Negative for nausea, vomiting, diarrhea, abdominal pain, blood in stool, hematemesis, jaundice and rectal bleeding. . ; ; Genitourinary: Negative for dysuria, flank pain and hematuria. ; ; Musculoskeletal: Negative for back pain and neck pain. Negative for swelling and trauma.; ; Skin: Negative for pruritus, rash, abrasions, blisters, bruising and skin lesion.; ; Neuro: Negative for headache and neck  stiffness. Negative for weakness, altered level of consciousness, altered mental status, extremity weakness, paresthesias, involuntary movement, seizure and +lightheadedness, near syncope.     Physical Exam Updated Vital Signs BP (!) 142/105   Pulse 93   Temp 98.2 F (36.8 C) (Oral)   Resp 19   Ht 5\' 11"  (1.803 m)   Wt 104.3 kg (230 lb)   SpO2 96%   BMI 32.08 kg/m   Physical Exam 1705: Physical examination:  Nursing notes reviewed; Vital signs and O2 SAT reviewed;  Constitutional: Well developed, Well nourished, Well hydrated, In no acute distress; Head:  Normocephalic, atraumatic; Eyes: EOMI, PERRL, No scleral icterus; ENMT: Mouth and pharynx normal, Mucous membranes moist; Neck: Supple, Full range of motion, No lymphadenopathy; Cardiovascular: Regular rate and rhythm, No gallop; Respiratory: Breath sounds diminished & equal bilaterally, faint scattered wheezes. No audible wheezing. +frequent non-productive cough during exam. Speaking full sentences with ease, Normal respiratory effort/excursion; Chest: Nontender, Movement normal; Abdomen: Soft, Nontender, Nondistended, Normal bowel sounds; Genitourinary: No CVA tenderness; Extremities: Pulses normal, No tenderness, No edema, No calf edema or asymmetry.; Neuro: AA&Ox3, Major CN grossly intact.  Speech clear. No gross focal motor or sensory deficits in extremities.; Skin: Color normal, Warm, Dry.   ED Treatments / Results  Labs (all labs ordered are listed, but only abnormal results are displayed)   EKG  EKG Interpretation  Date/Time:  Sunday December 17 2016 16:57:02 EDT Ventricular Rate:  89 PR Interval:    QRS Duration: 103 QT Interval:  345 QTC Calculation: 420 R Axis:   66 Text Interpretation:  Sinus rhythm When compared with ECG of 11/23/2016 No significant change was found Confirmed by [facility]  MD, Nicholos JohnsKATHLEEN [phone]([id number]) on 12/17/2016 5:05:10 PM       Radiology   Procedures Procedures (including critical care  time)  Medications Ordered in ED Medications  aspirin chewable tablet 324 mg (not administered)  nitroGLYCERIN (NITROSTAT) SL tablet 0.4 mg (not administered)  methylPREDNISolone sodium succinate (SOLU-MEDROL) 125 mg/2 mL injection 125 mg (not administered)  ipratropium-albuterol (DUONEB) 0.5-2.5 (3) MG/3ML nebulizer solution 3 mL (not administered)  albuterol (PROVENTIL) (2.5 MG/3ML) 0.083% nebulizer solution 2.5 mg (not administered)     Initial Impression / Assessment and Plan / ED Course  I have reviewed the triage vital signs and the nursing notes.  Pertinent labs & imaging results that were available during my care of the patient were reviewed by me and considered in my medical  decision making (see chart for details).  MDM Reviewed: previous chart, nursing note and vitals Reviewed previous: labs and ECG Interpretation: labs, ECG and x-ray    Results for orders placed or performed during the hospital encounter of 12/17/16  Basic metabolic panel  Result Value Ref Range   Sodium 143 135 - 145 mmol/L   Potassium 3.7 3.5 - 5.1 mmol/L   Chloride 108 101 - 111 mmol/L   CO2 25 22 - 32 mmol/L   Glucose, Bld 143 (H) 65 - 99 mg/dL   BUN 15 6 - 20 mg/dL   Creatinine, Ser 1.61 0.61 - 1.24 mg/dL   Calcium 9.3 8.9 - 09.6 mg/dL   GFR calc non Af Amer >60 >60 mL/min   GFR calc Af Amer >60 >60 mL/min   Anion gap 10 5 - 15  CBC  Result Value Ref Range   WBC 10.1 4.0 - 10.5 K/uL   RBC 5.08 4.22 - 5.81 MIL/uL   Hemoglobin 15.4 13.0 - 17.0 g/dL   HCT 04.5 40.9 - 81.1 %   MCV 88.8 78.0 - 100.0 fL   MCH 30.3 26.0 - 34.0 pg   MCHC 34.1 30.0 - 36.0 g/dL   RDW 91.4 78.2 - 95.6 %   Platelets 221 150 - 400 K/uL  Differential  Result Value Ref Range   Neutrophils Relative % 63 %   Neutro Abs 6.4 1.7 - 7.7 K/uL   Lymphocytes Relative 28 %   Lymphs Abs 2.8 0.7 - 4.0 K/uL   Monocytes Relative 7 %   Monocytes Absolute 0.7 0.1 - 1.0 K/uL   Eosinophils Relative 2 %   Eosinophils Absolute  0.2 0.0 - 0.7 K/uL   Basophils Relative 0 %   Basophils Absolute 0.0 0.0 - 0.1 K/uL  I-stat troponin, ED  Result Value Ref Range   Troponin i, poc 0.00 0.00 - 0.08 ng/mL   Comment 3          I-stat troponin, ED  Result Value Ref Range   Troponin i, poc 0.00 0.00 - 0.08 ng/mL   Comment 3           Dg Chest 2 View Result Date: 12/17/2016 CLINICAL DATA:  Chest pain for 3 hours EXAM: CHEST  2 VIEW COMPARISON:  11/23/2016 FINDINGS: The heart size and mediastinal contours are within normal limits. Both lungs are clear. The visualized skeletal structures are unremarkable. IMPRESSION: No active cardiopulmonary disease. Electronically Signed   By: Alcide Clever M.D.   On: 12/17/2016 17:38    1840:  Pt given ASA, SL ntg and morphine. Also given IV solumedrol and short neb for frequent coughing and diminished lung sounds. Lungs now coarse, pt continues to have frequent cough; will dose another neb. Will also check 2nd troponin. Pt states he is starting to feel better. Given hx, pt strongly encouraged to stay for admission. Pt agreeable. T/C to [facility] Dr. Arlean Hopping, case discussed, including:  HPI, pertinent PM/SHx, VS/PE, dx testing, ED course and treatment:  Agreeable to admit.   2000:  2nd troponin remains negative.   Final Clinical Impressions(s) / ED Diagnoses   Final diagnoses:  None    New Prescriptions New Prescriptions   No medications on file     Samuel Jester, DO 12/20/16 1601

## 2016-12-18 ENCOUNTER — Inpatient Hospital Stay (HOSPITAL_COMMUNITY): Payer: Medicaid Other

## 2016-12-18 DIAGNOSIS — R55 Syncope and collapse: Secondary | ICD-10-CM

## 2016-12-18 DIAGNOSIS — I10 Essential (primary) hypertension: Secondary | ICD-10-CM

## 2016-12-18 DIAGNOSIS — J45901 Unspecified asthma with (acute) exacerbation: Secondary | ICD-10-CM

## 2016-12-18 DIAGNOSIS — F1410 Cocaine abuse, uncomplicated: Secondary | ICD-10-CM

## 2016-12-18 DIAGNOSIS — Z720 Tobacco use: Secondary | ICD-10-CM

## 2016-12-18 DIAGNOSIS — R0609 Other forms of dyspnea: Secondary | ICD-10-CM

## 2016-12-18 LAB — COMPREHENSIVE METABOLIC PANEL
ALBUMIN: 3.9 g/dL (ref 3.5–5.0)
ALK PHOS: 59 U/L (ref 38–126)
ALT: 31 U/L (ref 17–63)
AST: 27 U/L (ref 15–41)
Anion gap: 11 (ref 5–15)
BUN: 17 mg/dL (ref 6–20)
CALCIUM: 9.3 mg/dL (ref 8.9–10.3)
CO2: 25 mmol/L (ref 22–32)
Chloride: 106 mmol/L (ref 101–111)
Creatinine, Ser: 1.33 mg/dL — ABNORMAL HIGH (ref 0.61–1.24)
GFR calc Af Amer: >60 mL/min (ref >60)
GLUCOSE: 219 mg/dL — AB (ref 65–99)
POTASSIUM: 4 mmol/L (ref 3.5–5.1)
Sodium: 142 mmol/L (ref 135–145)
Total Bilirubin: 0.3 mg/dL (ref 0.3–1.2)
Total Protein: 7.1 g/dL (ref 6.5–8.1)

## 2016-12-18 LAB — TROPONIN I: Troponin I: <0.03 ng/mL (ref <0.03)

## 2016-12-18 MED ORDER — IPRATROPIUM-ALBUTEROL 0.5-2.5 (3) MG/3ML IN SOLN: 3.0000 mL | RESPIRATORY_TRACT | Status: DC | PRN

## 2016-12-18 MED ORDER — ALBUTEROL SULFATE HFA 108 (90 BASE) MCG/ACT IN AERS: 2.0000 | INHALATION_SPRAY | RESPIRATORY_TRACT | 2 refills | Status: AC | PRN

## 2016-12-18 MED ORDER — ASPIRIN EC 81 MG PO TBEC: 81.0000 mg | DELAYED_RELEASE_TABLET | Freq: Every day | ORAL | 0 refills | Status: AC

## 2016-12-18 MED ORDER — CLOPIDOGREL BISULFATE 75 MG PO TABS: 75.0000 mg | ORAL_TABLET | Freq: Every day | ORAL | 0 refills | Status: AC

## 2016-12-18 MED ORDER — HYDROCODONE-ACETAMINOPHEN 5-325 MG PO TABS: 1.0000 | ORAL_TABLET | Freq: Three times a day (TID) | ORAL | 0 refills | Status: DC | PRN

## 2016-12-18 MED ORDER — ATORVASTATIN CALCIUM 40 MG PO TABS: 40.0000 mg | ORAL_TABLET | Freq: Every day | ORAL | 0 refills | Status: AC

## 2016-12-18 MED ORDER — METOPROLOL TARTRATE 25 MG PO TABS: 25.0000 mg | ORAL_TABLET | Freq: Two times a day (BID) | ORAL | 0 refills | Status: AC

## 2016-12-18 MED ORDER — LISINOPRIL 10 MG PO TABS: 10.0000 mg | ORAL_TABLET | Freq: Every day | ORAL | 0 refills | Status: AC

## 2016-12-18 MED ORDER — NICOTINE 21 MG/24HR TD PT24
21.0000 mg | MEDICATED_PATCH | Freq: Every day | TRANSDERMAL | Status: DC
  Administered 2016-12-18: 21 mg via TRANSDERMAL
  Filled 2016-12-18: qty 1

## 2016-12-18 MED ORDER — NITROGLYCERIN 0.4 MG SL SUBL: 0.4000 mg | SUBLINGUAL_TABLET | SUBLINGUAL | 0 refills | Status: AC | PRN

## 2016-12-18 MED ORDER — IPRATROPIUM-ALBUTEROL 0.5-2.5 (3) MG/3ML IN SOLN: 3.0000 mL | RESPIRATORY_TRACT | 0 refills | Status: AC | PRN

## 2016-12-18 MED ORDER — PREDNISONE 20 MG PO TABS: 40.0000 mg | ORAL_TABLET | Freq: Every day | ORAL | 0 refills | Status: AC

## 2016-12-18 NOTE — Discharge Summary (Signed)
Physician Discharge Summary  Bradley Hays MVH:[id number] DOB: 08/03/1970 DOA: 12/17/2016  PCP: Patient, No Pcp Per  Admit date: 12/17/2016 Discharge date: 12/18/2016  Admitted From: Home  Disposition:  Home    Recommendations for Outpatient Follow-up:  1. Follow up with PCP in 1- week 2. Patient has been placed on bronchodilator therapy 3. Short course of steroids, due to suspected cough related asthma 4. All medications were refilled 5. Instructed to return to the ED if recurrent chest pain.   Home Health: No  Equipment/Devices: Nebulizer   Discharge Condition: Stable  CODE STATUS: Full  Diet recommendation: Heart healthy  Brief/Interim Summary: This is a 46 year old male who presented to hospital with the chest pain after sustaining a syncope episode. Patient is known to have coronary artery disease, status post angioplasty, hypertension, COPD, cocaine use, asthma. Lately he is a cough has been more persistent, the day before admission, patient developed lightheadedness and dizziness, associated with a cough then developed a syncope episode. After the fall, he developed chest pain, being constant, nonexertional related. He has been not taking his medications. On initial physical examination blood pressure 133/86, heart rate 88, respiratory 21, oxygen saturation 92%, his lungs were clear to auscultation bilaterally, heart S1-S2 present rhythmic, his abdomen soft nontender, no lower extremity edema. Sodium 143, potassium 3.7, chloride 108, bicarbonate 25, glucose 143, BUN 15, creatinine 0.9, white count 10.1, hemoglobin 15.4, hematocrit 45.1, platelets 221. Cardiac enzymes negative, head CT negative for acute findings, her chest x-ray was negative for infiltrates, EKG normal sinus rhythm.  Patient was admitted to hospital with working diagnosis of chest pain associated with syncope episode, cough and shortness of breath.  1. Syncope, related to persistent cough due to cough related  asthma flare. Patient reports persistent cough worse with environmental triggers, has tried over-the-counter medications without much improvement of his symptoms. In the past steroids and bronchodilators helped. Likely patient does have cough related asthma and is going through an exacerbation. Will prescribe bronchodilator therapy to use at home, with a DuoNeb, albuterol rescue inhaler, prednisone 40 mg daily for 5 days. Smoking cessation counseling, outpatient follow-up, patient may need a outpatient pulmonary function testing.   2. Coronary artery disease. Last cardiac catheterization August 2017, ischemic cardiomyopathy with severe hypokinesis of the basal to mid segment of the inferior wall, ejection fraction 40-45%. Significant multivessel atherosclerotic coronary disease, high-grade intra-stent restenosis within mid and distal segments of the left circumflex coronary artery. Patient was advised to continue aggressive medical management. Unfortunately patient has been noncompliant with his medications. Refills for his cardiac medications have been given, encourage about medical compliance. Smoking cessation. Patient has ruled out for acute coronary syndrome. Continue aspirin,  clopidogrel, statin, ACE inhibitor and beta blockade.  3. Hypertension. Blood pressure remained well-controlled, continue lisinopril and metoprolol.  4. Atypical chest pain. Probably musculoskeletal, continue analgesics.  Patient is requesting to be discharged, he had a family related emergency. He is threatening to leave AGAINST MEDICAL ADVICE if he is not released today. Acute coronary syndrome has been ruled out, patient will be discharge home, with prescriptions for all his medications, he has been encouraged about compliance with his medications. He has been advised to return to the hospital if recurrent chest pain.   His initial symptoms have been improved remarkably to the point where he'll be discharged before 2  midnights stay.   Discharge Diagnoses:  Principal Problem:   Syncope Active Problems:   Hypertension   Tobacco abuse   COPD (chronic obstructive  pulmonary disease) (HCC)   Dyspnea on exertion   S/P coronary artery stent placement   Chronic systolic CHF (congestive heart failure) (HCC)   Essential hypertension   Chest pain   History of cocaine abuse    Discharge Instructions   Allergies as of 12/18/2016      Reactions   No Known Allergies       Medication List    STOP taking these medications   GOODY HEADACHE PO   methocarbamol 500 MG tablet Commonly known as:  ROBAXIN   oxycodone 5 MG capsule Commonly known as:  OXY-IR   oxyCODONE 5 MG immediate release tablet Commonly known as:  Oxy IR/ROXICODONE     TAKE these medications   albuterol 108 (90 Base) MCG/ACT inhaler Commonly known as:  PROVENTIL HFA;VENTOLIN HFA Inhale 2 puffs into the lungs every 4 (four) hours as needed for wheezing or shortness of breath. What changed:  when to take this   aspirin EC 81 MG tablet Take 1 tablet (81 mg total) by mouth daily.   atorvastatin 40 MG tablet Commonly known as:  LIPITOR Take 1 tablet (40 mg total) by mouth daily.   clopidogrel 75 MG tablet Commonly known as:  PLAVIX Take 1 tablet (75 mg total) by mouth daily.   HYDROcodone-acetaminophen 5-325 MG tablet Commonly known as:  NORCO/VICODIN Take 1 tablet by mouth every 8 (eight) hours as needed for moderate pain.   ipratropium-albuterol 0.5-2.5 (3) MG/3ML Soln Commonly known as:  DUONEB Take 3 mLs by nebulization every 4 (four) hours as needed.   lisinopril 10 MG tablet Commonly known as:  PRINIVIL,ZESTRIL Take 1 tablet (10 mg total) by mouth daily.   metoprolol tartrate 25 MG tablet Commonly known as:  LOPRESSOR Take 1 tablet (25 mg total) by mouth 2 (two) times daily.   nitroGLYCERIN 0.4 MG SL tablet Commonly known as:  NITROSTAT Place 1 tablet (0.4 mg total) under the tongue as needed.   predniSONE  20 MG tablet Commonly known as:  DELTASONE Take 2 tablets (40 mg total) by mouth daily.   QUEtiapine 50 MG tablet Commonly known as:  SEROQUEL Take 50 mg by mouth at bedtime.            Durable Medical Equipment        Start     Ordered   12/18/16 0000  DME Nebulizer machine    Question:  Patient needs a nebulizer to treat with the following condition  Answer:  Asthma   12/18/16 1404      Allergies  Allergen Reactions  . No Known Allergies     Consultations:  NA   Procedures/Studies: Dg Chest 2 View  Result Date: 12/17/2016 CLINICAL DATA:  Chest pain for 3 hours EXAM: CHEST  2 VIEW COMPARISON:  11/23/2016 FINDINGS: The heart size and mediastinal contours are within normal limits. Both lungs are clear. The visualized skeletal structures are unremarkable. IMPRESSION: No active cardiopulmonary disease. Electronically Signed   By: Alcide Clever M.D.   On: 12/17/2016 17:38   Dg Chest 2 View  Result Date: 11/23/2016 CLINICAL DATA:  Central chest pain EXAM: CHEST  2 VIEW COMPARISON:  02/06/2015 FINDINGS: Normal heart size and mediastinal contours. No acute infiltrate or edema. No effusion or pneumothorax. No acute osseous findings. IMPRESSION: Negative chest. Electronically Signed   By: Marnee Spring M.D.   On: 11/23/2016 10:07   Ct Head Wo Contrast  Result Date: 12/18/2016 CLINICAL DATA:  Recurrent syncope. EXAM: CT HEAD WITHOUT CONTRAST  TECHNIQUE: Contiguous axial images were obtained from the base of the skull through the vertex without intravenous contrast. COMPARISON:  None. FINDINGS: Brain: Ventricles are normal in size and configuration. All areas of the brain demonstrate normal gray-white matter attenuation. There is no mass, hemorrhage, edema or other evidence of acute parenchymal abnormality. No extra-axial hemorrhage. Vascular: No hyperdense vessel or unexpected calcification. Skull: Normal. Negative for fracture or focal lesion. Sinuses/Orbits: Metallic foreign body  again identified posterior to the right orbital globe, stable in position. No acute findings. Other: None. IMPRESSION: 1. No acute intracranial abnormality. No intracranial mass, hemorrhage or edema. 2. Stable metallic density foreign body posterior to the right orbital globe. Electronically Signed   By: Bary Richard M.D.   On: 12/18/2016 10:15   Ct Head Wo Contrast  Result Date: 11/23/2016 CLINICAL DATA:  Dizziness for 1 day EXAM: CT HEAD WITHOUT CONTRAST TECHNIQUE: Contiguous axial images were obtained from the base of the skull through the vertex without intravenous contrast. COMPARISON:  10/18/2013 FINDINGS: Brain: No evidence of acute infarction, hemorrhage, hydrocephalus, extra-axial collection or mass lesion/mass effect. Vascular: No hyperdense vessel or unexpected calcification. Skull: Normal. Negative for fracture or focal lesion. Sinuses/Orbits: Radiopaque foreign body is again noted in the posterior aspect of the right orbit stable from the prior exam. Other: None. IMPRESSION: No acute intracranial abnormality is noted. Stable foreign body in the posterior aspect of the right orbit. Electronically Signed   By: Alcide Clever M.D.   On: 11/23/2016 10:31   US Carotid Bilateral  Result Date: 12/18/2016 CLINICAL DATA:  46 year old male the upper with recurrent syncope EXAM: BILATERAL CAROTID DUPLEX ULTRASOUND TECHNIQUE: Wallace Cullens scale imaging, color Doppler and duplex ultrasound were performed of bilateral carotid and vertebral arteries in the neck. COMPARISON:  Head CT 11/23/2016 FINDINGS: Criteria: Quantification of carotid stenosis is based on velocity parameters that correlate the residual internal carotid diameter with NASCET-based stenosis levels, using the diameter of the distal internal carotid lumen as the denominator for stenosis measurement. The following velocity measurements were obtained: RIGHT ICA:  80/31 cm/sec CCA:  107/21 cm/sec SYSTOLIC ICA/CCA RATIO:  0.8 DIASTOLIC ICA/CCA RATIO:  1.5  ECA:  134 cm/sec LEFT ICA:  102/34 cm/sec CCA:  119/21 cm/sec SYSTOLIC ICA/CCA RATIO:  0.9 DIASTOLIC ICA/CCA RATIO:  1.6 ECA:  165 cm/sec RIGHT CAROTID ARTERY: No significant atherosclerotic plaque, stenosis or other focal abnormality. RIGHT VERTEBRAL ARTERY:  Patent with normal antegrade flow. LEFT CAROTID ARTERY: Mild smooth heterogeneous atherosclerotic plaque in the carotid bulb and proximal internal carotid artery. By peak systolic velocity criteria the estimated stenosis remains less than 50%. LEFT VERTEBRAL ARTERY:  Patent with normal antegrade flow. IMPRESSION: 1. Mild (1-49%) stenosis proximal left internal carotid artery secondary to smooth, heterogeneous atherosclerotic plaque. 2. No significant atherosclerotic plaque or evidence of stenosis in the right internal carotid artery. 3. Vertebral arteries are patent with normal antegrade flow. Signed, Sterling Big, MD [facility] Electronically Signed   By: Malachy Moan M.D.   On: 12/18/2016 08:56       Subjective: Patient feeling better, chest pain has improved, persistent cough. No nausea or vomiting.   Discharge Exam: Vitals:   12/17/16 2141 12/18/16 0518  BP: (!) 156/73   Pulse: 89 74  Resp:  18  Temp: 98.3 F (36.8 C) 97.7 F (36.5 C)   Vitals:   12/17/16 2146 12/18/16 0518 12/18/16 0624 12/18/16 0731  BP:      Pulse:  74  Resp:  18    Temp:  97.7 F (36.5 C)    TempSrc:  Oral    SpO2:  97%  98%  Weight: 101.7 kg (224 lb 4.8 oz)  101.7 kg (224 lb 4.8 oz)   Height: 5\' 11"  (1.803 m)       General: Pt is alert, awake, not in acute distress ENT: no pallor or icterus, oral mucosa moist  Cardiovascular: RRR, S1/S2 +, no rubs, no gallops Respiratory: CTA bilaterally, no wheezing, no rhonchi Abdominal: Soft, NT, ND, bowel sounds + Extremities: no edema, no cyanosis    The results of significant diagnostics from this hospitalization (including imaging,  microbiology, ancillary and laboratory) are listed below for reference.     Microbiology: No results found for this or any previous visit (from the past 240 hour(s)).   Labs: BNP (last 3 results) No results for input(s): BNP in the last 8760 hours. Basic Metabolic Panel:  Recent Labs Lab 12/17/16 1708 12/18/16 0218  NA 143 142  K 3.7 4.0  CL 108 106  CO2 25 25  GLUCOSE 143* 219*  BUN 15 17  CREATININE 0.99 1.33*  CALCIUM 9.3 9.3   Liver Function Tests:  Recent Labs Lab 12/18/16 0218  AST 27  ALT 31  ALKPHOS 59  BILITOT 0.3  PROT 7.1  ALBUMIN 3.9   No results for input(s): LIPASE, AMYLASE in the last 168 hours. No results for input(s): AMMONIA in the last 168 hours. CBC:  Recent Labs Lab 12/17/16 1708  WBC 10.1  NEUTROABS 6.4  HGB 15.4  HCT 45.1  MCV 88.8  PLT 221   Cardiac Enzymes:  Recent Labs Lab 12/18/16 0217 12/18/16 0804  TROPONINI <0.03 <0.03   BNP: Invalid input(s): POCBNP CBG: No results for input(s): GLUCAP in the last 168 hours. D-Dimer No results for input(s): DDIMER in the last 72 hours. Hgb A1c No results for input(s): HGBA1C in the last 72 hours. Lipid Profile No results for input(s): CHOL, HDL, LDLCALC, TRIG, CHOLHDL, LDLDIRECT in the last 72 hours. Thyroid function studies No results for input(s): TSH, T4TOTAL, T3FREE, THYROIDAB in the last 72 hours.  Invalid input(s): FREET3 Anemia work up No results for input(s): VITAMINB12, FOLATE, FERRITIN, TIBC, IRON, RETICCTPCT in the last 72 hours. Urinalysis No results found for: COLORURINE, APPEARANCEUR, LABSPEC, PHURINE, GLUCOSEU, HGBUR, BILIRUBINUR, KETONESUR, PROTEINUR, UROBILINOGEN, NITRITE, LEUKOCYTESUR Sepsis Labs Invalid input(s): PROCALCITONIN,  WBC,  LACTICIDVEN Microbiology No results found for this or any previous visit (from the past 240 hour(s)).   Time coordinating discharge: 45 minutes  SIGNED:   Coralie Keens, MD  [facility] 12/18/2016,  1:38 PM Pager   If 7PM-7AM, please contact night-coverage [email or web address] Password [email or web address]

## 2016-12-18 NOTE — Progress Notes (Signed)
Bradley FountainMichael B Hays discharged Home per MD order.  Discharge instructions reviewed and discussed with the patient, all questions and concerns answered. Copy of instructions and scripts given to patient.  Allergies as of 12/18/2016      Reactions   No Known Allergies       Medication List    STOP taking these medications   GOODY HEADACHE PO   methocarbamol 500 MG tablet Commonly known as:  ROBAXIN   oxycodone 5 MG capsule Commonly known as:  OXY-IR   oxyCODONE 5 MG immediate release tablet Commonly known as:  Oxy IR/ROXICODONE     TAKE these medications   albuterol 108 (90 Base) MCG/ACT inhaler Commonly known as:  PROVENTIL HFA;VENTOLIN HFA Inhale 2 puffs into the lungs every 4 (four) hours as needed for wheezing or shortness of breath. What changed:  when to take this   aspirin EC 81 MG tablet Take 1 tablet (81 mg total) by mouth daily.   atorvastatin 40 MG tablet Commonly known as:  LIPITOR Take 1 tablet (40 mg total) by mouth daily.   clopidogrel 75 MG tablet Commonly known as:  PLAVIX Take 1 tablet (75 mg total) by mouth daily.   HYDROcodone-acetaminophen 5-325 MG tablet Commonly known as:  NORCO/VICODIN Take 1 tablet by mouth every 8 (eight) hours as needed for moderate pain.   ipratropium-albuterol 0.5-2.5 (3) MG/3ML Soln Commonly known as:  DUONEB Take 3 mLs by nebulization every 4 (four) hours as needed.   lisinopril 10 MG tablet Commonly known as:  PRINIVIL,ZESTRIL Take 1 tablet (10 mg total) by mouth daily.   metoprolol tartrate 25 MG tablet Commonly known as:  LOPRESSOR Take 1 tablet (25 mg total) by mouth 2 (two) times daily.   nitroGLYCERIN 0.4 MG SL tablet Commonly known as:  NITROSTAT Place 1 tablet (0.4 mg total) under the tongue as needed.   predniSONE 20 MG tablet Commonly known as:  DELTASONE Take 2 tablets (40 mg total) by mouth daily.   QUEtiapine 50 MG tablet Commonly known as:  SEROQUEL Take 50 mg by mouth at bedtime.            Durable Medical Equipment        Start     Ordered   12/18/16 0000  DME Nebulizer machine    Question:  Patient needs a nebulizer to treat with the following condition  Answer:  Asthma   12/18/16 1404      Patients skin is clean, dry and intact, no evidence of skin break down. IV site discontinued and catheter remains intact. Site without signs and symptoms of complications. Dressing and pressure applied.  No distress noted upon discharge.  Bradley Hays 12/18/2016 2:20 PM

## 2016-12-18 NOTE — Care Management Note (Signed)
Case Management Note  Patient Details  Name: Bradley FountainMichael B Hays MRN: [id number] Date of Birth: [birth year]  Subjective/Objective:                  Pt admitted with syncope. He is from home, lives with spouse. [place] and does not have PCP or cardiologist pta. Pt given list of PCP's accepting new pt's. Pt will be seen by cardiologist in hospital and will f/u with them after DC. Pt has insurance with drug coverage. Pt communicates no other needs.   Action/Plan: Plan for DC home with self care. CM will follow to DC.   Expected Discharge Date:    12/19/2016               Expected Discharge Plan:  Home/Self Care  In-House Referral:  NA  Discharge planning Services  CM Consult  Post Acute Care Choice:  NA Choice offered to:  NA  Status of Service:  Completed, signed off  Malcolm MetroChildress, Vernice Bowker Demske, RN 12/18/2016, 1:19 PM

## 2016-12-19 LAB — HIV ANTIBODY (ROUTINE TESTING W REFLEX): HIV Screen 4th Generation wRfx: NONREACTIVE

## 2017-04-16 ENCOUNTER — Encounter (INDEPENDENT_AMBULATORY_CARE_PROVIDER_SITE_OTHER): Payer: Self-pay | Admitting: Orthopedic Surgery

## 2017-04-16 ENCOUNTER — Ambulatory Visit (INDEPENDENT_AMBULATORY_CARE_PROVIDER_SITE_OTHER): Payer: Medicaid Other | Admitting: Orthopedic Surgery

## 2017-04-16 DIAGNOSIS — G8929 Other chronic pain: Secondary | ICD-10-CM | POA: Diagnosis not present

## 2017-04-16 DIAGNOSIS — M25511 Pain in right shoulder: Secondary | ICD-10-CM

## 2017-04-16 MED ORDER — HYDROCODONE-ACETAMINOPHEN 5-325 MG PO TABS: ORAL_TABLET | ORAL | 0 refills | Status: DC

## 2017-04-16 MED ORDER — LIDOCAINE HCL 1 % IJ SOLN
5.0000 mL | INTRAMUSCULAR | Status: AC | PRN
  Administered 2017-04-16: 5 mL

## 2017-04-16 MED ORDER — BUPIVACAINE HCL 0.5 % IJ SOLN
9.0000 mL | INTRAMUSCULAR | Status: AC | PRN
  Administered 2017-04-16: 9 mL via INTRA_ARTICULAR

## 2017-04-16 MED ORDER — METHYLPREDNISOLONE ACETATE 40 MG/ML IJ SUSP
40.0000 mg | INTRAMUSCULAR | Status: AC | PRN
  Administered 2017-04-16: 40 mg via INTRA_ARTICULAR

## 2017-04-16 NOTE — Progress Notes (Signed)
Office Visit Note   Patient: Bradley Hays           Date of Birth: 03-11-1971           MRN: [id number] Visit Date: 04/16/2017 Requested by: No referring provider defined for this encounter. PCP: Patient, No Pcp Per  Subjective: Chief Complaint  Patient presents with  . Right Shoulder - Pain    HPI: Borden is a 46 year old patient with a 2 month history of right shoulder pain.  He reports decreased range of motion.  He denies any radicular symptoms and denies numbness and tingling.  States that the pain is getting worse.  He is unable to raise arm.  He went to the hospital over the weekend where radiographs were negative for fracture by his report.  Patient is disabled.  He used to do painting in carpentry work.  He is taking ibuprofen and Tylenol 3.             ROS: All systems reviewed are negative as they relate to the chief complaint within the history of present illness.  Patient denies  fevers or chills.   Assessment & Plan: Visit Diagnoses:  1. Acute pain of right shoulder   2. Chronic right shoulder pain     Plan: impression is right shoulder pain possible bursitis or early frozen shoulder.  Plain radiographs normal by report.  Examination today shows reasonable strength and good motion.  He may have early frozen shoulder.  Plan is injection today and CT arthrogram.  He states he can't bathe and can't button his shirt.  Follow-Up Instructions: Return for after MRI.   Orders:  Orders Placed This Encounter  Procedures  . CT SHOULDER RIGHT W CONTRAST  . DG FLUORO GUIDED NEEDLE PLC ASPIRATION/INJECTION LOC   Meds ordered this encounter  Medications  . HYDROcodone-acetaminophen (NORCO/VICODIN) 5-325 MG tablet    Sig: 1 po q 8 prn    Dispense:  18 tablet    Refill:  0      Procedures: Large Joint Inj Date/Time: 04/16/2017 11:29 AM Performed by: Cammy Copa Authorized by: Cammy Copa   Consent Given by:  Patient Site marked: the procedure  site was marked   Timeout: prior to procedure the correct patient, procedure, and site was verified   Indications:  Pain and diagnostic evaluation Location:  Shoulder Site:  R subacromial bursa Prep: patient was prepped and draped in usual sterile fashion   Needle Size:  18 G Needle Length:  1.5 inches Approach:  Posterior Ultrasound Guidance: No   Fluoroscopic Guidance: No   Arthrogram: No   Medications:  5 mL lidocaine 1 %; 9 mL bupivacaine 0.5 %; 40 mg methylPREDNISolone acetate 40 MG/ML Aspiration Attempted: No   Patient tolerance:  Patient tolerated the procedure well with no immediate complications     Clinical Data: No additional findings.  Objective: Vital Signs: There were no vitals taken for this visit.  Physical Exam:   Constitutional: Patient appears well-developed HEENT:  Head: Normocephalic Eyes:EOM are normal Neck: Normal range of motion Cardiovascular: Normal rate Pulmonary/chest: Effort normal Neurologic: Patient is alert Skin: Skin is warm Psychiatric: Patient has normal mood and affect    Ortho Exam: rthopedic exam demonstrates normal cervical spine range of motion.  5 out of 5 grip EPL FPL interosseous wrist flexion and wrist extension ps triceps and deltoid strength.  Pain with passive range of motion above shoulder level.  No masses lymph adenopathy or skin changes noted  in the shoulder girdle region.  Rotator cuff strength intact to infraspinatus supraspinatus and subscapularis mus No acromioclavicular joint tenderness  Specialty Comments:  No specialty comments available.  Imaging: No results found.   PMFS History: Patient Active Problem List   Diagnosis Date Noted  . Chronic systolic CHF (congestive heart failure) (HCC) 12/17/2016  . Essential hypertension 12/17/2016  . Chest pain 12/17/2016  . Syncope 12/17/2016  . History of cocaine abuse 12/17/2016  . Arthritis 03/29/2016  . Pain medication agreement 03/29/2016  . History of wrist  fracture 03/01/2016  . Hx of long term use of blood thinners 03/01/2016  . Barrett's esophagus with dysplasia 02/01/2016  . Chronic pain of multiple joints 02/01/2016  . Coronary artery disease involving native coronary artery of native heart with angina pectoris (HCC) 02/01/2016  . Generalized anxiety disorder 02/01/2016  . Nocturia more than twice per night 02/01/2016  . Psychophysiological insomnia 02/01/2016  . Abnormal EKG 01/31/2016  . Dyspnea on exertion 01/31/2016  . Easy fatigability 01/31/2016  . Family history of premature CAD 01/31/2016  . S/P coronary artery stent placement 01/31/2016  . Accelerated hypertension with heart disease and without congestive heart failure 01/15/2016  . COPD (chronic obstructive pulmonary disease) (HCC) 01/15/2016  . Headache 01/15/2016  . Preoperative cardiovascular examination 04/02/2014  . Closed fracture of distal ends of radius and ulna, bilateral 04/02/2014  . Distal radius fracture 04/02/2014  . Fatty liver 10/27/2011  . RUQ pain 10/27/2011  . Nausea 10/27/2011  . Hypertension   . Hyperlipidemia LDL goal <70   . Tobacco abuse   . Gastroesophageal reflux disease   . Arteriosclerotic cardiovascular disease (ASCVD) 11/10/2009   Past Medical History:  Diagnosis Date  . 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 Asthma   . Cocaine abuse (HCC)    remote, quit 2010  . COPD (chronic obstructive pulmonary disease) (HCC)   . Gastroesophageal reflux disease   . Hyperlipidemia    lipid profile in 2011:185, 169, 37, 114; 2012: Total cholesterol of 311 and triglycerides of 1730  . Hypertension   . MI, old    x 2    . Tobacco abuse    40 pack years; 0.5 PPD  . 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 Heart disease Unknown   . Arthritis Unknown   . Lung disease Unknown   . Cancer Unknown   . Asthma Unknown   . Kidney disease Unknown   . Hypertension Mother   . Cancer Father   . Heart disease Father   . Hypertension Father     Past Surgical History:  Procedure Laterality Date  . APPENDECTOMY  2009  . CORONARY STENT PLACEMENT  2011   bare metal  . EYE SURGERY    . KNEE ARTHROSCOPY    . NISSEN FUNDOPLICATION  1992  . ORIF WRIST FRACTURE Bilateral 04/02/2014   Procedure: OPEN REDUCTION INTERNAL FIXATION (ORIF) WRIST FRACTURE AND CARPAL TUNNEL RELEASES;  Surgeon: Dominica SeverinWilliam Gramig, MD;  Location: [facility] OR;  Service: Orthopedics;  Laterality: Bilateral;  . SHOULDER ARTHROSCOPY WITH DEBRIDEMENT AND BICEP  TENDON REPAIR Left 10/10/2016   Procedure: LEFT SHOULDER ARTHROSCOPY WITH DEBRIDEMENT AND BICEPS TENODESIS;  Surgeon: Cammy Copa, MD;  Location: [facility] OR;  Service: Orthopedics;  Laterality: Left;   Social History   Occupational History  . unemployed x 1 yr, Personal assistant    Social History Main Topics  . Smoking status: Current Every Day Smoker    Packs/day: 0.50    Years: 26.00    Types: Cigarettes  . Smokeless tobacco: Never Used  . Alcohol use Yes     Comment: occasionally; reports not very often   . Drug use: No     Comment: remote cocaine, quit 2003  . Sexual activity: Not on file

## 2017-05-02 ENCOUNTER — Telehealth (INDEPENDENT_AMBULATORY_CARE_PROVIDER_SITE_OTHER): Payer: Self-pay | Admitting: Orthopedic Surgery

## 2017-05-02 MED ORDER — HYDROCODONE-ACETAMINOPHEN 5-325 MG PO TABS: ORAL_TABLET | ORAL | 0 refills | Status: AC

## 2017-05-02 NOTE — Telephone Encounter (Signed)
Please advise. Thanks.  

## 2017-05-02 NOTE — Telephone Encounter (Signed)
Patient called asking for a refill on hyrdrocodone until his MRI on the 19th. CB # [phone]

## 2017-05-02 NOTE — Addendum Note (Signed)
Addended byPrescott Parma: Modestine Scherzinger on: 05/02/2017 10:29 AM   Modules accepted: Orders

## 2017-05-02 NOTE — Telephone Encounter (Signed)
IC advised could pick up at front desk.  

## 2017-05-02 NOTE — Telephone Encounter (Signed)
Ok

## 2017-05-14 ENCOUNTER — Other Ambulatory Visit: Payer: Self-pay

## 2017-05-14 ENCOUNTER — Inpatient Hospital Stay
Admission: RE | Admit: 2017-05-14 | Discharge: 2017-05-14 | Disposition: A | Discharge status: Home / Self Care | Payer: Self-pay | Source: Ambulatory Visit | Attending: Orthopedic Surgery | Admitting: Orthopedic Surgery

## 2017-05-15 ENCOUNTER — Telehealth (INDEPENDENT_AMBULATORY_CARE_PROVIDER_SITE_OTHER): Payer: Self-pay | Admitting: Orthopedic Surgery

## 2017-05-15 MED ORDER — HYDROCODONE-ACETAMINOPHEN 5-325 MG PO TABS: 1.0000 | ORAL_TABLET | Freq: Two times a day (BID) | ORAL | 0 refills | Status: AC | PRN

## 2017-05-15 NOTE — Telephone Encounter (Signed)
Ok for that stuff pls call thx

## 2017-05-15 NOTE — Addendum Note (Signed)
Addended byPrescott Parma: Javious Hallisey on: 05/15/2017 03:04 PM   Modules accepted: Orders

## 2017-05-15 NOTE — Telephone Encounter (Signed)
rx will be signed by Dr August Saucerean in the morning since he is not in clinic right now. Patient will pickup then. He said that his arthrogram was r/s to 12/04 but will need re-authorization. Can you please help with this? Thank you so much.

## 2017-05-15 NOTE — Telephone Encounter (Signed)
Patient called about a few things:  1. Patient was called on Friday to make sure he was off of Plavix for 5 days prior to his CT scan and he wasnt so he wanted to make sure you knew why he cancelled/rescheduled.   2. Because he had to reschedule, they need another authorization from our office to do another CT scan.  3. Patient requests a RX refill on Hydrocodone 5 mg for shoulder pain since they moved his appt until 12/4.   CB# is [phone]

## 2017-05-15 NOTE — Telephone Encounter (Signed)
Please review and advise.

## 2017-05-16 ENCOUNTER — Ambulatory Visit (INDEPENDENT_AMBULATORY_CARE_PROVIDER_SITE_OTHER): Payer: Medicaid Other | Admitting: Orthopedic Surgery

## 2017-05-21 NOTE — Telephone Encounter (Signed)
New authorization is [id number] valid 05/18/17-06/01/17, Case #[id number]

## 2017-05-29 ENCOUNTER — Other Ambulatory Visit: Payer: Self-pay

## 2017-05-29 ENCOUNTER — Inpatient Hospital Stay: Admission: RE | Admit: 2017-05-29 | Payer: Self-pay | Source: Ambulatory Visit

## 2017-06-01 ENCOUNTER — Ambulatory Visit (INDEPENDENT_AMBULATORY_CARE_PROVIDER_SITE_OTHER): Payer: Medicaid Other | Admitting: Orthopedic Surgery

## 2017-06-04 ENCOUNTER — Ambulatory Visit (INDEPENDENT_AMBULATORY_CARE_PROVIDER_SITE_OTHER): Payer: Medicaid Other | Admitting: Orthopedic Surgery

## 2017-06-11 ENCOUNTER — Inpatient Hospital Stay: Admission: RE | Admit: 2017-06-11 | Payer: Self-pay | Source: Ambulatory Visit

## 2017-12-19 ENCOUNTER — Ambulatory Visit (INDEPENDENT_AMBULATORY_CARE_PROVIDER_SITE_OTHER): Payer: Self-pay

## 2017-12-19 ENCOUNTER — Encounter (INDEPENDENT_AMBULATORY_CARE_PROVIDER_SITE_OTHER): Payer: Self-pay | Admitting: Orthopedic Surgery

## 2017-12-19 ENCOUNTER — Ambulatory Visit (INDEPENDENT_AMBULATORY_CARE_PROVIDER_SITE_OTHER): Payer: Medicaid Other | Admitting: Orthopedic Surgery

## 2017-12-19 DIAGNOSIS — M4807 Spinal stenosis, lumbosacral region: Secondary | ICD-10-CM | POA: Diagnosis not present

## 2017-12-19 DIAGNOSIS — M25552 Pain in left hip: Secondary | ICD-10-CM

## 2017-12-19 NOTE — Progress Notes (Signed)
Office Visit Note   Patient: Bradley FountainMichael B Hays           Date of Birth: [birth year]           MRN: [id number] Visit Date: 12/19/2017 Requested by: No referring provider defined for this encounter. PCP: Patient, No Pcp Per  Subjective: Chief Complaint  Patient presents with  . Left Hip - Pain    HPI: Bradley Hays is a patient with left hip and back pain.'s been going on for months.  Denies any history of injury.  He states when he was in his 6220s he fell off a roof had back pain since that time.  He does have a history of having his back worked up in the past and it sounds like 7 years ago he had facet joint nerve ablation.  He is currently on disability.  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  He is a smoker and has not seen a dentist in some time.              ROS: All systems reviewed are negative as they relate to the chief complaint within the history of present illness.  Patient denies  fevers or chills.   Assessment & Plan: Visit Diagnoses:  1. Pain in left hip   2. Spinal stenosis of lumbosacral region     Plan: Impression is left hip arthritis based on exam and facet arthritis in the back with history of prior nerve ablation.  Plan is to refer him to Dr. Alvester MorinNewton for left hip injection for diagnostic and therapeutic purposes.  He also need CT myelogram of the lumbar spine to evaluate low back pain and potential for possible fusion.  I do not think he will have a fusion less stop smoking.  This is explained to him.  He also needs to have dental work done prior to hip replacement and that is also explained to him.  Follow-Up Instructions: No follow-ups on file.   Orders:  Orders Placed This Encounter  Procedures  . XR HIP UNILAT W OR W/O PELVIS 2-3 VIEWS LEFT  . XR Lumbar Spine 2-3 Views  . CT LUMBAR SPINE W CONTRAST  . DG Myelogram Lumbar  . Ambulatory referral to Physical Medicine Rehab   No orders of the defined types were placed in  this encounter.     Procedures: No procedures performed   Clinical Data: No additional findings.  Objective: Vital Signs: There were no vitals taken for this visit.  Physical Exam:   Constitutional: Patient appears well-developed HEENT:  Head: Normocephalic Eyes:EOM are normal Neck: Normal range of motion Cardiovascular: Normal rate Pulmonary/chest: Effort normal Neurologic: Patient is alert Skin: Skin is warm Psychiatric: Patient has normal mood and affect    Ortho Exam: Ortho exam demonstrates Trendelenburg gait to the left.  There is groin pain with internal/external rotation of the left hip but not the right.  There are no nerve root tension signs.  Pedal pulses are palpable.  Patient has no nerve root tension signs and no paresthesias L1-S1 bilaterally.  Reflexes 0 to 1+ out of 4 bilateral patella and Achilles.  Some pain with forward and lateral bending.  Specialty Comments:  No specialty comments available.  Imaging: Xr Hip Unilat W Or W/o Pelvis 2-3 Views Left  Result Date: 12/19/2017 AP pelvis lateral left hip reviewed.  There is mild spurring on the femoral head and anterolateral acetabulum.  No bone-on-bone  contact present.  Sacroiliac joint normal on the left and mild sclerosis is present on the right    Xr Lumbar Spine 2-3 Views  Result Date: 12/19/2017 AP lateral lumbar spine reviewed.  Significant facet arthritis is present at L4-5 and L5-S1.  Intradiscal spaces maintained.  No spondylolisthesis or compression fractures.    PMFS History: Patient Active Problem List   Diagnosis Date Noted  . Chronic systolic CHF (congestive heart failure) (HCC) 12/17/2016  . Essential hypertension 12/17/2016  . Chest pain 12/17/2016  . Syncope 12/17/2016  . History of cocaine abuse 12/17/2016  . Arthritis 03/29/2016  . Pain medication agreement 03/29/2016  . History of wrist fracture 03/01/2016  . Hx of long term use of blood thinners 03/01/2016  . Barrett's  esophagus with dysplasia 02/01/2016  . Chronic pain of multiple joints 02/01/2016  . Coronary artery disease involving native coronary artery of native heart with angina pectoris (HCC) 02/01/2016  . Generalized anxiety disorder 02/01/2016  . Nocturia more than twice per night 02/01/2016  . Psychophysiological insomnia 02/01/2016  . Abnormal EKG 01/31/2016  . Dyspnea on exertion 01/31/2016  . Easy fatigability 01/31/2016  . Family history of premature CAD 01/31/2016  . S/P coronary artery stent placement 01/31/2016  . Accelerated hypertension with heart disease and without congestive heart failure 01/15/2016  . COPD (chronic obstructive pulmonary disease) (HCC) 01/15/2016  . Headache 01/15/2016  . Preoperative cardiovascular examination 04/02/2014  . Closed fracture of distal ends of radius and ulna, bilateral 04/02/2014  . Distal radius fracture 04/02/2014  . Fatty liver 10/27/2011  . RUQ pain 10/27/2011  . Nausea 10/27/2011  . Hypertension   . Hyperlipidemia LDL goal <70   . Tobacco abuse   . Gastroesophageal reflux disease   . Arteriosclerotic cardiovascular disease (ASCVD) 11/10/2009   Past Medical History:  Diagnosis Date  . 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 Asthma   . Cocaine abuse (HCC)    remote, quit 2010  . COPD (chronic obstructive pulmonary disease) (HCC)   . Gastroesophageal reflux disease   . Hyperlipidemia    lipid profile in 2011:185, 169, 37, 114; 2012: Total cholesterol of 311 and triglycerides of 1730  . Hypertension   . 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 Tobacco abuse    40 pack years; 0.5 PPD  . Trauma to eye    Retained projectile  on left    Family History  Problem Relation Age of Onset  . Heart disease Unknown   . Arthritis Unknown   . Lung disease Unknown   . Cancer Unknown   . Asthma Unknown   . Kidney disease Unknown   . Hypertension Mother   . Cancer Father   . Heart disease Father   . Hypertension Father     Past Surgical History:  Procedure Laterality Date  . APPENDECTOMY  2009  . CORONARY STENT PLACEMENT  2011   bare metal  . EYE SURGERY    . KNEE ARTHROSCOPY    . NISSEN FUNDOPLICATION  1992  . ORIF WRIST FRACTURE Bilateral 04/02/2014   Procedure: OPEN REDUCTION INTERNAL FIXATION (ORIF) WRIST FRACTURE AND CARPAL TUNNEL RELEASES;  Surgeon:  Dominica Severin, MD;  Location: [facility] OR;  Service: Orthopedics;  Laterality: Bilateral;  . SHOULDER ARTHROSCOPY WITH DEBRIDEMENT AND BICEP TENDON REPAIR Left 10/10/2016   Procedure: LEFT SHOULDER ARTHROSCOPY WITH DEBRIDEMENT AND BICEPS TENODESIS;  Surgeon: Cammy Copa, MD;  Location: [facility] OR;  Service: Orthopedics;  Laterality: Left;   Social History   Occupational History  . Occupation: unemployed x 1 yr, Personal assistant  Tobacco Use  . Smoking status: Current Every Day Smoker    Packs/day: 0.50    Years: 26.00    Pack years: 13.00    Types: Cigarettes  . Smokeless tobacco: Never Used  Substance and Sexual Activity  . Alcohol use: Yes    Comment: occasionally; reports not very often   . Drug use: No    Comment: remote cocaine, quit 2003  . Sexual activity: Not on file

## 2017-12-26 ENCOUNTER — Telehealth (INDEPENDENT_AMBULATORY_CARE_PROVIDER_SITE_OTHER): Payer: Self-pay | Admitting: Orthopedic Surgery

## 2017-12-26 NOTE — Telephone Encounter (Signed)
Please advise. Thanks.  

## 2017-12-26 NOTE — Telephone Encounter (Signed)
Patient is asking something be called in for pain for him. He is not scheduled with Dr. Alvester MorinNewton for the injection until 7/26 and he said he cant tolerate the pain much longer. [facility] [place] Patients CB # [phone]

## 2018-01-18 ENCOUNTER — Ambulatory Visit (INDEPENDENT_AMBULATORY_CARE_PROVIDER_SITE_OTHER): Payer: Self-pay | Admitting: Physical Medicine and Rehabilitation

## 2018-01-23 ENCOUNTER — Telehealth: Payer: Self-pay | Admitting: Nurse Practitioner

## 2018-01-23 NOTE — Telephone Encounter (Signed)
Phone call to patient to verify medication list and allergies for myelogram procedure. Pt instructed to stop taking seroquel 48hrs prior to myelogram appointment time. Pt verbalized understanding. 

## 2018-02-05 ENCOUNTER — Inpatient Hospital Stay: Admission: RE | Admit: 2018-02-05 | Payer: Self-pay | Source: Ambulatory Visit

## 2018-02-05 ENCOUNTER — Other Ambulatory Visit: Payer: Self-pay

## 2018-02-11 ENCOUNTER — Ambulatory Visit (INDEPENDENT_AMBULATORY_CARE_PROVIDER_SITE_OTHER): Payer: Self-pay | Admitting: Physical Medicine and Rehabilitation

## 2018-02-12 ENCOUNTER — Ambulatory Visit (INDEPENDENT_AMBULATORY_CARE_PROVIDER_SITE_OTHER): Payer: Self-pay

## 2018-02-12 ENCOUNTER — Ambulatory Visit (INDEPENDENT_AMBULATORY_CARE_PROVIDER_SITE_OTHER): Payer: Medicaid Other | Admitting: Physical Medicine and Rehabilitation

## 2018-02-12 ENCOUNTER — Encounter (INDEPENDENT_AMBULATORY_CARE_PROVIDER_SITE_OTHER): Payer: Self-pay | Admitting: Physical Medicine and Rehabilitation

## 2018-02-12 DIAGNOSIS — M25552 Pain in left hip: Secondary | ICD-10-CM

## 2018-02-12 NOTE — Progress Notes (Signed)
Bradley FountainMichael B Hays - 47 y.o. male MRN [id number]  Date of birth: 08/06/1970  Office Visit Note: Visit Date: 02/12/2018 PCP: Patient, No Pcp Per Referred by: No ref. provider found  Subjective: Chief Complaint  Patient presents with  . Left Hip - Pain   HPI: Mr. Tanya Nonesickard is a 47 year old gentleman who comes today at the request of Dr. Burnard BuntingG. Scott Dean for diagnostic and therapeutic left hip anesthetic arthrogram.  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  He has a history of back problems as well.  He endorses 6 out of 10 pain which is mostly in the left low back and lateral hip but no groin pain.  This is been ongoing for a year.  He says laying down with his legs elevated basis pain better.  Dr. August Saucerean has images of his lumbar spine pending.  We will complete the injection of the left hip today.   ROS Otherwise per HPI.  Assessment & Plan: Visit Diagnoses:  1. Pain in left hip     Plan: Findings:  Diagnostic note for therapeutic anesthetic hip arthrogram on the left did not provide much relief during the anesthetic phase.    Meds & Orders: No orders of the defined types were placed in this encounter.   Orders Placed This Encounter  Procedures  . Large Joint Inj: L hip joint  . XR C-ARM NO REPORT    Follow-up: Return if symptoms worsen or fail to improve.   Procedures: Large Joint Inj: L hip joint on 02/12/2018 2:30 PM Indications: pain and diagnostic evaluation Details: 22 G needle, anterior approach  Arthrogram: Yes  Medications: 3 mL bupivacaine 0.5 %; 80 mg triamcinolone acetonide 40 MG/ML Outcome: tolerated well, no immediate complications  Arthrogram demonstrated excellent flow of contrast throughout the joint surface without extravasation or obvious defect.  The patient did not have relief of symptoms during the anesthetic phase of the injection.  Procedure, treatment alternatives, risks and benefits explained,  specific risks discussed. Consent was given by the patient. Immediately prior to procedure a time out was called to verify the correct patient, procedure, equipment, support staff and site/side marked as required. Patient was prepped and draped in the usual sterile fashion.      No notes on file   Clinical History: No specialty comments available.   He reports that he has been smoking cigarettes. He has a 13.00 pack-year smoking history. He has never used smokeless tobacco. No results for input(s): HGBA1C, LABURIC in the last 8760 hours.  Objective:  VS:  HT:    WT:   BMI:     BP:   HR: bpm  TEMP: ( )  RESP:  Physical Exam  Ortho Exam Imaging: No results found.  Past Medical/Family/Surgical/Social History: Medications & Allergies reviewed per EMR, new medications updated. Patient Active Problem List   Diagnosis Date Noted  . Chronic systolic CHF (congestive heart failure) (HCC) 12/17/2016  . Essential hypertension 12/17/2016  . Chest pain 12/17/2016  . Syncope 12/17/2016  . History of cocaine abuse 12/17/2016  . Arthritis 03/29/2016  . Pain medication agreement 03/29/2016  . History of wrist fracture 03/01/2016  . Hx of long term use of blood thinners 03/01/2016  . Barrett's esophagus with dysplasia 02/01/2016  . Chronic pain of multiple joints 02/01/2016  . Coronary artery disease involving native coronary artery of native heart with angina pectoris (HCC) 02/01/2016  . Generalized anxiety disorder 02/01/2016  .  Nocturia more than twice per night 02/01/2016  . Psychophysiological insomnia 02/01/2016  . Abnormal EKG 01/31/2016  . Dyspnea on exertion 01/31/2016  . Easy fatigability 01/31/2016  . Family history of premature CAD 01/31/2016  . S/P coronary artery stent placement 01/31/2016  . Accelerated hypertension with heart disease and without congestive heart failure 01/15/2016  . COPD (chronic obstructive pulmonary disease) (HCC) 01/15/2016  . Headache 01/15/2016  .  Preoperative cardiovascular examination 04/02/2014  . Closed fracture of distal ends of radius and ulna, bilateral 04/02/2014  . Distal radius fracture 04/02/2014  . Fatty liver 10/27/2011  . RUQ pain 10/27/2011  . Nausea 10/27/2011  . Hypertension   . Hyperlipidemia LDL goal <70   . Tobacco abuse   . Gastroesophageal reflux disease   . Arteriosclerotic cardiovascular disease (ASCVD) 11/10/2009   Past Medical History:  Diagnosis Date  . 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 Asthma   . Cocaine abuse (HCC)    remote, quit 2010  . COPD (chronic obstructive pulmonary disease) (HCC)   . Gastroesophageal reflux disease   . Hyperlipidemia    lipid profile in 2011:185, 169, 37, 114; 2012: Total cholesterol of 311 and triglycerides of 1730  . Hypertension   . 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 Tobacco abuse    40 pack years; 0.5 PPD  . Trauma to eye    Retained projectile on left   Family History  Problem Relation Age of Onset  . Heart disease Unknown   . Arthritis Unknown   . Lung disease Unknown   . Cancer Unknown   . Asthma Unknown   . Kidney disease Unknown   . Hypertension Mother   . Cancer Father   . Heart disease Father   . Hypertension Father    Past Surgical History:  Procedure Laterality Date  . APPENDECTOMY  2009  . CORONARY STENT PLACEMENT  2011   bare metal  . EYE SURGERY    . KNEE ARTHROSCOPY    . NISSEN FUNDOPLICATION  1992  . ORIF WRIST FRACTURE Bilateral 04/02/2014   Procedure: OPEN REDUCTION INTERNAL FIXATION (ORIF) WRIST FRACTURE AND CARPAL TUNNEL RELEASES;  Surgeon: Dominica SeverinWilliam Gramig, MD;  Location: [facility] OR;  Service: Orthopedics;   Laterality: Bilateral;  . SHOULDER ARTHROSCOPY WITH DEBRIDEMENT AND BICEP TENDON REPAIR Left 10/10/2016   Procedure: LEFT SHOULDER ARTHROSCOPY WITH DEBRIDEMENT AND BICEPS TENODESIS;  Surgeon: Cammy CopaScott Gregory Dean, MD;  Location: [facility] OR;  Service: Orthopedics;  Laterality: Left;   Social History   Occupational History  . Occupation: unemployed x 1 yr, Personal assistantconstruction/painter  Tobacco Use  . Smoking status: Current Every Day Smoker    Packs/day: 0.50    Years: 26.00    Pack years: 13.00    Types: Cigarettes  . Smokeless tobacco: Never Used  Substance and Sexual Activity  . Alcohol use: Yes    Comment: occasionally; reports not very often   . Drug use: No    Comment: remote cocaine, quit 2003  . Sexual activity: Not on file

## 2018-02-12 NOTE — Patient Instructions (Signed)

## 2018-02-12 NOTE — Progress Notes (Signed)
 .  Numeric Pain Rating Scale and Functional Assessment Average Pain 6   In the last MONTH (on 0-10 scale) has pain interfered with the following?  1. General activity like being  able to carry out your everyday physical activities such as walking, climbing stairs, carrying groceries, or moving a chair?  Rating(2)   -Dye Allergies.  

## 2018-02-13 ENCOUNTER — Ambulatory Visit
Admission: RE | Admit: 2018-02-13 | Discharge: 2018-02-13 | Disposition: A | Discharge status: Home / Self Care | Payer: Medicaid Other | Source: Ambulatory Visit | Attending: Orthopedic Surgery | Admitting: Orthopedic Surgery

## 2018-02-13 DIAGNOSIS — M4807 Spinal stenosis, lumbosacral region: Secondary | ICD-10-CM

## 2018-02-13 MED ORDER — DIAZEPAM 5 MG PO TABS
10.0000 mg | ORAL_TABLET | Freq: Once | ORAL | Status: AC
  Administered 2018-02-13: 10 mg via ORAL

## 2018-02-13 MED ORDER — ONDANSETRON HCL 4 MG/2ML IJ SOLN: 4.0000 mg | Freq: Four times a day (QID) | INTRAMUSCULAR | Status: DC | PRN

## 2018-02-13 MED ORDER — MEPERIDINE HCL 100 MG/ML IJ SOLN
75.0000 mg | Freq: Once | INTRAMUSCULAR | Status: AC
  Administered 2018-02-13: 75 mg via INTRAMUSCULAR

## 2018-02-13 MED ORDER — IOPAMIDOL (ISOVUE-M 200) INJECTION 41%
20.0000 mL | Freq: Once | INTRAMUSCULAR | Status: AC
  Administered 2018-02-13: 20 mL via INTRATHECAL

## 2018-02-13 MED ORDER — ONDANSETRON HCL 4 MG/2ML IJ SOLN
4.0000 mg | Freq: Once | INTRAMUSCULAR | Status: AC
  Administered 2018-02-13: 4 mg via INTRAMUSCULAR

## 2018-02-13 NOTE — Discharge Instructions (Signed)

## 2018-02-18 ENCOUNTER — Encounter (INDEPENDENT_AMBULATORY_CARE_PROVIDER_SITE_OTHER): Payer: Self-pay | Admitting: Orthopedic Surgery

## 2018-02-18 ENCOUNTER — Ambulatory Visit (INDEPENDENT_AMBULATORY_CARE_PROVIDER_SITE_OTHER): Payer: Medicaid Other | Admitting: Orthopedic Surgery

## 2018-02-18 ENCOUNTER — Telehealth (INDEPENDENT_AMBULATORY_CARE_PROVIDER_SITE_OTHER): Payer: Self-pay | Admitting: Orthopedic Surgery

## 2018-02-18 DIAGNOSIS — M545 Low back pain: Secondary | ICD-10-CM | POA: Diagnosis not present

## 2018-02-18 DIAGNOSIS — G8929 Other chronic pain: Secondary | ICD-10-CM

## 2018-02-18 MED ORDER — OXYCODONE HCL 5 MG PO TABS: ORAL_TABLET | ORAL | 0 refills | Status: DC

## 2018-02-18 NOTE — Telephone Encounter (Signed)
Patient called advised [facility] is [facility] and was told it need prior approval thru medicaid . Patient said he lives in [place] and will wait to get the Rx. The number to contact patient is [phone]

## 2018-02-18 NOTE — Progress Notes (Signed)
Office Visit Note   Patient: Bradley Hays           Date of Birth: 25-Jan-1971           MRN: [id number] Visit Date: 02/18/2018 Requested by: No referring provider defined for this encounter. PCP: Patient, No Pcp Per  Subjective: Chief Complaint  Patient presents with  . Lower Back - Follow-up    HPI: [name] presents for follow-up of his CT scan.  The scan is reviewed and it shows progressive severe right facet arthrosis at L4-5 with trace anterolisthesis and moderate bilateral neuroforaminal stenosis.  Patient also has chronic severe right facet arthrosis at L3-4 with moderate to severe right and mild left neuroforaminal stenosis.  He reports continued pain which is primarily in his back.  He did have a left hip injection which gave him 2 to 3 days of relief but again most of his pain occurs from sitting to standing and it is severe.  He has been checked by the dentist who has given him the green light that he does not have any active infection or does not require any teeth to be pulled.  He also has not had any cigarettes over the last 5 days.              ROS: All systems reviewed are negative as they relate to the chief complaint within the history of present illness.  Patient denies  fevers or chills.   Assessment & Plan: Visit Diagnoses: No diagnosis found.  Plan: Impression is bilateral back pain with right-sided arthritis in the facet joints worse but he is having mostly left-sided symptoms.  His hip examination is pretty benign.  Plan would be referral to back surgeon for surgical evaluation.  One-time prescription for oxycodone written.  I will see him back as needed.  He has had RF ablation on the facet joints over at [facility].  This did not give him relief and injections in general have not given him relief in the past.  He is interested in pursuing surgical intervention.  Follow-Up Instructions: No follow-ups on file.   Orders:  No orders of the  defined types were placed in this encounter.  No orders of the defined types were placed in this encounter.     Procedures: No procedures performed   Clinical Data: No additional findings.  Objective: Vital Signs: There were no vitals taken for this visit.  Physical Exam:   Constitutional: Patient appears well-developed HEENT:  Head: Normocephalic Eyes:EOM are normal Neck: Normal range of motion Cardiovascular: Normal rate Pulmonary/chest: Effort normal Neurologic: Patient is alert Skin: Skin is warm Psychiatric: Patient has normal mood and affect    Ortho Exam: Ortho exam demonstrates no real groin pain with internal/external rotation of either leg.  Pedal pulses palpable.  No nerve root tension signs today.  Reflexes symmetric.  No muscle atrophy in the legs.  Does have pain with forward and lateral bending which localizes to the L4-5 region.  Specialty Comments:  No specialty comments available.  Imaging: No results found.   PMFS History: Patient Active Problem List   Diagnosis Date Noted  . Chronic systolic CHF (congestive heart failure) (HCC) 12/17/2016  . Essential hypertension 12/17/2016  . Chest pain 12/17/2016  . Syncope 12/17/2016  . History of cocaine abuse 12/17/2016  . Arthritis 03/29/2016  . Pain medication agreement 03/29/2016  . History of wrist fracture 03/01/2016  . Hx of long term use of blood thinners 03/01/2016  .  Barrett's esophagus with dysplasia 02/01/2016  . Chronic pain of multiple joints 02/01/2016  . Coronary artery disease involving native coronary artery of native heart with angina pectoris (HCC) 02/01/2016  . Generalized anxiety disorder 02/01/2016  . Nocturia more than twice per night 02/01/2016  . Psychophysiological insomnia 02/01/2016  . Abnormal EKG 01/31/2016  . Dyspnea on exertion 01/31/2016  . Easy fatigability 01/31/2016  . Family history of premature CAD 01/31/2016  . S/P coronary artery stent placement 01/31/2016    . Accelerated hypertension with heart disease and without congestive heart failure 01/15/2016  . COPD (chronic obstructive pulmonary disease) (HCC) 01/15/2016  . Headache 01/15/2016  . Preoperative cardiovascular examination 04/02/2014  . Closed fracture of distal ends of radius and ulna, bilateral 04/02/2014  . Distal radius fracture 04/02/2014  . Fatty liver 10/27/2011  . RUQ pain 10/27/2011  . Nausea 10/27/2011  . Hypertension   . Hyperlipidemia LDL goal <70   . Tobacco abuse   . Gastroesophageal reflux disease   . Arteriosclerotic cardiovascular disease (ASCVD) 11/10/2009   Past Medical History:  Diagnosis Date  . 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 Asthma   . Cocaine abuse (HCC)    remote, quit 2010  . COPD (chronic obstructive pulmonary disease) (HCC)   . Gastroesophageal reflux disease   . Hyperlipidemia    lipid profile in 2011:185, 169, 37, 114; 2012: Total cholesterol of 311 and triglycerides of 1730  . Hypertension   . 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 Tobacco abuse    40 pack years; 0.5 PPD  . 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 Heart disease Unknown   . Arthritis Unknown   . Lung disease Unknown   . Cancer Unknown   . Asthma Unknown   . Kidney disease Unknown   . Hypertension Mother   . Cancer Father   . Heart disease Father   . Hypertension Father     Past Surgical History:  Procedure Laterality Date  . APPENDECTOMY  2009  . CORONARY STENT PLACEMENT  2011   bare metal  . EYE SURGERY    . KNEE ARTHROSCOPY    . NISSEN FUNDOPLICATION  1992  . ORIF WRIST FRACTURE  Bilateral 04/02/2014   Procedure: OPEN REDUCTION INTERNAL FIXATION (ORIF) WRIST FRACTURE AND CARPAL TUNNEL RELEASES;  Surgeon: Dominica Severin, MD;  Location: [facility] OR;  Service: Orthopedics;  Laterality: Bilateral;  . SHOULDER ARTHROSCOPY WITH DEBRIDEMENT AND BICEP TENDON REPAIR Left 10/10/2016   Procedure: LEFT SHOULDER ARTHROSCOPY WITH DEBRIDEMENT AND BICEPS TENODESIS;  Surgeon: Cammy Copa, MD;  Location: [facility] OR;  Service: Orthopedics;  Laterality: Left;   Social History   Occupational History  . Occupation: unemployed x 1 yr, Personal assistant  Tobacco Use  . Smoking status: Current Every Day Smoker    Packs/day: 0.50    Years: 26.00    Pack years: 13.00    Types: Cigarettes  . Smokeless tobacco: Never Used  Substance and Sexual Activity  . Alcohol use: Yes    Comment: occasionally; reports not very often   .  Drug use: No    Comment: remote cocaine, quit 2003  . Sexual activity: Not on file

## 2018-02-18 NOTE — Telephone Encounter (Signed)
Confirmation #: [id number]   Prior Approval #: [id number]: [id number]   IC and s/w patient and advised I was able to do this PA for him this time but in the future to allow 24-48hr turn around time for this as this is not something that is typically done same day.

## 2018-02-21 MED ORDER — TRIAMCINOLONE ACETONIDE 40 MG/ML IJ SUSP
80.0000 mg | INTRAMUSCULAR | Status: AC | PRN
  Administered 2018-02-12: 80 mg via INTRA_ARTICULAR

## 2018-02-21 MED ORDER — BUPIVACAINE HCL 0.5 % IJ SOLN
3.0000 mL | INTRAMUSCULAR | Status: AC | PRN
  Administered 2018-02-12: 3 mL via INTRA_ARTICULAR

## 2018-02-28 ENCOUNTER — Telehealth (INDEPENDENT_AMBULATORY_CARE_PROVIDER_SITE_OTHER): Payer: Self-pay | Admitting: Orthopedic Surgery

## 2018-02-28 MED ORDER — OXYCODONE HCL 5 MG PO TABS: ORAL_TABLET | ORAL | 0 refills | Status: AC

## 2018-02-28 MED ORDER — OXYCODONE HCL 5 MG PO TABS: ORAL_TABLET | ORAL | 0 refills | Status: DC

## 2018-02-28 NOTE — Telephone Encounter (Signed)
Patient called requesting an RX refill on the Oxycodone. He is requesting enough pills to last until he sees Dr. Ophelia Charter on the 10th.  Please advise patient if Dr. August Saucer will or will not fill the prescription.  CB#[phone].  Thank you.

## 2018-02-28 NOTE — Addendum Note (Signed)
Addended byPrescott Parma on: 02/28/2018 12:04 PM   Modules accepted: Orders

## 2018-02-28 NOTE — Telephone Encounter (Signed)
IC advised could pick up at front desk.  

## 2018-02-28 NOTE — Telephone Encounter (Signed)
25 on 8/26 ok for 25 more 1 po bid prn

## 2018-02-28 NOTE — Telephone Encounter (Signed)
Please advise. Thanks.  

## 2018-03-05 ENCOUNTER — Ambulatory Visit (INDEPENDENT_AMBULATORY_CARE_PROVIDER_SITE_OTHER): Payer: Medicaid Other | Admitting: Orthopaedic Surgery

## 2018-03-05 ENCOUNTER — Encounter (INDEPENDENT_AMBULATORY_CARE_PROVIDER_SITE_OTHER): Payer: Self-pay | Admitting: Orthopaedic Surgery

## 2018-03-05 VITALS — BP 184/112 | HR 90 | Ht 71.0 in | Wt 223.0 lb

## 2018-03-05 DIAGNOSIS — M545 Low back pain: Secondary | ICD-10-CM

## 2018-03-05 DIAGNOSIS — G8929 Other chronic pain: Secondary | ICD-10-CM

## 2018-03-06 NOTE — Progress Notes (Signed)
Office Visit Note   Patient: Bradley Hays           Date of Birth: 04-26-1971           MRN: [id number] Visit Date: 03/05/2018              Requested by: No referring provider defined for this encounter. PCP: Patient, No Pcp Per   Assessment & Plan: Visit Diagnoses:  1. Chronic bilateral low back pain without sciatica     Plan: We will refer patient for lower extremity ABIs.  I discussed with him that resumption of chronic narcotic medication is not recommended by me.  He is having significant pain in his back and hips with standing with more left than right side pain and foraminal stenosis is worse on the opposite right side than left side.  No central stenosis on myelogram CT scan.  Office follow-up after ABIs.  Follow-Up Instructions: No follow-ups on file.   Orders:  No orders of the defined types were placed in this encounter.  No orders of the defined types were placed in this encounter.     Procedures: No procedures performed   Clinical Data: No additional findings.   Subjective: Chief Complaint  Patient presents with  . Lower Back - Pain    HPI 47 year old male sent to me by Dr. Dorene Grebe for evaluation of low back pain worse in the left and right.  Pain radiates into his buttocks and he has pain when he turns or bends.  Pain does not radiate past his hips.  Denies numbness or tingling in his feet he has difficulty sleeping states he is tired of hurting and he cannot get comfortable.  He is had a prescription for oxycodone 5 mg which he was taking twice a day which he states helped.  [place] website review 301 narcotics, and 180 sedative, with risk score 410.  Back in 2017 he was getting 90 tablets of percocet 5/325 every month.  He said bowel bladder symptoms no fever or chills.  R milligrams CT scan showed some progressive right facet arthrosis with trace anterolisthesis moderate neuroforaminal narrowing.  Right facet arthrosis at L3-4 with severe right and mild  left neuroforaminal narrowing.  Mild right and left narrowing L5-S1.  Aortic atherosclerosis.  No central stenosis no disc herniation.  Is worse when he stands.  Is requesting more Percocet.  Review of Systems past history of carpal tunnel releases with ORIF distal radius fracture.  Previous left shoulder arthroscopy biceps tenodesis 2018.  Lipidemia hypertension tobacco abuse, OPD, coronary artery disease with stent placement, hypertension.  Past history of pain medication agreement.   Objective: Vital Signs: BP (!) 184/112   Pulse 90   Ht 5\' 11"  (1.803 m)   Wt 223 lb (101.2 kg)   BMI 31.10 kg/m   Physical Exam  Constitutional: He is oriented to person, place, and time. He appears well-developed and well-nourished.  HENT:  Head: Normocephalic and atraumatic.  Eyes: Pupils are equal, round, and reactive to light. EOM are normal.  Neck: No tracheal deviation present. No thyromegaly present.  Cardiovascular: Normal rate.  Pulmonary/Chest: Effort normal. He has no wheezes.  Abdominal: Soft. Bowel sounds are normal.  Neurological: He is alert and oriented to person, place, and time.  Skin: Skin is warm and dry. Capillary refill takes less than 2 seconds.  Psychiatric: He has a normal mood and affect. His behavior is normal. Judgment and thought content normal.    Ortho Exam  patient has negative straight leg raising.  Negative logroll to the hips knee and ankle jerk are intact anterior tib gastrocsoleus heel and toe walking is intact.  He complains of pain when he bends turns or twists the lumbar spine.  Negative Faber test.  Specialty Comments:  No specialty comments available.  Imaging: CLINICAL DATA:  Chronic low back pain radiating to the buttocks, lateral hips, groin, and anterolateral thighs, left greater than right.  EXAM: LUMBAR MYELOGRAM  FLUOROSCOPY TIME:  Radiation Exposure Index (as provided by the fluoroscopic device): 264.67 microGray*m^2  Fluoroscopy Time (in  minutes and seconds):  18 seconds  PROCEDURE: After thorough discussion of risks and benefits of the procedure including bleeding, infection, injury to nerves, blood vessels, adjacent structures as well as headache and CSF leak, written and oral informed consent was obtained. Consent was obtained by Dr. Sebastian Ache. Time out form was completed.  Patient was positioned prone on the fluoroscopy table. Local anesthesia was provided with 1% lidocaine without epinephrine after prepped and draped in the usual sterile fashion. Puncture was performed at L3-4 using a 5 inch 22-gauge spinal needle via a right interlaminar approach. Using a single pass through the dura, the needle was placed within the thecal sac, with return of clear CSF. 15 mL of Isovue M-200 was injected into the thecal sac, with normal opacification of the nerve roots and cauda equina consistent with free flow within the subarachnoid space.  I personally performed the lumbar puncture and administered the intrathecal contrast. I also personally supervised acquisition of the myelogram images.  TECHNIQUE: Contiguous axial images were obtained through the Lumbar spine after the intrathecal infusion of infusion. Coronal and sagittal reconstructions were obtained of the axial image sets.  COMPARISON:  Lumbar spine radiograph 12/19/2017. CT abdomen and pelvis 04/02/2014. Lumbar spine CT 07/30/2007.  FINDINGS: LUMBAR MYELOGRAM FINDINGS:  There are 5 non rib-bearing lumbar type vertebrae. Trace anterolisthesis of L4 on L5 is most notable with flexion and reduces with extension. Small ventral extradural defects are larger at L4-5 than at L3-4 and do not result in significant spinal stenosis. No nerve root sleeve cut off is seen. Severe right-sided facet hypertrophy is noted at L3-4 and L4-5.  CT LUMBAR MYELOGRAM FINDINGS:  Trace anterolisthesis of L4 on L5 is new from 2009. No fracture is identified. A 5 mm  sclerotic focus in the L5 vertebral body is unchanged from 2015 though is new from 2009. The conus medullaris terminates at L1. Abdominal aortic atherosclerosis is noted without aneurysm.  T12-L1: Mild disc space narrowing and progressive anterior endplate spurring compared to 2009. A diminutive central disc protrusion and mild facet arthrosis are present without stenosis.  L1-2: Mild right and moderate left facet spurring without disc herniation or stenosis, unchanged from 2009.  L2-3: Mild facet hypertrophy without disc herniation or stenosis, unchanged from 2009.  L3-4: Disc bulging and severe right facet hypertrophy result in moderate to severe right and mild left neural foraminal stenosis without spinal stenosis, unchanged from 2009.  L4-5: Progressive, severe right facet and mild left facet arthrosis and mild disc bulging result and moderate bilateral neural foraminal stenosis without spinal stenosis.  L5-S1: Mild disc bulging, endplate spurring, and mild facet arthrosis result in mild right and moderate left neural foraminal stenosis, mildly progressed from 2009. No spinal stenosis.  IMPRESSION: 1. Progressive, severe right facet arthrosis at L4-5 with trace anterolisthesis and moderate bilateral neural foraminal stenosis. 2. Chronic severe right facet arthrosis at L3-4 with moderate to severe right  and mild left neural foraminal stenosis. 3. Mild right and moderate left neural foraminal stenosis at L5-S1, mildly progressed from 2009. 4.  Aortic Atherosclerosis (ICD10-I70.0).   Electronically Signed   By: Sebastian Ache M.D.   On: 02/13/2018 16:49   PMFS History: Patient Active Problem List   Diagnosis Date Noted  . Chronic systolic CHF (congestive heart failure) (HCC) 12/17/2016  . Essential hypertension 12/17/2016  . Chest pain 12/17/2016  . Syncope 12/17/2016  . History of cocaine abuse 12/17/2016  . Arthritis 03/29/2016  . Pain medication agreement  03/29/2016  . History of wrist fracture 03/01/2016  . Hx of long term use of blood thinners 03/01/2016  . Barrett's esophagus with dysplasia 02/01/2016  . Chronic pain of multiple joints 02/01/2016  . Coronary artery disease involving native coronary artery of native heart with angina pectoris (HCC) 02/01/2016  . Generalized anxiety disorder 02/01/2016  . Nocturia more than twice per night 02/01/2016  . Psychophysiological insomnia 02/01/2016  . Abnormal EKG 01/31/2016  . Dyspnea on exertion 01/31/2016  . Easy fatigability 01/31/2016  . Family history of premature CAD 01/31/2016  . S/P coronary artery stent placement 01/31/2016  . Accelerated hypertension with heart disease and without congestive heart failure 01/15/2016  . COPD (chronic obstructive pulmonary disease) (HCC) 01/15/2016  . Headache 01/15/2016  . Preoperative cardiovascular examination 04/02/2014  . Closed fracture of distal ends of radius and ulna, bilateral 04/02/2014  . Distal radius fracture 04/02/2014  . Fatty liver 10/27/2011  . RUQ pain 10/27/2011  . Nausea 10/27/2011  . Hypertension   . Hyperlipidemia LDL goal <70   . Tobacco abuse   . Gastroesophageal reflux disease   . Arteriosclerotic cardiovascular disease (ASCVD) 11/10/2009   Past Medical History:  Diagnosis Date  . 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 Asthma   . Cocaine abuse (HCC)    remote, quit 2010  . COPD (chronic obstructive pulmonary disease) (HCC)   . Gastroesophageal reflux disease   . Hyperlipidemia    lipid profile in 2011:185, 169, 37, 114; 2012: Total cholesterol of 311 and triglycerides of 1730  .  Hypertension   . 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 Tobacco abuse    40 pack years; 0.5 PPD  . 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 Heart disease Unknown   . Arthritis Unknown   . Lung disease Unknown   . Cancer Unknown   . Asthma Unknown   . Kidney disease Unknown   . Hypertension Mother   . Cancer Father   . Heart disease Father   . Hypertension Father     Past Surgical History:  Procedure Laterality Date  . APPENDECTOMY  2009  . CORONARY STENT PLACEMENT  2011   bare metal  . EYE SURGERY    . KNEE ARTHROSCOPY    . NISSEN FUNDOPLICATION  1992  . ORIF WRIST FRACTURE Bilateral 04/02/2014   Procedure: OPEN REDUCTION INTERNAL FIXATION (ORIF) WRIST FRACTURE AND CARPAL TUNNEL RELEASES;  Surgeon: Dominica Severin, MD;  Location: [facility] OR;  Service: Orthopedics;  Laterality: Bilateral;  .  SHOULDER ARTHROSCOPY WITH DEBRIDEMENT AND BICEP TENDON REPAIR Left 10/10/2016   Procedure: LEFT SHOULDER ARTHROSCOPY WITH DEBRIDEMENT AND BICEPS TENODESIS;  Surgeon: Cammy Copa, MD;  Location: [facility] OR;  Service: Orthopedics;  Laterality: Left;   Social History   Occupational History  . Occupation: unemployed x 1 yr, Personal assistant  Tobacco Use  . Smoking status: Current Every Day Smoker    Packs/day: 0.50    Years: 26.00    Pack years: 13.00    Types: Cigarettes  . Smokeless tobacco: Never Used  Substance and Sexual Activity  . Alcohol use: Yes    Comment: occasionally; reports not very often   . Drug use: No    Comment: remote cocaine, quit 2003  . Sexual activity: Not on file

## 2018-03-07 ENCOUNTER — Encounter (INDEPENDENT_AMBULATORY_CARE_PROVIDER_SITE_OTHER): Payer: Self-pay | Admitting: Orthopaedic Surgery

## 2018-03-12 ENCOUNTER — Ambulatory Visit (HOSPITAL_COMMUNITY): Payer: Medicaid Other | Attending: Orthopaedic Surgery

## 2018-07-11 IMAGING — DX DG CHEST 2V
2 series · 2 of 2 positions shown · non-contrast
Comparison: 02/06/2015

CLINICAL DATA: Central chest pain

EXAM:
CHEST  2 VIEW

[chest pa]
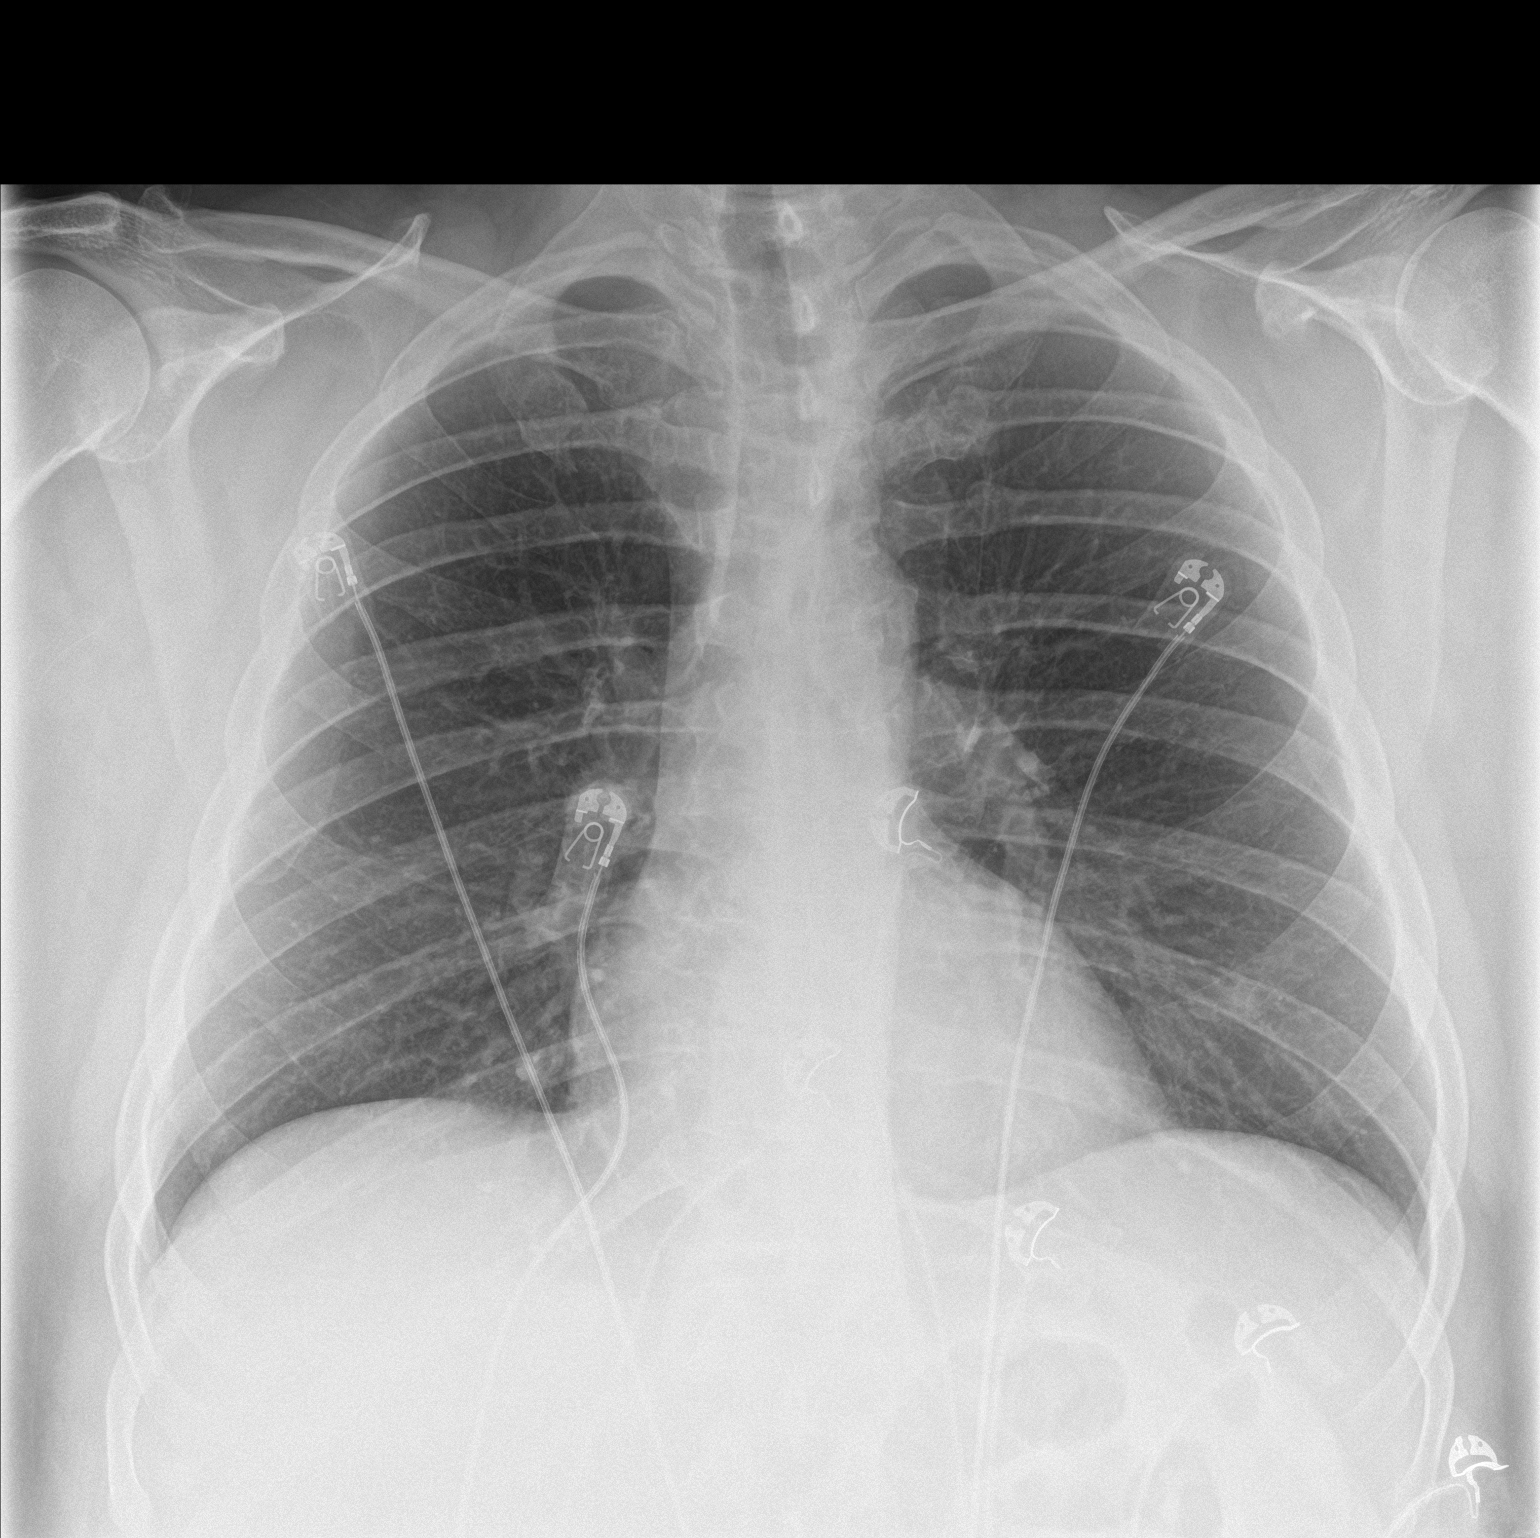

[chest lat]
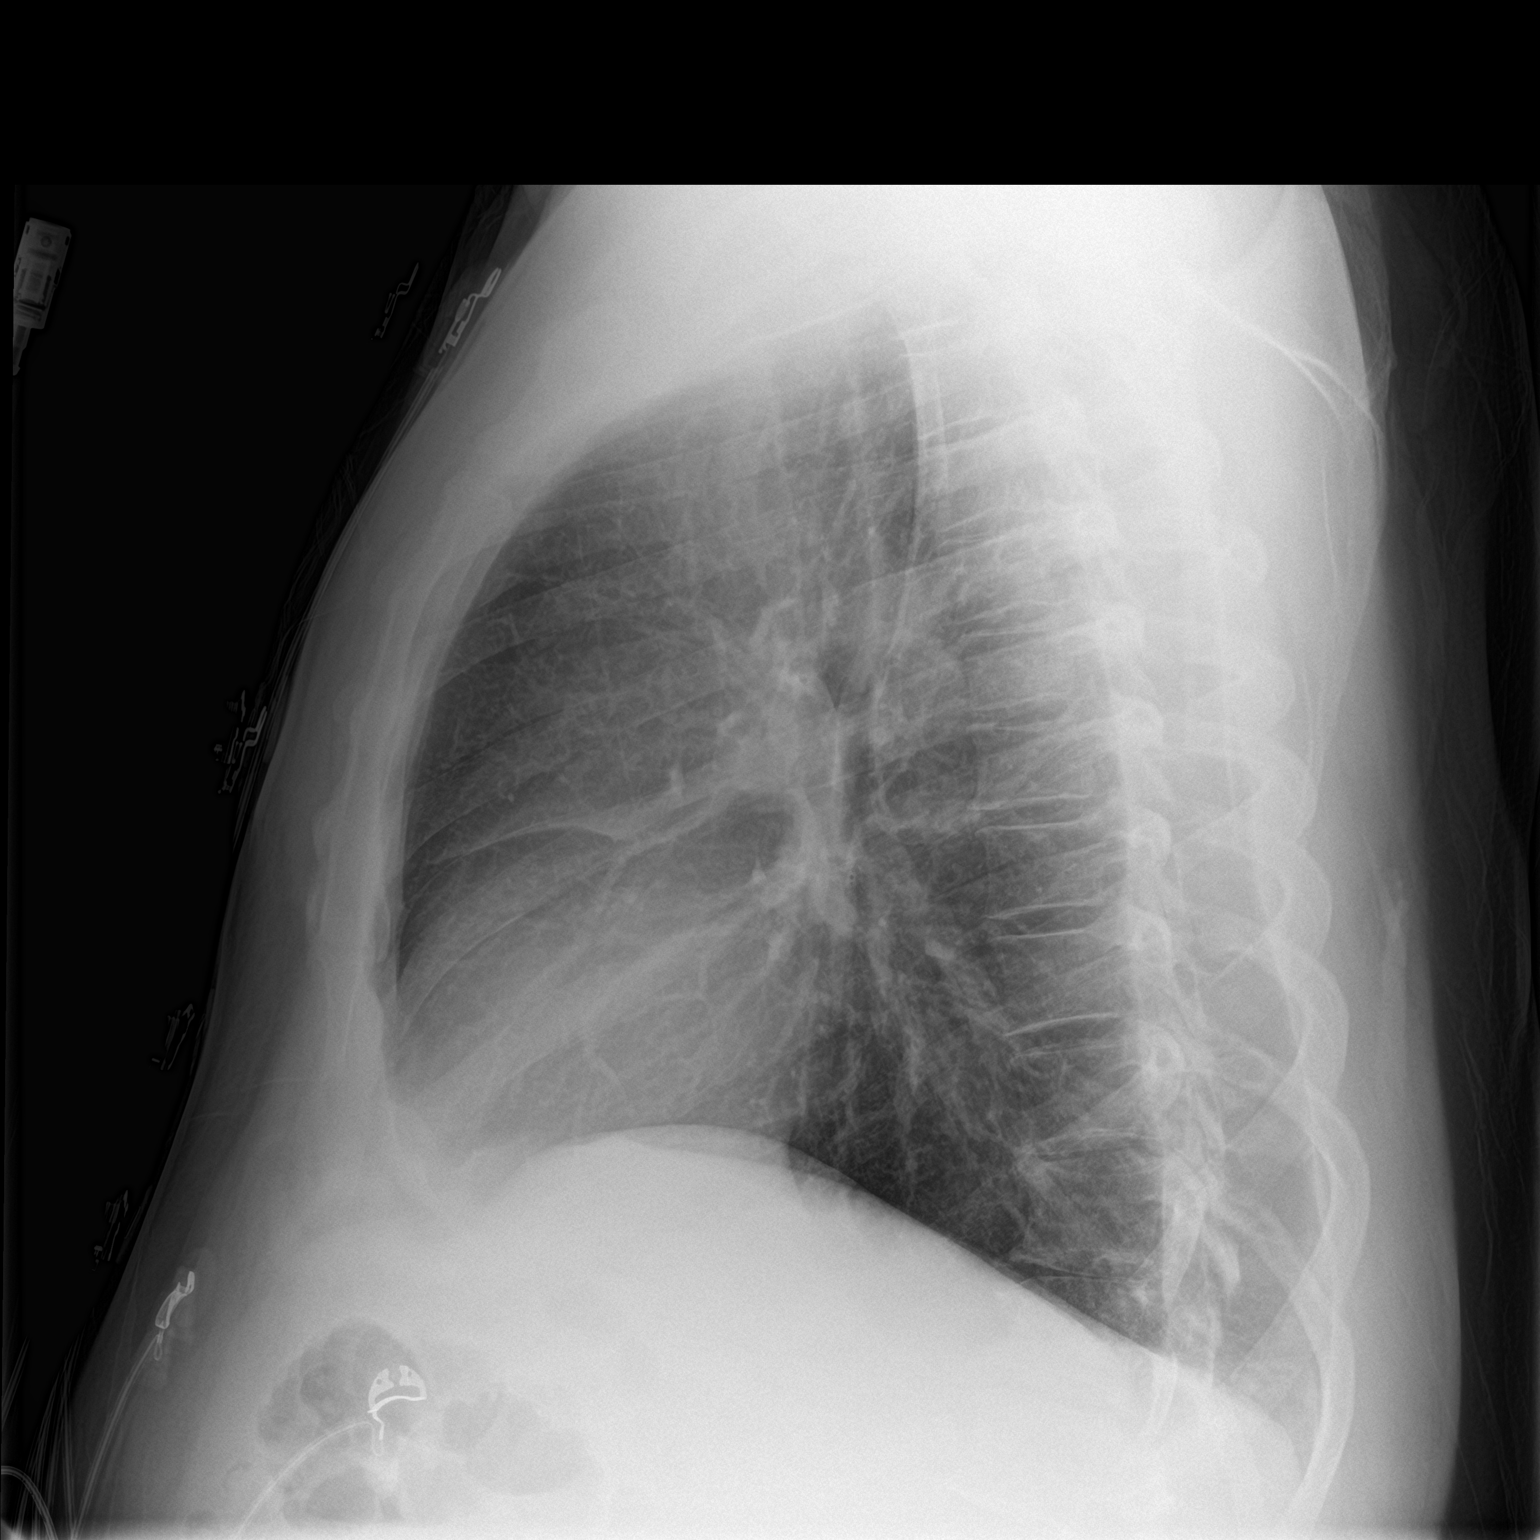

[2 of 2 positions shown; findings below may reference images not displayed]

FINDINGS: Normal heart size and mediastinal contours. No acute infiltrate or
edema. No effusion or pneumothorax. No acute osseous findings.
IMPRESSION: Negative chest.

## 2018-07-11 IMAGING — CT CT HEAD W/O CM
3 series · 16 of 47 positions shown, 19 images · non-contrast
Comparison: 10/18/2013

CLINICAL DATA: Dizziness for 1 day

EXAM:
CT HEAD WITHOUT CONTRAST
TECHNIQUE: Contiguous axial images were obtained from the base of the skull
through the vertex without intravenous contrast.

[Series 2: head wo · axial · 0.44mm/px · z∈[+10,+140]mm · 10 of 32 slices shown, 13 images]
[im 3/32  brain]
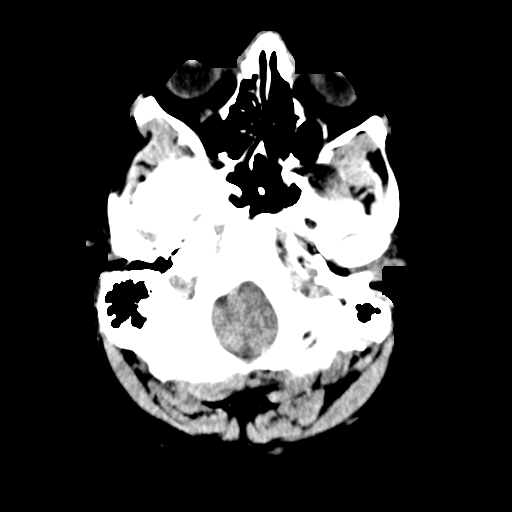
[im 3/32  bone]
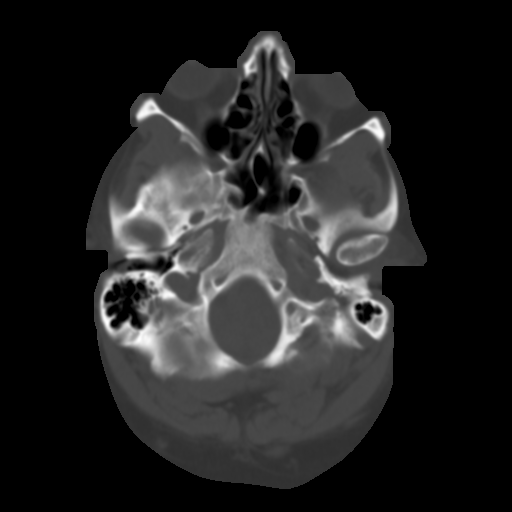
[im 6/32  brain]
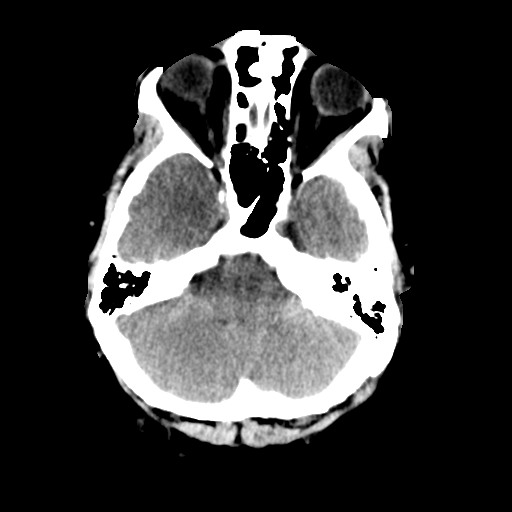
[im 9/32  brain]
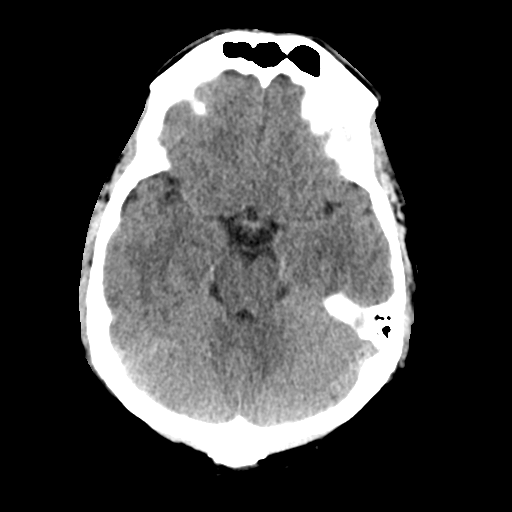
[im 11/32  brain]
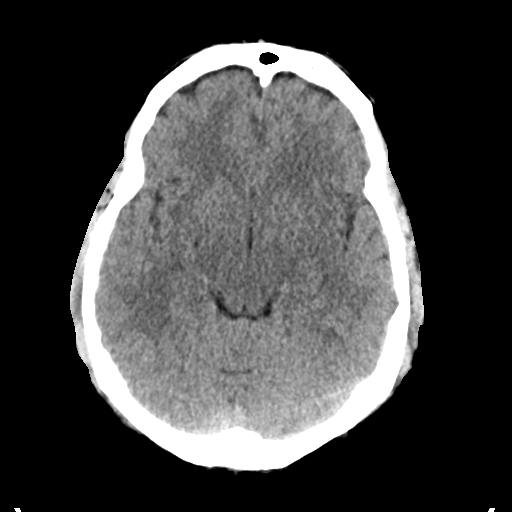
[im 14/32  brain]
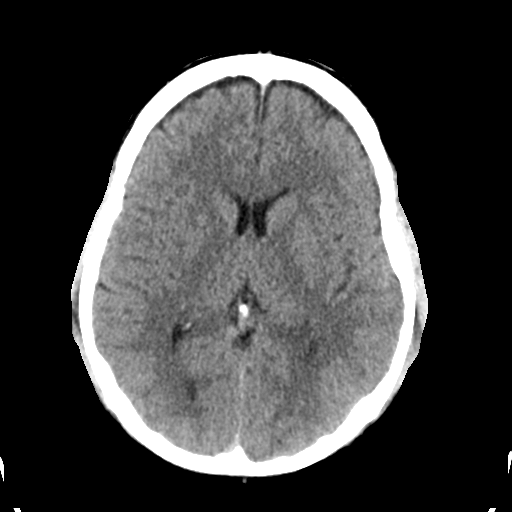
[im 14/32  bone]
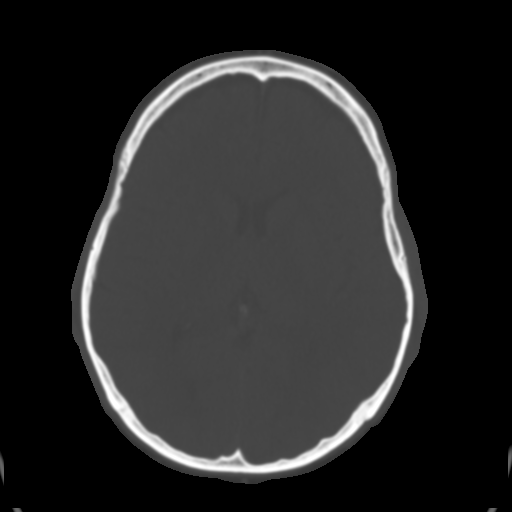
[im 18/32  brain]
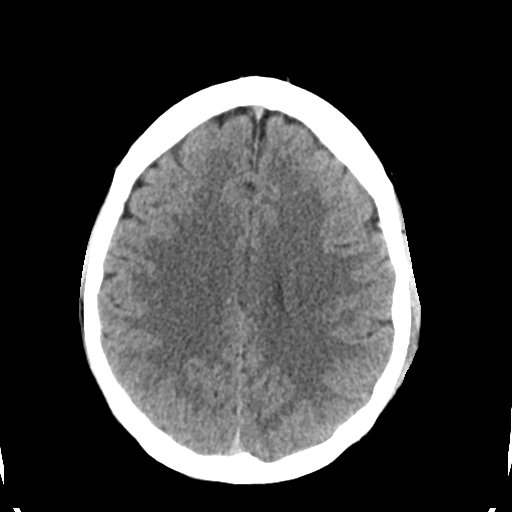
[im 21/32  brain]
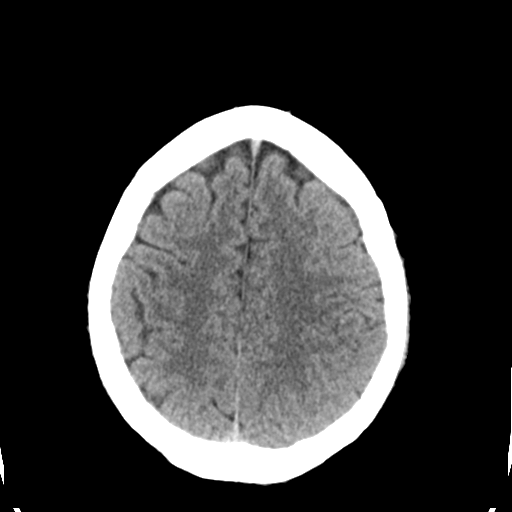
[im 24/32  brain]
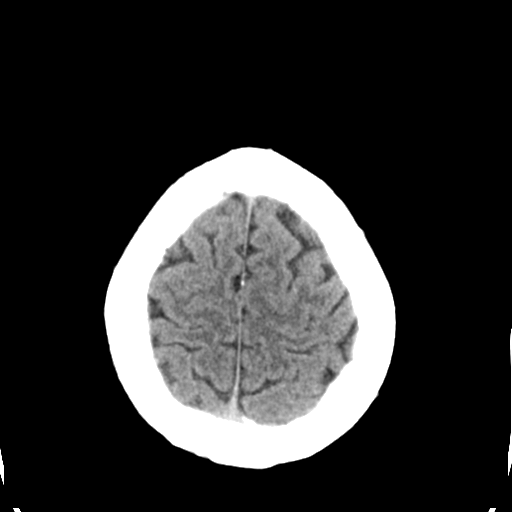
[im 26/32  brain]
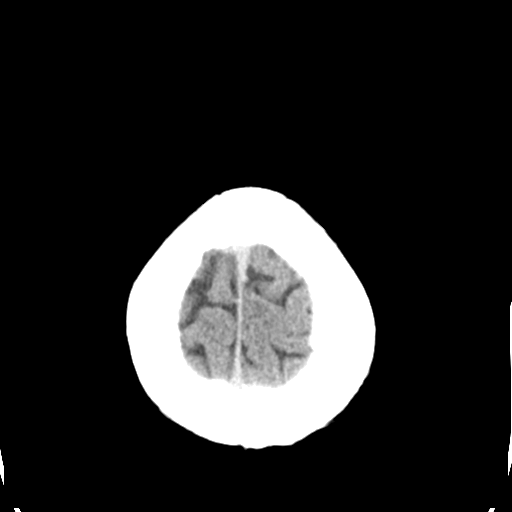
[im 26/32  bone]
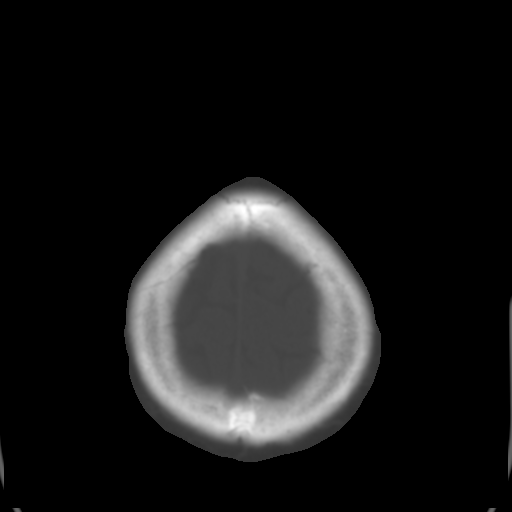
[im 29/32  brain]
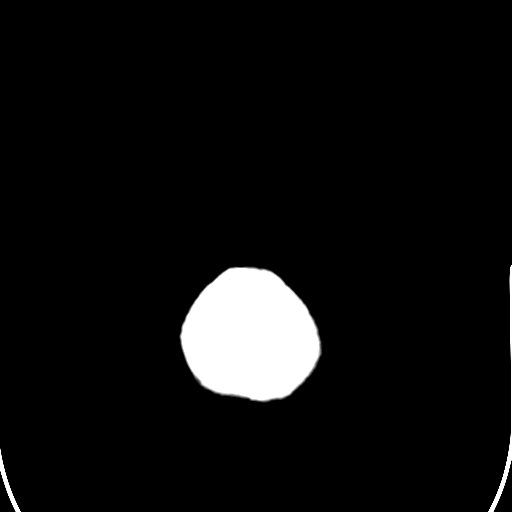

[Series 4: coronal soft tissue · coronal · 0.33mm/px · 3 of 68 slices shown]
[im 23/68  brain]
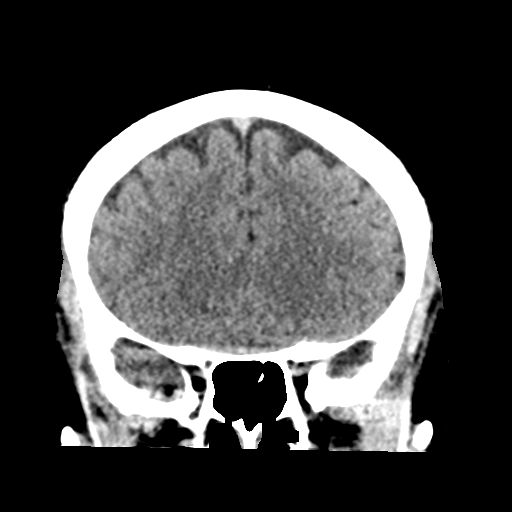
[im 30/68  brain]
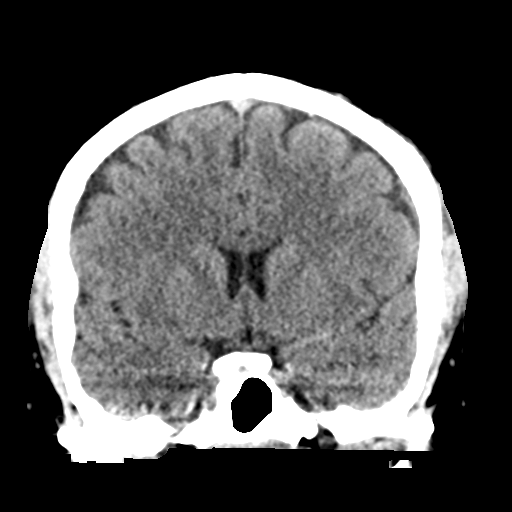
[im 38/68  brain]
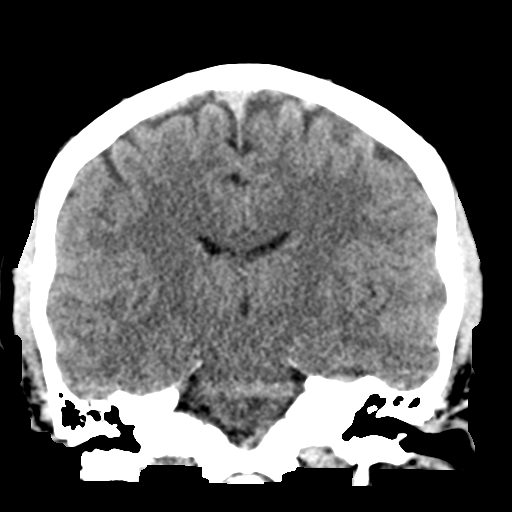

[Series 5: sagittal soft tissue · sagittal · 0.36mm/px · 3 of 64 slices shown]
[im 22/64  brain]
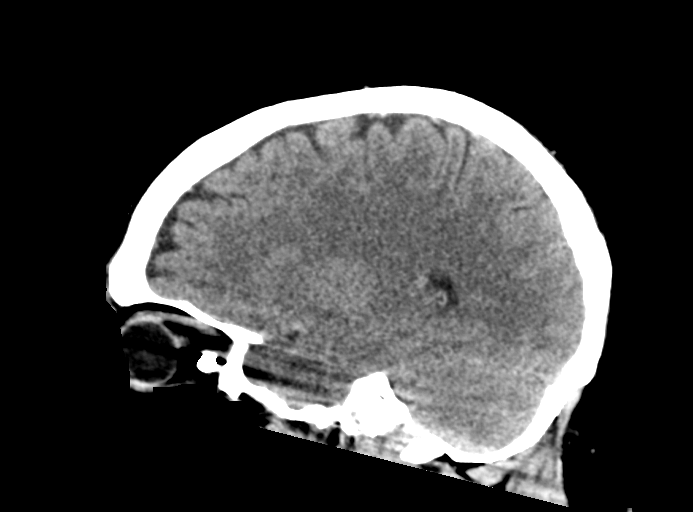
[im 32/64  brain]
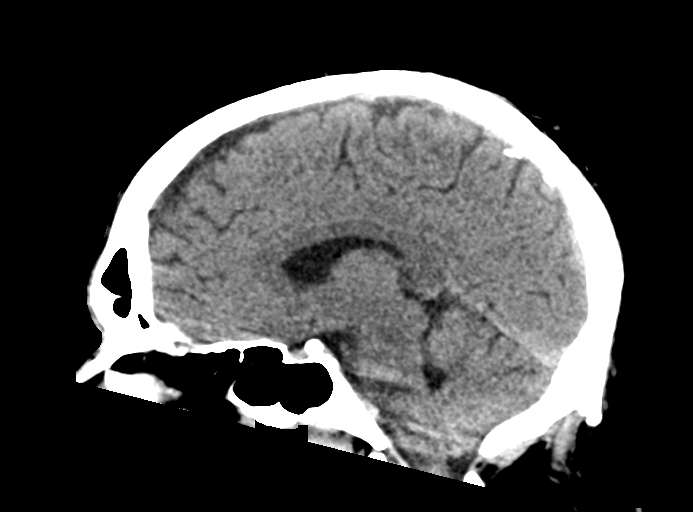
[im 43/64  brain]
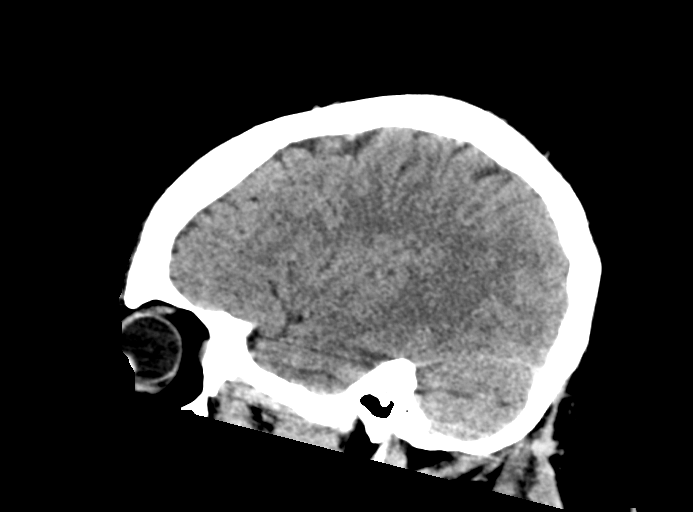

[16 of 47 positions shown; findings below may reference images not displayed]

FINDINGS: Brain: No evidence of acute infarction, hemorrhage, hydrocephalus,
extra-axial collection or mass lesion/mass effect.

Vascular: No hyperdense vessel or unexpected calcification.

Skull: Normal. Negative for fracture or focal lesion.

Sinuses/Orbits: Radiopaque foreign body is again noted in the
posterior aspect of the right orbit stable from the prior exam.

Other: None.
IMPRESSION: No acute intracranial abnormality is noted.

Stable foreign body in the posterior aspect of the right orbit.
